# Patient Record
Sex: Female | Born: 1970 | Race: White | Hispanic: No | Marital: Married | Smoking: Never smoker
Health system: Southern US, Community
[De-identification: ages and names within clinical notes are randomized; demographics above are authoritative.]

## PROBLEM LIST (undated history)

## (undated) DIAGNOSIS — I2510 Atherosclerotic heart disease of native coronary artery without angina pectoris: Secondary | ICD-10-CM

## (undated) DIAGNOSIS — I700 Atherosclerosis of aorta: Secondary | ICD-10-CM

## (undated) DIAGNOSIS — G4730 Sleep apnea, unspecified: Secondary | ICD-10-CM

## (undated) DIAGNOSIS — I428 Other cardiomyopathies: Secondary | ICD-10-CM

## (undated) DIAGNOSIS — G4733 Obstructive sleep apnea (adult) (pediatric): Secondary | ICD-10-CM

## (undated) DIAGNOSIS — R51 Headache: Secondary | ICD-10-CM

## (undated) DIAGNOSIS — G43909 Migraine, unspecified, not intractable, without status migrainosus: Secondary | ICD-10-CM

## (undated) DIAGNOSIS — Z7902 Long term (current) use of antithrombotics/antiplatelets: Secondary | ICD-10-CM

## (undated) DIAGNOSIS — E538 Deficiency of other specified B group vitamins: Secondary | ICD-10-CM

## (undated) DIAGNOSIS — Z9884 Bariatric surgery status: Secondary | ICD-10-CM

## (undated) DIAGNOSIS — M1990 Unspecified osteoarthritis, unspecified site: Secondary | ICD-10-CM

## (undated) DIAGNOSIS — A770 Spotted fever due to Rickettsia rickettsii: Secondary | ICD-10-CM

## (undated) DIAGNOSIS — I10 Essential (primary) hypertension: Secondary | ICD-10-CM

## (undated) DIAGNOSIS — D72829 Elevated white blood cell count, unspecified: Secondary | ICD-10-CM

## (undated) DIAGNOSIS — I639 Cerebral infarction, unspecified: Secondary | ICD-10-CM

## (undated) DIAGNOSIS — I509 Heart failure, unspecified: Secondary | ICD-10-CM

## (undated) DIAGNOSIS — N809 Endometriosis, unspecified: Secondary | ICD-10-CM

## (undated) DIAGNOSIS — E785 Hyperlipidemia, unspecified: Secondary | ICD-10-CM

## (undated) DIAGNOSIS — I429 Cardiomyopathy, unspecified: Secondary | ICD-10-CM

## (undated) DIAGNOSIS — R600 Localized edema: Secondary | ICD-10-CM

## (undated) DIAGNOSIS — R609 Edema, unspecified: Secondary | ICD-10-CM

## (undated) DIAGNOSIS — R519 Headache, unspecified: Secondary | ICD-10-CM

## (undated) DIAGNOSIS — R001 Bradycardia, unspecified: Secondary | ICD-10-CM

## (undated) DIAGNOSIS — E669 Obesity, unspecified: Secondary | ICD-10-CM

## (undated) DIAGNOSIS — I4891 Unspecified atrial fibrillation: Secondary | ICD-10-CM

## (undated) HISTORY — PX: OTHER SURGICAL HISTORY: SHX169

## (undated) HISTORY — PX: TEMPOROMANDIBULAR JOINT ARTHROSCOPY: SUR97

## (undated) SURGERY — Surgical Case
Anesthesia: *Unknown

---

## 1995-10-09 HISTORY — PX: TUBAL LIGATION: SHX77

## 2002-10-08 HISTORY — PX: ORIF TIBIA & FIBULA FRACTURES: SHX2131

## 2011-10-09 HISTORY — PX: OTHER SURGICAL HISTORY: SHX169

## 2011-10-09 HISTORY — PX: SUPRACERVICAL ABDOMINAL HYSTERECTOMY: SHX5393

## 2011-10-09 HISTORY — PX: ABDOMINAL HYSTERECTOMY: SHX81

## 2016-12-05 ENCOUNTER — Other Ambulatory Visit: Payer: Self-pay | Admitting: Family Medicine

## 2016-12-05 DIAGNOSIS — Z1239 Encounter for other screening for malignant neoplasm of breast: Secondary | ICD-10-CM

## 2017-01-07 ENCOUNTER — Encounter: Payer: Self-pay | Admitting: Radiology

## 2017-01-07 ENCOUNTER — Ambulatory Visit
Admission: RE | Admit: 2017-01-07 | Discharge: 2017-01-07 | Disposition: A | Discharge status: Home / Self Care | Payer: Commercial Managed Care - PPO | Source: Ambulatory Visit | Attending: Family Medicine | Admitting: Family Medicine

## 2017-01-07 DIAGNOSIS — Z1231 Encounter for screening mammogram for malignant neoplasm of breast: Secondary | ICD-10-CM | POA: Insufficient documentation

## 2017-01-07 DIAGNOSIS — Z1239 Encounter for other screening for malignant neoplasm of breast: Secondary | ICD-10-CM

## 2017-01-07 DIAGNOSIS — R921 Mammographic calcification found on diagnostic imaging of breast: Secondary | ICD-10-CM | POA: Diagnosis not present

## 2017-01-10 ENCOUNTER — Other Ambulatory Visit: Payer: Self-pay | Admitting: Family Medicine

## 2017-01-10 DIAGNOSIS — R921 Mammographic calcification found on diagnostic imaging of breast: Secondary | ICD-10-CM

## 2017-01-10 DIAGNOSIS — R928 Other abnormal and inconclusive findings on diagnostic imaging of breast: Secondary | ICD-10-CM

## 2017-01-18 ENCOUNTER — Ambulatory Visit: Payer: Commercial Managed Care - PPO

## 2017-01-21 ENCOUNTER — Ambulatory Visit
Admission: RE | Admit: 2017-01-21 | Discharge: 2017-01-21 | Disposition: A | Discharge status: Home / Self Care | Payer: Commercial Managed Care - PPO | Source: Ambulatory Visit | Attending: Family Medicine | Admitting: Family Medicine

## 2017-01-21 DIAGNOSIS — R921 Mammographic calcification found on diagnostic imaging of breast: Secondary | ICD-10-CM | POA: Diagnosis not present

## 2017-01-21 DIAGNOSIS — R928 Other abnormal and inconclusive findings on diagnostic imaging of breast: Secondary | ICD-10-CM

## 2017-01-23 ENCOUNTER — Other Ambulatory Visit: Payer: Self-pay | Admitting: Neurology

## 2017-01-23 DIAGNOSIS — G43019 Migraine without aura, intractable, without status migrainosus: Secondary | ICD-10-CM

## 2017-01-23 DIAGNOSIS — R413 Other amnesia: Secondary | ICD-10-CM

## 2017-01-31 ENCOUNTER — Ambulatory Visit
Admission: RE | Admit: 2017-01-31 | Discharge: 2017-01-31 | Disposition: A | Discharge status: Home / Self Care | Payer: Commercial Managed Care - PPO | Source: Ambulatory Visit | Attending: Neurology | Admitting: Neurology

## 2017-09-26 ENCOUNTER — Other Ambulatory Visit: Payer: Self-pay | Admitting: Neurology

## 2017-09-26 DIAGNOSIS — G43019 Migraine without aura, intractable, without status migrainosus: Secondary | ICD-10-CM

## 2017-10-08 DIAGNOSIS — I639 Cerebral infarction, unspecified: Secondary | ICD-10-CM

## 2017-10-08 HISTORY — DX: Cerebral infarction, unspecified: I63.9

## 2017-10-08 HISTORY — PX: CARDIAC CATHETERIZATION: SHX172

## 2017-10-10 ENCOUNTER — Ambulatory Visit: Payer: Commercial Managed Care - PPO

## 2017-10-15 ENCOUNTER — Ambulatory Visit
Admission: RE | Admit: 2017-10-15 | Discharge: 2017-10-15 | Disposition: A | Discharge status: Home / Self Care | Payer: Commercial Managed Care - PPO | Source: Ambulatory Visit | Attending: Internal Medicine | Admitting: Internal Medicine

## 2017-10-15 ENCOUNTER — Encounter
Admission: RE | Disposition: A | Discharge status: Home / Self Care | Payer: Self-pay | Source: Ambulatory Visit | Attending: Internal Medicine

## 2017-10-15 DIAGNOSIS — R079 Chest pain, unspecified: Secondary | ICD-10-CM

## 2017-10-15 DIAGNOSIS — I6340 Cerebral infarction due to embolism of unspecified cerebral artery: Secondary | ICD-10-CM | POA: Diagnosis not present

## 2017-10-15 DIAGNOSIS — E669 Obesity, unspecified: Secondary | ICD-10-CM | POA: Insufficient documentation

## 2017-10-15 DIAGNOSIS — I429 Cardiomyopathy, unspecified: Secondary | ICD-10-CM

## 2017-10-15 DIAGNOSIS — R51 Headache: Secondary | ICD-10-CM

## 2017-10-15 DIAGNOSIS — M79601 Pain in right arm: Secondary | ICD-10-CM | POA: Diagnosis present

## 2017-10-15 DIAGNOSIS — I481 Persistent atrial fibrillation: Secondary | ICD-10-CM

## 2017-10-15 DIAGNOSIS — R0602 Shortness of breath: Secondary | ICD-10-CM | POA: Insufficient documentation

## 2017-10-15 DIAGNOSIS — Z6839 Body mass index (BMI) 39.0-39.9, adult: Secondary | ICD-10-CM | POA: Insufficient documentation

## 2017-10-15 DIAGNOSIS — I4891 Unspecified atrial fibrillation: Secondary | ICD-10-CM | POA: Diagnosis present

## 2017-10-15 DIAGNOSIS — M79602 Pain in left arm: Secondary | ICD-10-CM | POA: Diagnosis present

## 2017-10-15 DIAGNOSIS — Z7982 Long term (current) use of aspirin: Secondary | ICD-10-CM

## 2017-10-15 DIAGNOSIS — Z90710 Acquired absence of both cervix and uterus: Secondary | ICD-10-CM

## 2017-10-15 DIAGNOSIS — Z91040 Latex allergy status: Secondary | ICD-10-CM

## 2017-10-15 DIAGNOSIS — Z79899 Other long term (current) drug therapy: Secondary | ICD-10-CM | POA: Insufficient documentation

## 2017-10-15 DIAGNOSIS — I639 Cerebral infarction, unspecified: Secondary | ICD-10-CM | POA: Diagnosis not present

## 2017-10-15 DIAGNOSIS — M542 Cervicalgia: Secondary | ICD-10-CM | POA: Diagnosis present

## 2017-10-15 DIAGNOSIS — Z8249 Family history of ischemic heart disease and other diseases of the circulatory system: Secondary | ICD-10-CM | POA: Insufficient documentation

## 2017-10-15 DIAGNOSIS — Z91038 Other insect allergy status: Secondary | ICD-10-CM

## 2017-10-15 DIAGNOSIS — R05 Cough: Secondary | ICD-10-CM

## 2017-10-15 DIAGNOSIS — G4730 Sleep apnea, unspecified: Secondary | ICD-10-CM | POA: Diagnosis present

## 2017-10-15 DIAGNOSIS — Z7901 Long term (current) use of anticoagulants: Secondary | ICD-10-CM

## 2017-10-15 DIAGNOSIS — R29700 NIHSS score 0: Secondary | ICD-10-CM | POA: Diagnosis present

## 2017-10-15 DIAGNOSIS — Z803 Family history of malignant neoplasm of breast: Secondary | ICD-10-CM

## 2017-10-15 DIAGNOSIS — I10 Essential (primary) hypertension: Secondary | ICD-10-CM | POA: Diagnosis present

## 2017-10-15 HISTORY — DX: Headache, unspecified: R51.9

## 2017-10-15 HISTORY — DX: Headache: R51

## 2017-10-15 HISTORY — PX: LEFT HEART CATH AND CORONARY ANGIOGRAPHY: CATH118249

## 2017-10-15 LAB — CARDIAC CATHETERIZATION: CATHEFQUANT: 40 %

## 2017-10-15 SURGERY — LEFT HEART CATH AND CORONARY ANGIOGRAPHY
Anesthesia: Moderate Sedation

## 2017-10-15 SURGERY — LEFT HEART CATH AND CORONARY ANGIOGRAPHY
Anesthesia: Moderate Sedation | Laterality: Left

## 2017-10-15 MED ORDER — SODIUM CHLORIDE 0.9 % WEIGHT BASED INFUSION
350.0000 mL/h | INTRAVENOUS | Status: AC
  Administered 2017-10-15: 3 mL/kg/h via INTRAVENOUS

## 2017-10-15 MED ORDER — FENTANYL CITRATE (PF) 100 MCG/2ML IJ SOLN
INTRAMUSCULAR | Status: DC | PRN
  Administered 2017-10-15: 25 ug via INTRAVENOUS

## 2017-10-15 MED ORDER — SODIUM CHLORIDE 0.9 % WEIGHT BASED INFUSION: 1.0000 mL/kg/h | INTRAVENOUS | Status: DC

## 2017-10-15 MED ORDER — SODIUM CHLORIDE 0.9% FLUSH: 3.0000 mL | Freq: Two times a day (BID) | INTRAVENOUS | Status: DC

## 2017-10-15 MED ORDER — ONDANSETRON HCL 4 MG/2ML IJ SOLN: 4.0000 mg | Freq: Four times a day (QID) | INTRAMUSCULAR | Status: DC | PRN

## 2017-10-15 MED ORDER — FENTANYL CITRATE (PF) 100 MCG/2ML IJ SOLN
INTRAMUSCULAR | Status: AC
  Filled 2017-10-15: qty 2

## 2017-10-15 MED ORDER — SODIUM CHLORIDE 0.9 % IV SOLN: 250.0000 mL | INTRAVENOUS | Status: DC | PRN

## 2017-10-15 MED ORDER — ACETAMINOPHEN 325 MG PO TABS: 650.0000 mg | ORAL_TABLET | ORAL | Status: DC | PRN

## 2017-10-15 MED ORDER — MIDAZOLAM HCL 2 MG/2ML IJ SOLN
INTRAMUSCULAR | Status: DC | PRN
  Administered 2017-10-15: 1 mg via INTRAVENOUS

## 2017-10-15 MED ORDER — SODIUM CHLORIDE 0.9% FLUSH: 3.0000 mL | INTRAVENOUS | Status: DC | PRN

## 2017-10-15 MED ORDER — ASPIRIN 81 MG PO CHEW: 81.0000 mg | CHEWABLE_TABLET | ORAL | Status: DC

## 2017-10-15 MED ORDER — MIDAZOLAM HCL 2 MG/2ML IJ SOLN
INTRAMUSCULAR | Status: AC
  Filled 2017-10-15: qty 2

## 2017-10-15 MED ORDER — IOPAMIDOL (ISOVUE-300) INJECTION 61%
INTRAVENOUS | Status: DC | PRN
  Administered 2017-10-15: 95 mL via INTRA_ARTERIAL

## 2017-10-15 SURGICAL SUPPLY — 9 items
CATH INFINITI 5FR ANG PIGTAIL (CATHETERS) ×3 IMPLANT
CATH INFINITI 5FR JL4 (CATHETERS) ×3 IMPLANT
CATH INFINITI JR4 5F (CATHETERS) ×3 IMPLANT
DEVICE CLOSURE MYNXGRIP 5F (Vascular Products) ×3 IMPLANT
KIT MANI 3VAL PERCEP (MISCELLANEOUS) ×3 IMPLANT
NEEDLE PERC 18GX7CM (NEEDLE) ×3 IMPLANT
PACK CARDIAC CATH (CUSTOM PROCEDURE TRAY) ×3 IMPLANT
SHEATH AVANTI 5FR X 11CM (SHEATH) ×3 IMPLANT
WIRE EMERALD 3MM-J .035X150CM (WIRE) ×3 IMPLANT

## 2017-10-16 ENCOUNTER — Other Ambulatory Visit: Payer: Self-pay | Admitting: Internal Medicine

## 2017-10-16 ENCOUNTER — Encounter: Payer: Self-pay | Admitting: Internal Medicine

## 2017-10-16 DIAGNOSIS — R0602 Shortness of breath: Secondary | ICD-10-CM

## 2017-10-17 ENCOUNTER — Ambulatory Visit
Admission: RE | Admit: 2017-10-17 | Discharge: 2017-10-17 | Disposition: A | Discharge status: Home / Self Care | Payer: Commercial Managed Care - PPO | Source: Ambulatory Visit | Attending: Internal Medicine | Admitting: Internal Medicine

## 2017-10-17 DIAGNOSIS — I517 Cardiomegaly: Secondary | ICD-10-CM | POA: Insufficient documentation

## 2017-10-17 DIAGNOSIS — R0602 Shortness of breath: Secondary | ICD-10-CM

## 2017-10-17 MED ORDER — IOPAMIDOL (ISOVUE-370) INJECTION 76%
75.0000 mL | Freq: Once | INTRAVENOUS | Status: AC | PRN
  Administered 2017-10-17: 75 mL via INTRAVENOUS

## 2017-10-18 ENCOUNTER — Inpatient Hospital Stay: Payer: Commercial Managed Care - PPO

## 2017-10-18 ENCOUNTER — Other Ambulatory Visit: Payer: Self-pay

## 2017-10-18 ENCOUNTER — Encounter: Payer: Self-pay | Admitting: Emergency Medicine

## 2017-10-18 ENCOUNTER — Ambulatory Visit
Admission: RE | Admit: 2017-10-18 | Discharge: 2017-10-18 | Disposition: A | Discharge status: Home / Self Care | Payer: Commercial Managed Care - PPO | Source: Ambulatory Visit | Attending: Neurology | Admitting: Neurology

## 2017-10-18 ENCOUNTER — Inpatient Hospital Stay
Admission: EM | Admit: 2017-10-18 | Discharge: 2017-10-19 | DRG: 065 | Disposition: A | Discharge status: Home / Self Care | Payer: Commercial Managed Care - PPO | Attending: Internal Medicine | Admitting: Internal Medicine

## 2017-10-18 DIAGNOSIS — I639 Cerebral infarction, unspecified: Secondary | ICD-10-CM | POA: Diagnosis present

## 2017-10-18 DIAGNOSIS — Z91038 Other insect allergy status: Secondary | ICD-10-CM | POA: Diagnosis not present

## 2017-10-18 DIAGNOSIS — I429 Cardiomyopathy, unspecified: Secondary | ICD-10-CM | POA: Diagnosis present

## 2017-10-18 DIAGNOSIS — Z91040 Latex allergy status: Secondary | ICD-10-CM | POA: Diagnosis not present

## 2017-10-18 DIAGNOSIS — Z7901 Long term (current) use of anticoagulants: Secondary | ICD-10-CM | POA: Diagnosis not present

## 2017-10-18 DIAGNOSIS — R29700 NIHSS score 0: Secondary | ICD-10-CM | POA: Diagnosis present

## 2017-10-18 DIAGNOSIS — G43019 Migraine without aura, intractable, without status migrainosus: Secondary | ICD-10-CM

## 2017-10-18 DIAGNOSIS — M542 Cervicalgia: Secondary | ICD-10-CM | POA: Diagnosis present

## 2017-10-18 DIAGNOSIS — Z7982 Long term (current) use of aspirin: Secondary | ICD-10-CM | POA: Diagnosis not present

## 2017-10-18 DIAGNOSIS — Z90710 Acquired absence of both cervix and uterus: Secondary | ICD-10-CM | POA: Diagnosis not present

## 2017-10-18 DIAGNOSIS — I4891 Unspecified atrial fibrillation: Secondary | ICD-10-CM | POA: Diagnosis present

## 2017-10-18 DIAGNOSIS — R42 Dizziness and giddiness: Secondary | ICD-10-CM

## 2017-10-18 DIAGNOSIS — M79601 Pain in right arm: Secondary | ICD-10-CM | POA: Diagnosis present

## 2017-10-18 DIAGNOSIS — I10 Essential (primary) hypertension: Secondary | ICD-10-CM | POA: Diagnosis present

## 2017-10-18 DIAGNOSIS — Z803 Family history of malignant neoplasm of breast: Secondary | ICD-10-CM | POA: Diagnosis not present

## 2017-10-18 DIAGNOSIS — G4730 Sleep apnea, unspecified: Secondary | ICD-10-CM | POA: Diagnosis present

## 2017-10-18 DIAGNOSIS — I6340 Cerebral infarction due to embolism of unspecified cerebral artery: Secondary | ICD-10-CM | POA: Diagnosis present

## 2017-10-18 DIAGNOSIS — M79602 Pain in left arm: Secondary | ICD-10-CM | POA: Diagnosis present

## 2017-10-18 HISTORY — DX: Essential (primary) hypertension: I10

## 2017-10-18 HISTORY — DX: Unspecified atrial fibrillation: I48.91

## 2017-10-18 HISTORY — DX: Cerebral infarction, unspecified: I63.9

## 2017-10-18 HISTORY — DX: Heart failure, unspecified: I50.9

## 2017-10-18 HISTORY — DX: Sleep apnea, unspecified: G47.30

## 2017-10-18 LAB — URINALYSIS, ROUTINE W REFLEX MICROSCOPIC
Bilirubin Urine: NEGATIVE
Glucose, UA: NEGATIVE mg/dL
HGB URINE DIPSTICK: NEGATIVE
Ketones, ur: NEGATIVE mg/dL
Leukocytes, UA: NEGATIVE
Nitrite: NEGATIVE
PH: 7 (ref 5.0–8.0)
Protein, ur: NEGATIVE mg/dL
SPECIFIC GRAVITY, URINE: 1.006 (ref 1.005–1.030)

## 2017-10-18 LAB — LIPID PANEL
CHOL/HDL RATIO: 5.2 ratio
CHOLESTEROL: 194 mg/dL (ref 0–200)
HDL: 37 mg/dL — ABNORMAL LOW (ref >40)
LDL CALC: 122 mg/dL — AB (ref 0–99)
TRIGLYCERIDES: 175 mg/dL — AB (ref <150)
VLDL: 35 mg/dL (ref 0–40)

## 2017-10-18 LAB — DIFFERENTIAL
BASOS ABS: 0 10*3/uL (ref 0–0.1)
BASOS PCT: 1 %
Eosinophils Absolute: 0.2 10*3/uL (ref 0–0.7)
Eosinophils Relative: 3 %
LYMPHS PCT: 29 %
Lymphs Abs: 2.6 10*3/uL (ref 1.0–3.6)
Monocytes Absolute: 0.4 10*3/uL (ref 0.2–0.9)
Monocytes Relative: 4 %
NEUTROS PCT: 63 %
Neutro Abs: 5.6 10*3/uL (ref 1.4–6.5)

## 2017-10-18 LAB — TROPONIN I: Troponin I: <0.03 ng/mL (ref <0.03)

## 2017-10-18 LAB — COMPREHENSIVE METABOLIC PANEL
ALBUMIN: 4.2 g/dL (ref 3.5–5.0)
ALT: 19 U/L (ref 14–54)
AST: 25 U/L (ref 15–41)
Alkaline Phosphatase: 72 U/L (ref 38–126)
Anion gap: 9 (ref 5–15)
BUN: 17 mg/dL (ref 6–20)
CHLORIDE: 100 mmol/L — AB (ref 101–111)
CO2: 28 mmol/L (ref 22–32)
CREATININE: 0.81 mg/dL (ref 0.44–1.00)
Calcium: 9.3 mg/dL (ref 8.9–10.3)
GFR calc Af Amer: >60 mL/min (ref >60)
GFR calc non Af Amer: >60 mL/min (ref >60)
GLUCOSE: 139 mg/dL — AB (ref 65–99)
Potassium: 3.7 mmol/L (ref 3.5–5.1)
SODIUM: 137 mmol/L (ref 135–145)
Total Bilirubin: 1 mg/dL (ref 0.3–1.2)
Total Protein: 7.7 g/dL (ref 6.5–8.1)

## 2017-10-18 LAB — CBC
HCT: 43.6 % (ref 35.0–47.0)
Hemoglobin: 14.3 g/dL (ref 12.0–16.0)
MCH: 28.1 pg (ref 26.0–34.0)
MCHC: 32.8 g/dL (ref 32.0–36.0)
MCV: 85.9 fL (ref 80.0–100.0)
PLATELETS: 278 10*3/uL (ref 150–440)
RBC: 5.08 MIL/uL (ref 3.80–5.20)
RDW: 14.3 % (ref 11.5–14.5)
WBC: 8.9 10*3/uL (ref 3.6–11.0)

## 2017-10-18 LAB — POCT PREGNANCY, URINE: Preg Test, Ur: NEGATIVE

## 2017-10-18 LAB — APTT: APTT: 30 s (ref 24–36)

## 2017-10-18 LAB — GLUCOSE, CAPILLARY: Glucose-Capillary: 168 mg/dL — ABNORMAL HIGH (ref 65–99)

## 2017-10-18 LAB — PROTIME-INR
INR: 1.04
PROTHROMBIN TIME: 13.5 s (ref 11.4–15.2)

## 2017-10-18 MED ORDER — ASPIRIN EC 81 MG PO TBEC
81.0000 mg | DELAYED_RELEASE_TABLET | Freq: Every day | ORAL | Status: DC
  Administered 2017-10-19: 10:00:00 81 mg via ORAL
  Filled 2017-10-18: qty 1

## 2017-10-18 MED ORDER — DIPHENHYDRAMINE HCL 25 MG PO CAPS: 25.0000 mg | ORAL_CAPSULE | Freq: Every day | ORAL | Status: DC | PRN

## 2017-10-18 MED ORDER — ACETAMINOPHEN 650 MG RE SUPP: 650.0000 mg | RECTAL | Status: DC | PRN

## 2017-10-18 MED ORDER — ACETAMINOPHEN 500 MG PO TABS: 500.0000 mg | ORAL_TABLET | Freq: Every day | ORAL | Status: DC | PRN

## 2017-10-18 MED ORDER — DIPHENHYDRAMINE-APAP (SLEEP) 25-500 MG PO TABS: 3.0000 | ORAL_TABLET | Freq: Every day | ORAL | Status: DC | PRN

## 2017-10-18 MED ORDER — ATORVASTATIN CALCIUM 20 MG PO TABS
40.0000 mg | ORAL_TABLET | Freq: Every day | ORAL | Status: DC
  Administered 2017-10-18: 40 mg via ORAL
  Filled 2017-10-18: qty 2

## 2017-10-18 MED ORDER — VITAMIN B-12 1000 MCG PO TABS
1000.0000 ug | ORAL_TABLET | Freq: Every day | ORAL | Status: DC
  Administered 2017-10-19: 10:00:00 1000 ug via ORAL
  Filled 2017-10-18: qty 1

## 2017-10-18 MED ORDER — APIXABAN 5 MG PO TABS
5.0000 mg | ORAL_TABLET | Freq: Two times a day (BID) | ORAL | Status: DC
  Administered 2017-10-18 – 2017-10-19 (×2): 5 mg via ORAL
  Filled 2017-10-18 (×2): qty 1

## 2017-10-18 MED ORDER — METOPROLOL TARTRATE 50 MG PO TABS
50.0000 mg | ORAL_TABLET | Freq: Two times a day (BID) | ORAL | Status: DC
  Filled 2017-10-18: qty 1

## 2017-10-18 MED ORDER — ACETAMINOPHEN 160 MG/5ML PO SOLN
650.0000 mg | ORAL | Status: DC | PRN
  Filled 2017-10-18: qty 20.3

## 2017-10-18 MED ORDER — ACETAMINOPHEN 325 MG PO TABS: 650.0000 mg | ORAL_TABLET | ORAL | Status: DC | PRN

## 2017-10-18 MED ORDER — MAGNESIUM OXIDE 400 (241.3 MG) MG PO TABS
400.0000 mg | ORAL_TABLET | Freq: Every day | ORAL | Status: DC
  Administered 2017-10-18: 23:00:00 400 mg via ORAL
  Filled 2017-10-18: qty 1

## 2017-10-18 MED ORDER — GADOBENATE DIMEGLUMINE 529 MG/ML IV SOLN
20.0000 mL | Freq: Once | INTRAVENOUS | Status: AC | PRN
  Administered 2017-10-18: 20 mL via INTRAVENOUS

## 2017-10-18 MED ORDER — STROKE: EARLY STAGES OF RECOVERY BOOK
Freq: Once | Status: AC
  Administered 2017-10-18: 20:00:00

## 2017-10-18 MED ORDER — SENNOSIDES-DOCUSATE SODIUM 8.6-50 MG PO TABS: 1.0000 | ORAL_TABLET | Freq: Every evening | ORAL | Status: DC | PRN

## 2017-10-18 NOTE — ED Triage Notes (Addendum)
Pt arrived via POV, had MRI today that was positive for a stroke.  Pt ambulatory in triage at this time.  Pt has hx of migraines. Pt had heart cath on Monday,   Pt has had left eye twitching for a few days, pt has new onset afib recently diagnosed in Nov, pt has had sxs of lightheadeness and dizzy for the past few months.   No new neuro symptoms today.
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
## 2017-10-18 NOTE — ED Notes (Signed)
Report to Butch, RN 

## 2017-10-18 NOTE — ED Notes (Signed)
Pt reporting intermittent headaches, eye twitching over past few weeks. Had MRI today, called to come back to ER. Denies weakness in any extremity. Pt alert and oriented X4, active, cooperative, pt in NAD. RR even and unlabored, color WNL.  Recent dx of a-fib; started on eliquis today. Recent heart catheterization. Reporting exertional SOB with ambulation.

## 2017-10-18 NOTE — Progress Notes (Signed)
ANTICOAGULATION CONSULT NOTE - Initial Consult  Pharmacy Consult for Apixaban Indication: atrial fibrillation  Allergies  Allergen Reactions  . Wasp Venom Shortness Of Breath and Swelling  . Latex Swelling and Other (See Comments)    Burning, red, itchy, puffiness.     Patient Measurements: Height: 5\' 8"  (172.7 cm) Weight: 260 lb (117.9 kg) IBW/kg (Calculated) : 63.9 Heparin Dosing Weight:   Vital Signs: Temp: 97.6 F (36.4 C) (01/11 1732) Temp Source: Oral (01/11 1701) BP: 112/61 (01/11 1930) Pulse Rate: 108 (01/11 1930)  Labs: Recent Labs    10/18/17 1722  HGB 14.3  HCT 43.6  PLT 278  APTT 30  LABPROT 13.5  INR 1.04  CREATININE 0.81  TROPONINI <0.03    Estimated Creatinine Clearance: 114.6 mL/min (by C-G formula based on SCr of 0.81 mg/dL).   Medical History: Past Medical History:  Diagnosis Date  . Atrial fibrillation ([place])   . CHF (congestive heart failure) ([place])   . Headache   . Hypertension   . Sleep apnea   . Stroke [facility])     Medications:  Patient recently started apixaban 5mg  BID. Is also on aspirin 81mg  at home.   Assessment: 47 yo female presents to ED for evaluation of stroke per her neurologist after noting an acute cerebellar stroke on an MRI. Patient was recently diagnosed with nonvalvular a fib and started taking apixaban today on 1/11. Patient has been admitted for further workup.   Goal of Therapy:  Monitor platelets by anticoagulation protocol: Yes   Plan:  Will start apixaban 5mg  BID. Pharmacy will continue to monitor.   Lendon Ka, PharmD Pharmacy Resident 10/18/2017,8:03 PM
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
## 2017-10-18 NOTE — ED Notes (Signed)
Pt up to bathroom while this RN in another room, pt unable to catch sample. EDP aware.

## 2017-10-19 ENCOUNTER — Inpatient Hospital Stay: Payer: Commercial Managed Care - PPO

## 2017-10-19 DIAGNOSIS — I639 Cerebral infarction, unspecified: Secondary | ICD-10-CM

## 2017-10-19 LAB — HEMOGLOBIN A1C
Hgb A1c MFr Bld: 5.6 % (ref 4.8–5.6)
MEAN PLASMA GLUCOSE: 114.02 mg/dL

## 2017-10-19 MED ORDER — ATORVASTATIN CALCIUM 40 MG PO TABS: 40.0000 mg | ORAL_TABLET | Freq: Every day | ORAL | 0 refills | Status: DC

## 2017-10-19 MED ORDER — METOPROLOL TARTRATE 25 MG PO TABS
25.0000 mg | ORAL_TABLET | Freq: Two times a day (BID) | ORAL | Status: DC
  Administered 2017-10-19: 11:00:00 25 mg via ORAL

## 2017-10-19 MED ORDER — AMIODARONE HCL 200 MG PO TABS
200.0000 mg | ORAL_TABLET | Freq: Two times a day (BID) | ORAL | Status: DC
  Administered 2017-10-19: 11:00:00 200 mg via ORAL
  Filled 2017-10-19: qty 1

## 2017-10-19 NOTE — Progress Notes (Signed)
SLP Cancellation Note  Patient Details Name: Taiwana Willison MRN: [id number] DOB: 1971/02/10   Cancelled treatment:       Reason Eval/Treat Not Completed: Patient at procedure or test/unavailable(will reattempt; NSG reported no deficits)   Orinda Kenner, [place], CCC-SLP [place]chan Gal 10/19/2017, 10:25 AM

## 2017-10-19 NOTE — Progress Notes (Signed)
Discharge instructions reviewed with patient and husband. No questions or concerns at this time. plan to f/u with cardiology on Monday. Will call Monday to schedule neurology follow up appointment. IV d/c. site free of s/s of infection. Patient discharged to lobby with husband.
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
## 2017-10-19 NOTE — Discharge Summary (Signed)
[facility] NAME: Michelle Norris    MR#:  [id number]  DATE OF BIRTH:  1971-06-07  DATE OF ADMISSION:  10/18/2017 ADMITTING PHYSICIAN: Vaughan Basta, MD  DATE OF DISCHARGE: 10/19/17  PRIMARY CARE PHYSICIAN: Marinda Elk, MD    ADMISSION DIAGNOSIS:  Cerebrovascular accident (CVA), unspecified mechanism ([place]) [I63.9]  DISCHARGE DIAGNOSIS:  Principal Problem:   Stroke [facility])   SECONDARY DIAGNOSIS:   Past Medical History:  Diagnosis Date  . Atrial fibrillation ([place])   . CHF (congestive heart failure) ([place])   . Headache   . Hypertension   . Sleep apnea   . Stroke [facility])     HOSPITAL COURSE:  HISTORY OF PRESENT ILLNESS: Michelle Norris  is a 46 y.o. female with a known history of A fib and Cardiomyopathy with EF 35% ( Diagnosed 1 month ago), Htn, Sleep apnea- all work up started since thanks giving holidays as she had dizziness and SOB with minimal exertion. CT angio to r/o PE and Cardiac cath to r/o any CAD is done in last week.  As a part of work up, also ordered MRI brain a few months ago, so done today and received a call from Dr. Trena Platt office, telling her to go to ER as she have a stroke. She does not have any new symptoms. 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  Patient also reports that she just had echocardiogram done 1 month ago which has revealed ejection fraction 25%.  Will recommend patient to follow-up with the cardiology-[place] in 1 week Okay to discharge patient from neurology standpoint with p.o.  Eliquis- PT has recommended outpatient cardiac rehabilitation   Hemoglobin A1c 5.6 and LDL 122  Start high intensity statin   Already on ASA , and took first dose of eliquis today.  Continue Eliquis Outpatient follow-up with neurology Dr. Manuella Ghazi  * A fib   Cont metoprolol, amiodarone and Eliquis.  * Cardiomyopathy    Cath is done 2 days ago.    Monitor. Cont betablocker    If BP stable, may add lisinopril.  * Sleep apnea   CPAP.  * c/o both arm pain and burning sensation   Could it be due to  due to spinal stenosis. if does not improve possible MRI C spine but more likely EMG if doesn't resolve as shooting pain neck and L shoulder that is chronic     I    DISCHARGE CONDITIONS:   stable  CONSULTS OBTAINED:  Treatment Team:  Leotis Pain, MD   PROCEDURES none   DRUG ALLERGIES:   Allergies  Allergen Reactions  . Wasp Venom Shortness Of Breath and Swelling  . Latex Swelling and Other (See Comments)    Burning, red, itchy, puffiness.     DISCHARGE MEDICATIONS:   Allergies as of 10/19/2017      Reactions   Wasp Venom Shortness Of Breath, Swelling   Latex Swelling, Other (See Comments)   Burning, red, itchy, puffiness.       Medication List    TAKE these medications   amiodarone  200 MG tablet Commonly known as:  PACERONE Take 200-400 mg by mouth as directed. Take 1 tablet by mouth twice daily for 2 weeks then take 1 tablet by mouth daily   aspirin EC 81 MG tablet Take 81 mg by mouth daily.   atorvastatin 40 MG tablet Commonly known as:  LIPITOR Take 1 tablet (40 mg total) by mouth daily at 6 PM.   diphenhydrAMINE 25 mg capsule Commonly known as:  BENADRYL Take 25 mg by mouth every 6 (six) hours as needed (for sinus issues or allergies.).   diphenhydramine-acetaminophen 25-500 MG Tabs tablet Commonly known as:  TYLENOL PM Take 3 tablets by mouth daily as needed (for migraine headaches).   ELIQUIS 5 MG Tabs tablet Generic drug:   apixaban Take 5 mg by mouth 2 (two) times daily.   magnesium oxide 400 MG tablet Commonly known as:  MAG-OX Take 400 mg by mouth at bedtime.   metoprolol tartrate 50 MG tablet Commonly known as:  LOPRESSOR Take 50 mg by mouth 2 (two) times daily. (0800 & 2000)   vitamin B-12 1000 MCG tablet Commonly known as:  CYANOCOBALAMIN Take 1,000 mcg by mouth daily.        DISCHARGE INSTRUCTIONS:   Follow-up with primary care physician in a week \\follow -up with neurology Dr. Manuella Ghazi in 2 weeks Follow-up with cardiology Dr. Clayborn Bigness in 1 week Outpatient cardiac rehabilitation follow-up in 3-5 days  DIET:  Cardiac diet  DISCHARGE CONDITION:  Stable  ACTIVITY:  Activity as tolerated  OXYGEN:  Home Oxygen: No.   Oxygen Delivery: room air  DISCHARGE LOCATION:  home   If you experience worsening of your admission symptoms, develop shortness of breath, life threatening emergency, suicidal or homicidal thoughts you must seek medical attention immediately by calling 911 or calling your MD immediately  if symptoms less severe.  You Must read complete instructions/literature along with all the possible adverse reactions/side effects for all the Medicines you take and that have been prescribed to you. Take any new Medicines after you have completely understood and accpet all the possible adverse reactions/side effects.   Please note  You were cared for by a hospitalist during your hospital stay. If you have any questions about your discharge medications or the care you received while you were in the hospital after you are discharged, you can call the unit and asked to speak with the hospitalist on call if the hospitalist that took care of you is not available. Once you are discharged, your primary care physician will handle any further medical issues. Please note that NO REFILLS for any discharge medications will be authorized once you are discharged, as it is imperative that you return to  your primary care physician (or establish a relationship with a primary care physician if you do not have one) for your aftercare needs so that they can reassess your need for medications and monitor your lab values.     Today  Chief Complaint  Patient presents with  . Cerebrovascular Accident   Patient is feeling fine.  She has chronic dizziness for 2 months denies any other complaints and wants to go home.  Tolerating diet.  Okay to discharge patient from neurology standpoint, as patient just had a cardiac cath done with EF 35% no need get echocardiogram per neurology  ROS:  CONSTITUTIONAL: Denies fevers, chills. Denies any fatigue, weakness.  EYES: Denies blurry vision, double vision, eye pain. EARS, NOSE, THROAT: Denies tinnitus, ear pain, hearing loss. RESPIRATORY: Denies cough,  wheeze, shortness of breath.  CARDIOVASCULAR: Denies chest pain, palpitations, edema.  GASTROINTESTINAL: Denies nausea, vomiting, diarrhea, abdominal pain. Denies bright red blood per rectum. GENITOURINARY: Denies dysuria, hematuria. ENDOCRINE: Denies nocturia or thyroid problems. HEMATOLOGIC AND LYMPHATIC: Denies easy bruising or bleeding. SKIN: Denies rash or lesion. MUSCULOSKELETAL: Denies pain in neck, back, shoulder, knees, hips or arthritic symptoms.  NEUROLOGIC: Denies paralysis, paresthesias.  PSYCHIATRIC: Denies anxiety or depressive symptoms.   VITAL SIGNS:  Blood pressure 115/83, pulse (!) 110, temperature 97.7 F (36.5 C), temperature source Oral, resp. rate 18, height 5\' 8"  (1.727 m), weight 120.4 kg (265 lb 8 oz), SpO2 93 %.  I/O:    Intake/Output Summary (Last 24 hours) at 10/19/2017 1420 Last data filed at 10/19/2017 1030 Gross per 24 hour  Intake 120 ml  Output 0 ml  Net 120 ml    PHYSICAL EXAMINATION:  GENERAL:  47 y.o.-year-old patient lying in the bed with no acute distress.  EYES: Pupils equal, round, reactive to light and accommodation. No scleral icterus. Extraocular  muscles intact.  HEENT: Head atraumatic, normocephalic. Oropharynx and nasopharynx clear.  NECK:  Supple, no jugular venous distention. No thyroid enlargement, no tenderness.  LUNGS: Normal breath sounds bilaterally, no wheezing, rales,rhonchi or crepitation. No use of accessory muscles of respiration.  CARDIOVASCULAR: S1, S2 normal. No murmurs, rubs, or gallops.  ABDOMEN: Soft, non-tender, non-distended. Bowel sounds present. No organomegaly or mass.  EXTREMITIES: No pedal edema, cyanosis, or clubbing.  NEUROLOGIC: Cranial nerves II through XII are intact. Muscle strength 5/5 in all extremities. Sensation intact. Gait not checked.  PSYCHIATRIC: The patient is alert and oriented x 3.  SKIN: No obvious rash, lesion, or ulcer.   DATA REVIEW:   CBC Recent Labs  Lab 10/18/17 1722  WBC 8.9  HGB 14.3  HCT 43.6  PLT 278    Chemistries  Recent Labs  Lab 10/18/17 1722  NA 137  K 3.7  CL 100*  CO2 28  GLUCOSE 139*  BUN 17  CREATININE 0.81  CALCIUM 9.3  AST 25  ALT 19  ALKPHOS 72  BILITOT 1.0    Cardiac Enzymes Recent Labs  Lab 10/18/17 1722  TROPONINI <0.03    Microbiology Results  No results found for this or any previous visit.  RADIOLOGY:  Dg Chest 2 View  Result Date: 10/18/2017 CLINICAL DATA:  Dizziness EXAM: CHEST  2 VIEW COMPARISON:  None. FINDINGS: No acute pulmonary infiltrate or effusion. Cardiomediastinal silhouette within normal limits. No pneumothorax. IMPRESSION: No active cardiopulmonary disease. Electronically Signed   By: Donavan Foil M.D.   On: 10/18/2017 21:44   Ct Angio Chest Pe W Or Wo Contrast  Result Date: 10/17/2017 CLINICAL DATA:  Cough, shortness of breath and atrial fibrillation. EXAM: CT ANGIOGRAPHY CHEST WITH CONTRAST TECHNIQUE: Multidetector CT imaging of the chest was performed using the standard protocol during bolus administration of intravenous contrast. Multiplanar CT image reconstructions and MIPs were obtained to evaluate the  vascular anatomy. CONTRAST:  92mL ISOVUE-370 IOPAMIDOL (ISOVUE-370) INJECTION 76% COMPARISON:  None. FINDINGS: Cardiovascular: The pulmonary arteries are adequately opacified. There is no evidence of pulmonary embolism. Central pulmonary arteries are normal in caliber. The heart is mildly enlarged. The left ventricular cavity appears mildly dilated. No pericardial effusion. No significant calcified coronary artery plaque identified. The thoracic aorta is normal in caliber without aneurysmal disease. Mediastinum/Nodes: No enlarged mediastinal, hilar, or axillary lymph nodes. Thyroid gland, trachea, and esophagus demonstrate no significant findings. Lungs/Pleura: Probable mild pulmonary venous hypertensive changes based on  caliber of pulmonary veins without evidence of overt edema. There is no evidence of airspace consolidation, pneumothorax, nodule or pleural fluid. Upper Abdomen: No acute abnormality. Musculoskeletal: No chest wall abnormality. No acute or significant osseous findings. Review of the MIP images confirms the above findings. IMPRESSION: 1. No evidence of pulmonary embolism. 2. Cardiac enlargement.  The left ventricle appears mildly dilated. 3. Probable mild pulmonary venous hypertensive changes without overt airspace edema. Electronically Signed   By: Aletta Edouard M.D.   On: 10/17/2017 13:51   Mr Jeri Cos HQ Contrast  Result Date: 10/18/2017 CLINICAL DATA:  Common migraine with intractable migraine EXAM: MRI HEAD WITHOUT AND WITH CONTRAST TECHNIQUE: Multiplanar, multiecho pulse sequences of the brain and surrounding structures were obtained without and with intravenous contrast. CONTRAST:  6mL MULTIHANCE GADOBENATE DIMEGLUMINE 529 MG/ML IV SOLN COMPARISON:  None. FINDINGS: Brain: Acute infarct right superior cerebellum measuring 5 mm. No other acute or chronic infarction. Negative for hemorrhage, mass, or demyelinating disease. No fluid collection or midline shift. Ventricle size normal.  Vascular: Normal arterial flow voids. Skull and upper cervical spine: Negative Sinuses/Orbits: Negative Other: None IMPRESSION: 5 mm acute infarct right superior cerebellum.  Otherwise negative. These results will be called to the ordering clinician or representative by the Radiologist Assistant, and communication documented in the PACS or zVision Dashboard. Electronically Signed   By: Franchot Gallo M.D.   On: 10/18/2017 16:19   US Carotid Bilateral (at [facility] Only)  Result Date: 10/19/2017 CLINICAL DATA:  Right cerebellar infarction. Dizziness, atrial fibrillation and hypertension. EXAM: BILATERAL CAROTID DUPLEX ULTRASOUND TECHNIQUE: Pearline Cables scale imaging, color Doppler and duplex ultrasound were performed of bilateral carotid and vertebral arteries in the neck. COMPARISON:  None. FINDINGS: Criteria: Quantification of carotid stenosis is based on velocity parameters that correlate the residual internal carotid diameter with NASCET-based stenosis levels, using the diameter of the distal internal carotid lumen as the denominator for stenosis measurement. The following velocity measurements were obtained: RIGHT ICA:  183/44 proximal, 102/44 mid cm/sec CCA:  469/62 cm/sec SYSTOLIC ICA/CCA RATIO:  1.2 DIASTOLIC ICA/CCA RATIO:  1.3 ECA:  155 cm/sec LEFT ICA:  137/49 mid, 85/27 proximal cm/sec CCA:  952/84 cm/sec SYSTOLIC ICA/CCA RATIO:  0.9 DIASTOLIC ICA/CCA RATIO:  1.7 ECA:  131 cm/sec RIGHT CAROTID ARTERY: The heart rate was irregular and velocity peaks also quite variable. No focal plaque is identified and there is no evidence of carotid stenosis. It is difficult to accurately measure velocities in the carotid system due to significant variability of peak systolic velocity with heart rate. RIGHT VERTEBRAL ARTERY: Antegrade flow with normal waveform and velocity. LEFT CAROTID ARTERY: Similarly, no evidence of focal plaque or carotid stenosis in the left neck. Highly variable systolic velocity peaks with  irregular heart rate. LEFT VERTEBRAL ARTERY: Antegrade flow with normal waveform and velocity. IMPRESSION: No evidence of atherosclerotic plaque in the neck bilaterally or carotid stenosis in the neck bilaterally. Systolic velocity peaks in the carotid system were quite variable due to irregular heart rate. Electronically Signed   By: Aletta Edouard M.D.   On: 10/19/2017 12:05   Mr Jodene Nam Head/brain XL Cm  Result Date: 10/18/2017 CLINICAL DATA:  47 y/o F; acute stroke in right cerebellum for follow-up. EXAM: MRA HEAD WITHOUT CONTRAST TECHNIQUE: Angiographic images of the Circle of Willis were obtained using MRA technique without intravenous contrast. COMPARISON:  10/18/2016 MRI of the head. FINDINGS: Internal carotid arteries:  Patent. Anterior cerebral arteries:  Patent. Middle cerebral arteries: Patent. Anterior communicating  artery: Patent. Posterior communicating arteries: Patent right. No left identified, likely hypoplastic or absent. Posterior cerebral arteries:  Patent. Basilar artery:  Patent. Vertebral arteries: Patent. Patent bilateral ACA, AICA, and right PICA, left PICA origin not included within field of view or hypoplastic. No evidence of high-grade stenosis, large vessel occlusion, or aneurysm. IMPRESSION: Normal MRA of the head. Electronically Signed   By: Kristine Garbe M.D.   On: 10/18/2017 22:27    EKG:   Orders placed or performed during the hospital encounter of 10/18/17  . ED EKG  . ED EKG      Management plans discussed with the patient, family and they are in agreement.  CODE STATUS:     Code Status Orders  (From admission, onward)        Start     Ordered   10/18/17 2001  Full code  Continuous     10/18/17 2001    Code Status History    Date Active Date Inactive Code Status Order ID Comments User Context   10/15/2017 16:01 10/15/2017 22:14 Full Code [id number]  Yolonda Kida, MD Inpatient      TOTAL TIME TAKING CARE OF THIS PATIENT: 45  minutes.    Note: This dictation was prepared with Dragon dictation along with smaller phrase technology. Any transcriptional errors that result from this process are unintentional.   @[facility] @  on 10/19/2017 at 2:20 PM  Between 7am to 6pm - Pager - [phone]  After 6pm go to [email or web address] - password [email or web address] [facility]  Office  [phone]  CC: Primary care physician; Marinda Elk, MD

## 2017-10-19 NOTE — Plan of Care (Signed)
Plan for d/c this shift.

## 2017-10-19 NOTE — Progress Notes (Signed)
OT Cancellation Note  Patient Details Name: Michelle Norris MRN: [id number] DOB: October 20, 1970   Cancelled Treatment:    Reason Eval/Treat Not Completed: Patient at procedure or test/ unavailable. Order received, chart reviewed. Pt out of room for testing upon initial attempt. Will re-attempt OT evaluation at later date/time as pt is available and medically appropriate.  Jeni Salles, MPH, MS, OTR/L ascom [phone] 10/19/17, 10:33 AM

## 2017-10-19 NOTE — Evaluation (Addendum)
Occupational Therapy Evaluation Patient Details Name: Michelle Norris MRN: [id number] DOB: 12/22/1970 Today's Date: 10/19/2017    History of Present Illness Pt admitted for CVA.  PMH includes afib, Htn, cardiomyopathy, sleep apnea, dizziness/SOB.   Clinical Impression   Pt seen for OT evaluation this date. Pt independent at baseline, recently limited with tolerance for activity due to recent cardiac issues. No deficits noted with vision/cognition/coordination/strength/sensation. Pt does present with impaired activity tolerance and becomes SOB easily requiring rest breaks. This limits pt ability to participate in functional mobility and ADL/IADL tasks requiring additional time, rest breaks, and occasionally assist from family to complete. Pt noted poor quality of sleep overnight, multifactorial. Pt/spouse educated in modifications/strategies to improve sleep hygiene to support optimal energy recovery overnight. Pt/spouse also educated in energy conservation strategies (please see details below) to support participation in meaningful occupations while minimizing risk of over exertion and SOB. Pt/spouse verbalized understanding of all education/training provided. Recommend pt follow up with outpatient cardiac rehabilitation to support maximal return to PLOF.  No additional acute care needs at this time. Will sign off.     Follow Up Recommendations  Other (comment)(outpatient cardiac rehab)    Equipment Recommendations  None recommended by OT    Recommendations for Other Services       Precautions / Restrictions Precautions Precautions: Fall      Mobility Bed Mobility Overal bed mobility: Independent                Transfers Overall transfer level: Independent                    Balance Overall balance assessment: Independent                                         ADL either performed or assessed with clinical judgement   ADL Overall ADL's :  Independent                                       General ADL Comments: at baseline for ADL tasks, however gets winded easily requiring rest breaks. Good self awareness of activity limits. Educated pt/spouse in energy conservation strategies including activity pacing, home/routines mods, pursed lip breathing, work simplification and body positioning to minimize risk of over exertion and SOB.      Vision Baseline Vision/History: No visual deficits Patient Visual Report: No change from baseline(pt noted mild blurry vision on Thursday evening, but has resolved) Vision Assessment?: No apparent visual deficits     Perception     Praxis      Pertinent Vitals/Pain Pain Assessment: No/denies pain     Hand Dominance     Extremity/Trunk Assessment Upper Extremity Assessment Upper Extremity Assessment: Overall WFL for tasks assessed   Lower Extremity Assessment Lower Extremity Assessment: Overall WFL for tasks assessed   Cervical / Trunk Assessment Cervical / Trunk Assessment: Normal   Communication Communication Communication: No difficulties   Cognition Arousal/Alertness: Awake/alert Behavior During Therapy: WFL for tasks assessed/performed Overall Cognitive Status: Within Functional Limits for tasks assessed                                     General Comments       Exercises  Other Exercises Other Exercises: pt/spouse educated in strategies/modifications to sleep hygiene to improve quality of sleep for optimal rest to support activity tolerance for the next day   Shoulder Instructions      Home Living Family/patient expects to be discharged to:: Private residence Living Arrangements: Spouse/significant other Available Help at Discharge: Family Type of Home: House Home Access: Stairs to enter Technical brewer of Steps: 3 Entrance Stairs-Rails: Can reach both Home Layout: One level     Bathroom Shower/Tub: Animal nutritionist: Standard Bathroom Accessibility: Yes   Home Equipment: None          Prior Functioning/Environment Level of Independence: Independent        Comments: independent, recently activity tolerance has been low and pt has been somewhat limited with activity due to recent heart issues        OT Problem List: Decreased activity tolerance;Cardiopulmonary status limiting activity      OT Treatment/Interventions:      OT Goals(Current goals can be found in the care plan section) Acute Rehab OT Goals Patient Stated Goal: To return home and continue to follow healthy diet and stay fit. OT Goal Formulation: All assessment and education complete, DC therapy  OT Frequency:     Barriers to D/C:            Co-evaluation              AM-PAC PT "6 Clicks" Daily Activity     Outcome Measure Help from another person eating meals?: None Help from another person taking care of personal grooming?: None Help from another person toileting, which includes using toliet, bedpan, or urinal?: None Help from another person bathing (including washing, rinsing, drying)?: None Help from another person to put on and taking off regular upper body clothing?: None Help from another person to put on and taking off regular lower body clothing?: None 6 Click Score: 24   End of Session Nurse Communication: Other (comment)(evaluation completed)  Activity Tolerance: Patient tolerated treatment well Patient left: in bed;with call bell/phone within reach;with family/visitor present  OT Visit Diagnosis: Other abnormalities of gait and mobility (R26.89)                Time: 1344-1410 OT Time Calculation (min): 26 min Charges:  OT General Charges $OT Visit: 1 Visit OT Evaluation $OT Eval Low Complexity: 1 Low OT Treatments $Self Care/Home Management : 23-37 mins  Jeni Salles, MPH, MS, OTR/L ascom [phone] 10/19/17, 2:25 PM

## 2017-10-19 NOTE — Plan of Care (Signed)
Michelle Norris denies s/s of weakness, visual changes or neurological changes. Doing well throughout shift. Free of falls. Husband at bedside. Swallowing liquids, pills and food without concerns.

## 2017-10-19 NOTE — Plan of Care (Signed)
  Progressing Education: Knowledge of disease or condition will improve 10/19/2017 0205 - Progressing by Annitta Needs, RN Knowledge of secondary prevention will improve 10/19/2017 0205 - Progressing by Annitta Needs, RN Knowledge of patient specific risk factors addressed and post discharge goals established will improve 10/19/2017 0205 - Progressing by Annitta Needs, RN Coping: Will verbalize positive feelings about self 10/19/2017 0205 - Progressing by Annitta Needs, RN Health Behavior/Discharge Planning: Ability to manage health-related needs will improve 10/19/2017 0205 - Progressing by Annitta Needs, RN Self-Care: Ability to participate in self-care as condition permits will improve 10/19/2017 0205 - Progressing by Annitta Needs, RN Nutrition: Risk of aspiration will decrease 10/19/2017 0205 - Progressing by Annitta Needs, RN Ischemic Stroke/TIA Tissue Perfusion: Complications of ischemic stroke/TIA will be minimized 10/19/2017 0205 - Progressing by Annitta Needs, RN Health Behavior/Discharge Planning: Ability to manage health-related needs will improve 10/19/2017 0205 - Progressing by Annitta Needs, RN Clinical Measurements: Ability to maintain clinical measurements within normal limits will improve 10/19/2017 0205 - Progressing by Annitta Needs, RN Activity: Risk for activity intolerance will decrease 10/19/2017 0205 - Progressing by Annitta Needs, RN Nutrition: Adequate nutrition will be maintained 10/19/2017 0205 - Progressing by Annitta Needs, RN Pain Managment: General experience of comfort will improve 10/19/2017 0205 - Progressing by Annitta Needs, RN Safety: Ability to remain free from injury will improve 10/19/2017 0205 - Progressing by Annitta Needs, RN

## 2017-10-19 NOTE — Progress Notes (Signed)
MRI of the brain results discussed with the patient's son ERNIE

## 2017-10-19 NOTE — Progress Notes (Signed)
SLP Cancellation Note  Patient Details Name: Michelle Norris MRN: [id number] DOB: March 23, 1971   Cancelled treatment:       Reason Eval/Treat Not Completed: SLP screened, no needs identified, will sign off(chart reviewed; consulted NSG and met w/ pt/husband). Pt denied any difficulty swallowing and is currently on a regular diet; tolerates swallowing pills w/ water per NSG. Pt conversed at conversational level w/out deficits noted; pt and family denied any speech-language deficits.  No further skilled ST services indicated as pt appears at her baseline. Pt agreed. NSG to reconsult if any change in status.     Orinda Kenner, MS, CCC-SLP Watson,Katherine 10/19/2017, 11:39 AM

## 2017-10-19 NOTE — Evaluation (Signed)
Physical Therapy Evaluation Patient Details Name: Michelle Norris MRN: [id number] DOB: [birth year] Today's Date: 10/19/2017   History of Present Illness  Pt admitted for CVA.  PMH includes afib, Htn, cardiomyopathy, sleep apnea, dizziness/SOB.  Clinical Impression  Pt is a 47 year old female who lives in a one story home with her husband.  Pt is able to ambulate 200 ft without and AD as well as perform bed mobility and transfers independently.  PT monitored HR during treatment and pt remsined within a normal range. Pt desat. To 90% following 200 ft. Of ambulation and recovered immediately after sitting on EOB. Pt is not a candidate for PT at this time but would benefit from the [facility] program at [facility]  PT educated pt concerning value of cardiac rehab and also discussed safe continuation of wt loss with controlled diet and low to moderate intensity monitored exercise.    Follow Up Recommendations  Cardiac Rehab.    Equipment Recommendations       Recommendations for Other Services       Precautions / Restrictions Precautions Precautions: Fall      Mobility  Bed Mobility Overal bed mobility: Independent                Transfers Overall transfer level: Independent                  Ambulation/Gait Ambulation/Gait assistance: Independent Ambulation Distance (Feet): 200 Feet Assistive device: None Gait Pattern/deviations: WFL(Within Functional Limits)   Gait velocity interpretation: at or above normal speed for age/gender General Gait Details: Pt presented with slight lateral displacement to initiate swing phase during gait, good foot clearance and hip/knee flexion.  Pt able to negotiate 3 steps using step to gait pattern and unilateral handrail in reasonable amount of time.  This is pt's baseline function.  Stairs            Wheelchair Mobility    Modified Rankin (Stroke Patients Only)       Balance Overall balance assessment: Independent                                            Pertinent Vitals/Pain Pain Assessment: No/denies pain    Home Living Family/patient expects to be discharged to:: Private residence Living Arrangements: Spouse/significant other Available Help at Discharge: Family Type of Home: House Home Access: Stairs to enter Entrance Stairs-Rails: Can reach both Entrance Stairs-Number of Steps: 3 Home Layout: One level Home Equipment: None      Prior Function Level of Independence: Independent               Hand Dominance        Extremity/Trunk Assessment   Upper Extremity Assessment Upper Extremity Assessment: Overall WFL for tasks assessed    Lower Extremity Assessment Lower Extremity Assessment: Overall WFL for tasks assessed    Cervical / Trunk Assessment Cervical / Trunk Assessment: Normal  Communication   Communication: No difficulties  Cognition Arousal/Alertness: Awake/alert Behavior During Therapy: WFL for tasks assessed/performed Overall Cognitive Status: Within Functional Limits for tasks assessed                                        General Comments      Exercises     Assessment/Plan  PT Assessment Patent does not need any further PT services  PT Problem List         PT Treatment Interventions      PT Goals (Current goals can be found in the Care Plan section)  Acute Rehab PT Goals Patient Stated Goal: To return home and continue to follow healthy diet and stay fit. PT Goal Formulation: With patient/family Time For Goal Achievement: 11/02/17 Potential to Achieve Goals: Good    Frequency     Barriers to discharge        Co-evaluation               AM-PAC PT "6 Clicks" Daily Activity  Outcome Measure Difficulty turning over in bed (including adjusting bedclothes, sheets and blankets)?: None Difficulty moving from lying on back to sitting on the side of the bed? : None Difficulty sitting down on and standing up  from a chair with arms (e.g., wheelchair, bedside commode, etc,.)?: A Little Help needed moving to and from a bed to chair (including a wheelchair)?: None Help needed walking in hospital room?: None Help needed climbing 3-5 steps with a railing? : A Little 6 Click Score: 22    End of Session Equipment Utilized During Treatment: Gait belt Activity Tolerance: Patient tolerated treatment well Patient left: in bed;with call bell/phone within reach Nurse Communication: Mobility status PT Visit Diagnosis: Dizziness and giddiness (R42)    Time: 1310-1335 PT Time Calculation (min) (ACUTE ONLY): 25 min   Charges:   PT Evaluation $PT Eval Low Complexity: 1 Low     PT G Codes:   PT G-Codes **NOT FOR INPATIENT CLASS** Functional Assessment Tool Used: AM-PAC 6 Clicks Basic Mobility   Roxanne Gates, PT, DPT   Roxanne Gates 10/19/2017, 1:57 PM

## 2017-10-19 NOTE — Care Management Note (Signed)
Case Management Note  Patient Details  Name: Michelle Norris MRN: [id number] Date of Birth: [birth year]  Subjective/Objective:             Eliquis coupon provided.        Action/Plan:   Expected Discharge Date:  10/19/17               Expected Discharge Plan:     In-House Referral:     Discharge planning Services     Post Acute Care Choice:    Choice offered to:     DME Arranged:    DME Agency:     HH Arranged:    HH Agency:     Status of Service:     If discussed at [employer], dates discussed:    Additional Comments:  [name] A, RN 10/19/2017, 11:23 AM

## 2017-10-20 LAB — HIV ANTIBODY (ROUTINE TESTING W REFLEX): HIV Screen 4th Generation wRfx: NONREACTIVE

## 2017-11-06 ENCOUNTER — Other Ambulatory Visit: Payer: Self-pay | Admitting: Physician Assistant

## 2017-11-06 DIAGNOSIS — R928 Other abnormal and inconclusive findings on diagnostic imaging of breast: Secondary | ICD-10-CM

## 2017-11-19 ENCOUNTER — Encounter (INDEPENDENT_AMBULATORY_CARE_PROVIDER_SITE_OTHER): Payer: Self-pay | Admitting: Vascular Surgery

## 2017-12-02 ENCOUNTER — Encounter
Admission: RE | Disposition: A | Discharge status: Home / Self Care | Payer: Self-pay | Source: Ambulatory Visit | Attending: Internal Medicine

## 2017-12-02 ENCOUNTER — Encounter: Payer: Self-pay | Admitting: *Deleted

## 2017-12-02 ENCOUNTER — Ambulatory Visit: Payer: Commercial Managed Care - PPO | Admitting: Anesthesiology

## 2017-12-02 ENCOUNTER — Ambulatory Visit
Admission: RE | Admit: 2017-12-02 | Discharge: 2017-12-02 | Disposition: A | Discharge status: Home / Self Care | Payer: Commercial Managed Care - PPO | Source: Ambulatory Visit | Attending: Internal Medicine | Admitting: Internal Medicine

## 2017-12-02 ENCOUNTER — Encounter (INDEPENDENT_AMBULATORY_CARE_PROVIDER_SITE_OTHER): Payer: Self-pay | Admitting: Vascular Surgery

## 2017-12-02 DIAGNOSIS — Z823 Family history of stroke: Secondary | ICD-10-CM | POA: Diagnosis not present

## 2017-12-02 DIAGNOSIS — Z6835 Body mass index (BMI) 35.0-35.9, adult: Secondary | ICD-10-CM | POA: Insufficient documentation

## 2017-12-02 DIAGNOSIS — Z8673 Personal history of transient ischemic attack (TIA), and cerebral infarction without residual deficits: Secondary | ICD-10-CM | POA: Insufficient documentation

## 2017-12-02 DIAGNOSIS — Z7901 Long term (current) use of anticoagulants: Secondary | ICD-10-CM | POA: Insufficient documentation

## 2017-12-02 DIAGNOSIS — I110 Hypertensive heart disease with heart failure: Secondary | ICD-10-CM | POA: Diagnosis not present

## 2017-12-02 DIAGNOSIS — I482 Chronic atrial fibrillation: Secondary | ICD-10-CM | POA: Diagnosis not present

## 2017-12-02 DIAGNOSIS — Z7982 Long term (current) use of aspirin: Secondary | ICD-10-CM | POA: Diagnosis not present

## 2017-12-02 DIAGNOSIS — E785 Hyperlipidemia, unspecified: Secondary | ICD-10-CM | POA: Insufficient documentation

## 2017-12-02 DIAGNOSIS — I208 Other forms of angina pectoris: Secondary | ICD-10-CM | POA: Diagnosis not present

## 2017-12-02 DIAGNOSIS — Z833 Family history of diabetes mellitus: Secondary | ICD-10-CM | POA: Insufficient documentation

## 2017-12-02 DIAGNOSIS — E669 Obesity, unspecified: Secondary | ICD-10-CM | POA: Diagnosis not present

## 2017-12-02 DIAGNOSIS — Z8249 Family history of ischemic heart disease and other diseases of the circulatory system: Secondary | ICD-10-CM | POA: Diagnosis not present

## 2017-12-02 DIAGNOSIS — G4733 Obstructive sleep apnea (adult) (pediatric): Secondary | ICD-10-CM | POA: Insufficient documentation

## 2017-12-02 DIAGNOSIS — Z79899 Other long term (current) drug therapy: Secondary | ICD-10-CM | POA: Diagnosis not present

## 2017-12-02 HISTORY — PX: CARDIOVERSION: EP1203

## 2017-12-02 SURGERY — CARDIOVERSION (CATH LAB)
Anesthesia: General

## 2017-12-02 MED ORDER — PROPOFOL 10 MG/ML IV BOLUS
INTRAVENOUS | Status: AC
  Filled 2017-12-02: qty 20

## 2017-12-02 MED ORDER — SODIUM CHLORIDE 0.9 % IV SOLN
INTRAVENOUS | Status: DC
  Administered 2017-12-02: 1000 mL via INTRAVENOUS

## 2017-12-02 MED ORDER — PROPOFOL 10 MG/ML IV BOLUS
INTRAVENOUS | Status: DC | PRN
  Administered 2017-12-02: 70 mg via INTRAVENOUS
  Administered 2017-12-02: 30 mg via INTRAVENOUS

## 2017-12-02 NOTE — Anesthesia Postprocedure Evaluation (Signed)
Anesthesia Post Note  Patient: Michelle Norris  Procedure(s) Performed: CARDIOVERSION (N/A )  Patient location during evaluation: Cath Lab Anesthesia Type: General Level of consciousness: awake and alert Pain management: pain level controlled Vital Signs Assessment: post-procedure vital signs reviewed and stable Respiratory status: spontaneous breathing, nonlabored ventilation, respiratory function stable and patient connected to nasal cannula oxygen Cardiovascular status: blood pressure returned to baseline and stable Postop Assessment: no apparent nausea or vomiting Anesthetic complications: no     Last Vitals:  Vitals:   12/02/17 0815 12/02/17 0830  BP: 113/72 116/71  Pulse: 66 64  Resp: 18 18  Temp:    SpO2: 97% 98%    Last Pain: There were no vitals filed for this visit.               Precious Haws Piscitello

## 2017-12-02 NOTE — Transfer of Care (Signed)
Immediate Anesthesia Transfer of Care Note  Patient: Michelle Norris  Procedure(s) Performed: CARDIOVERSION (N/A )  Patient Location: PACU  Anesthesia Type:General  Level of Consciousness: sedated and responds to stimulation  Airway & Oxygen Therapy: Patient Spontanous Breathing and Patient connected to nasal cannula oxygen  Post-op Assessment: Report given to RN and Post -op Vital signs reviewed and stable  Post vital signs: Reviewed and stable  Last Vitals:  Vitals:   12/02/17 0749 12/02/17 0750  BP:  106/76  Pulse: 64 66  Resp: (!) 21 (!) 27  Temp:    SpO2: 97% 97%    Last Pain: There were no vitals filed for this visit.       Complications: No apparent anesthesia complications
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
## 2017-12-02 NOTE — Progress Notes (Signed)
Patient post cardioversion per Dr Clayborn Bigness, vitals stable, awake and taking po's without difficulty. Husband at bedside. Sinus rhythm per monitor. Discharge instructions given with questions answered.

## 2017-12-02 NOTE — CV Procedure (Signed)
Outpatient cardioversion for atrial fibrillation Patient is been on Eliquis metoprolol Patient was brought to the specials recovery Anesthesia handled sedation Patient was cardioverted with 150 J biphasic joules with successful cardioversion to sinus rhythm from A. Fib Patient originally had unsuccessful attempt at 120 Anesthesia handled recovery of the patient Vital signs remained stable blood pressure was 120/80 pulse initially was 95 in A. Fib Post cardioversion blood pressure was 112/70 heart rate was 68 sinus rhythm . Conclusion Successful cardioversion from A. Fib Patient maintain Eliquis and metoprolol Follow-up in the office 1-2 weeks

## 2017-12-02 NOTE — Anesthesia Post-op Follow-up Note (Signed)
Anesthesia QCDR form completed.        

## 2017-12-02 NOTE — Discharge Instructions (Signed)
Electrical Cardioversion, Care After °This sheet gives you information about how to care for yourself after your procedure. Your health care provider may also give you more specific instructions. If you have problems or questions, contact your health care provider. °What can I expect after the procedure? °After the procedure, it is common to have: °· Some redness on the skin where the shocks were given. ° °Follow these instructions at home: °· Do not drive for 24 hours if you were given a medicine to help you relax (sedative). °· Take over-the-counter and prescription medicines only as told by your health care provider. °· Ask your health care provider how to check your pulse. Check it often. °· Rest for 48 hours after the procedure or as told by your health care provider. °· Avoid or limit your caffeine use as told by your health care provider. °Contact a health care provider if: °· You feel like your heart is beating too quickly or your pulse is not regular. °· You have a serious muscle cramp that does not go away. °Get help right away if: °· You have discomfort in your chest. °· You are dizzy or you feel faint. °· You have trouble breathing or you are short of breath. °· Your speech is slurred. °· You have trouble moving an arm or leg on one side of your body. °· Your fingers or toes turn cold or blue. °This information is not intended to replace advice given to you by your health care provider. Make sure you discuss any questions you have with your health care provider. °Document Released: 07/15/2013 Document Revised: 04/27/2016 Document Reviewed: 03/30/2016 °Elsevier Interactive Patient Education © 2018 [employer] Inc. ° °

## 2017-12-03 ENCOUNTER — Ambulatory Visit (INDEPENDENT_AMBULATORY_CARE_PROVIDER_SITE_OTHER): Payer: Commercial Managed Care - PPO | Admitting: Vascular Surgery

## 2017-12-03 ENCOUNTER — Encounter (INDEPENDENT_AMBULATORY_CARE_PROVIDER_SITE_OTHER): Payer: Self-pay | Admitting: Vascular Surgery

## 2017-12-03 VITALS — BP 104/70 | HR 63 | Resp 16 | Ht 68.0 in | Wt 270.0 lb

## 2017-12-03 DIAGNOSIS — R6889 Other general symptoms and signs: Secondary | ICD-10-CM | POA: Diagnosis not present

## 2017-12-03 DIAGNOSIS — E785 Hyperlipidemia, unspecified: Secondary | ICD-10-CM | POA: Diagnosis not present

## 2017-12-03 DIAGNOSIS — M79602 Pain in left arm: Secondary | ICD-10-CM | POA: Diagnosis not present

## 2017-12-03 DIAGNOSIS — I4891 Unspecified atrial fibrillation: Secondary | ICD-10-CM | POA: Insufficient documentation

## 2017-12-03 DIAGNOSIS — I482 Chronic atrial fibrillation, unspecified: Secondary | ICD-10-CM | POA: Insufficient documentation
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
## 2017-12-04 ENCOUNTER — Ambulatory Visit (INDEPENDENT_AMBULATORY_CARE_PROVIDER_SITE_OTHER): Payer: Commercial Managed Care - PPO

## 2017-12-04 ENCOUNTER — Encounter (INDEPENDENT_AMBULATORY_CARE_PROVIDER_SITE_OTHER): Payer: Self-pay | Admitting: Vascular Surgery

## 2017-12-04 ENCOUNTER — Ambulatory Visit (INDEPENDENT_AMBULATORY_CARE_PROVIDER_SITE_OTHER): Payer: Commercial Managed Care - PPO | Admitting: Vascular Surgery

## 2017-12-04 VITALS — BP 117/77 | HR 54 | Resp 17 | Wt 270.0 lb

## 2017-12-04 DIAGNOSIS — E785 Hyperlipidemia, unspecified: Secondary | ICD-10-CM

## 2017-12-04 DIAGNOSIS — R6889 Other general symptoms and signs: Secondary | ICD-10-CM

## 2017-12-04 DIAGNOSIS — M79602 Pain in left arm: Secondary | ICD-10-CM
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
## 2018-01-08 ENCOUNTER — Ambulatory Visit
Admission: RE | Admit: 2018-01-08 | Discharge: 2018-01-08 | Disposition: A | Discharge status: Home / Self Care | Payer: Commercial Managed Care - PPO | Source: Ambulatory Visit | Attending: Physician Assistant | Admitting: Physician Assistant

## 2018-01-08 DIAGNOSIS — R928 Other abnormal and inconclusive findings on diagnostic imaging of breast: Secondary | ICD-10-CM

## 2018-01-08 DIAGNOSIS — R921 Mammographic calcification found on diagnostic imaging of breast: Secondary | ICD-10-CM | POA: Diagnosis not present

## 2018-01-22 ENCOUNTER — Other Ambulatory Visit: Payer: Self-pay | Admitting: Orthopedic Surgery

## 2018-01-22 DIAGNOSIS — M25512 Pain in left shoulder: Secondary | ICD-10-CM

## 2018-01-27 ENCOUNTER — Ambulatory Visit (HOSPITAL_COMMUNITY): Payer: Commercial Managed Care - PPO

## 2018-01-27 ENCOUNTER — Other Ambulatory Visit: Payer: Self-pay | Admitting: Orthopedic Surgery

## 2018-01-27 DIAGNOSIS — M25512 Pain in left shoulder: Secondary | ICD-10-CM

## 2018-01-31 ENCOUNTER — Ambulatory Visit
Admission: RE | Admit: 2018-01-31 | Discharge: 2018-01-31 | Disposition: A | Discharge status: Home / Self Care | Payer: Commercial Managed Care - PPO | Source: Ambulatory Visit | Attending: Orthopedic Surgery | Admitting: Orthopedic Surgery

## 2018-01-31 DIAGNOSIS — M25512 Pain in left shoulder: Secondary | ICD-10-CM | POA: Diagnosis not present

## 2018-01-31 DIAGNOSIS — M75112 Incomplete rotator cuff tear or rupture of left shoulder, not specified as traumatic: Secondary | ICD-10-CM | POA: Diagnosis not present

## 2018-01-31 DIAGNOSIS — M129 Arthropathy, unspecified: Secondary | ICD-10-CM | POA: Diagnosis not present

## 2018-02-04 ENCOUNTER — Other Ambulatory Visit: Payer: Self-pay | Admitting: Sports Medicine

## 2018-02-04 DIAGNOSIS — S86819A Strain of other muscle(s) and tendon(s) at lower leg level, unspecified leg, initial encounter: Secondary | ICD-10-CM

## 2018-02-04 DIAGNOSIS — M25562 Pain in left knee: Principal | ICD-10-CM

## 2018-02-04 DIAGNOSIS — G8929 Other chronic pain: Secondary | ICD-10-CM

## 2018-02-11 ENCOUNTER — Ambulatory Visit
Admission: RE | Admit: 2018-02-11 | Discharge: 2018-02-11 | Disposition: A | Discharge status: Home / Self Care | Payer: Commercial Managed Care - PPO | Source: Ambulatory Visit | Attending: Sports Medicine | Admitting: Sports Medicine

## 2018-02-11 DIAGNOSIS — M1712 Unilateral primary osteoarthritis, left knee: Secondary | ICD-10-CM | POA: Insufficient documentation

## 2018-02-11 DIAGNOSIS — S86819A Strain of other muscle(s) and tendon(s) at lower leg level, unspecified leg, initial encounter: Secondary | ICD-10-CM

## 2018-02-11 DIAGNOSIS — M7122 Synovial cyst of popliteal space [Baker], left knee: Secondary | ICD-10-CM | POA: Insufficient documentation

## 2018-02-11 DIAGNOSIS — M25562 Pain in left knee: Secondary | ICD-10-CM | POA: Diagnosis present

## 2018-02-11 DIAGNOSIS — M67462 Ganglion, left knee: Secondary | ICD-10-CM | POA: Insufficient documentation

## 2018-02-11 DIAGNOSIS — G8929 Other chronic pain: Secondary | ICD-10-CM

## 2018-02-20 ENCOUNTER — Encounter: Payer: Commercial Managed Care - PPO | Attending: Physician Assistant | Admitting: Dietician

## 2018-02-20 VITALS — Ht 68.0 in | Wt 275.0 lb

## 2018-02-20 DIAGNOSIS — M25562 Pain in left knee: Secondary | ICD-10-CM | POA: Diagnosis present

## 2018-02-20 DIAGNOSIS — S86819A Strain of other muscle(s) and tendon(s) at lower leg level, unspecified leg, initial encounter: Secondary | ICD-10-CM | POA: Insufficient documentation

## 2018-02-20 DIAGNOSIS — G8929 Other chronic pain: Secondary | ICD-10-CM | POA: Insufficient documentation

## 2018-02-20 DIAGNOSIS — Z6841 Body Mass Index (BMI) 40.0 and over, adult: Secondary | ICD-10-CM

## 2018-02-20 NOTE — Patient Instructions (Signed)
   Resume tracking food intake for feedback on calories, carbs, protein and fat.   Measure food portions for accuracy, until comfortable with estimating. Give yourself a checkup occasionally to make sure portions don't gradually increase.  Continue with healthy food choices and regular exercise, great job!

## 2018-02-20 NOTE — Progress Notes (Signed)
Medical Nutrition Therapy: Visit start time: 8144  end time: 1150  Assessment:  Diagnosis: obesity Past medical history: HTN, sleep apnea Psychosocial issues/ stress concerns: none Preferred learning method:  . Auditory . Visual . Hands-on  Current weight: 275.0lbs Height: 5'8" Medications, supplements: reconciled list in medical record  Progress and evaluation: Patient reports working on weight loss last year by following low-carb diet and measuring food portions. She lost over 50lbs, but then began to have issues with what turned out to be a-fib, then a subsequent stroke. Her health has now stabilized, and she is resuming physical activity but weight has increased aby about 20lbs since 08/2017, and she is having difficulty losing any weight. She does report several meals out last weekend during her daughter's graduation from college out of state.    Physical activity: gym workout for 1 hour + gardening/ mowing for 2-4 hours, a total of 4-5 days per week  Dietary Intake:  Usual eating pattern includes 2-3 meals and 1-2 snacks per day. Dining out frequency: 2 meals per week.  Breakfast: special K cereal; 2 eggs occasionally with low carb wheat wrap; oatmeal with blueberries or apple or peach. Drinks orange juice or diet cranberry juice with meds Snack: fruit Lunch: often none; sometimes cottage cheese with berries, apple with peanut butter; wrap with peanut butter and banana, or leftovers Snack: trail mix with nuts, cranberries, dates Supper: spaghetti squash with shrimp or chicken, steak or chicken or pork loin with zucchini or asparagus, broccoli, salad; occasionally peas or corn, rarely white rice Snack: usually none; very rarely ice cream (reduced sugar) Beverages: water, small amount juice.   Nutrition Care Education: Topics covered: weight control Basic nutrition: basic food groups, appropriate nutrient balance    Weight control: benefits of weight control, determining reasonable  weight loss rate of not more than 2lbs per week; calculated energy needs at 1500 for weight loss, and discussed healthy nutrient balance of at least 40% CHO, 30% protein and 30% fat. Discussed benefits of tracking food intake; measuring portions. Discussed weight loss plateaus and possible causes such as limited activity for multiple weeks, changes in medications, recent travel and restaurant eating which could affect fluid weight as well as actual body weight.   Nutritional Diagnosis:  [place]-3.3 Overweight/obesity As related to history of excess calories.  As evidenced by patient with BMI of 41.8.  Intervention: Instruction as noted above.   Patient is making healthy food choices, and is limiting added sugar and fat.    She is engaging in physical activity on a regular basis.    Set goals with input from patient.   Education Materials given:  . Plate Planner with food lists . Goals/ instructions   Learner/ who was taught:  . Patient   Level of understanding: Marland Kitchen Verbalizes/ demonstrates competency  Demonstrated degree of understanding via:   Teach back Learning barriers: . None  Willingness to learn/ readiness for change: . Eager, change in progress  Monitoring and Evaluation:  Dietary intake, exercise, and body weight      follow up: 03/27/18

## 2018-03-27 ENCOUNTER — Encounter: Payer: Commercial Managed Care - PPO | Attending: Physician Assistant | Admitting: Dietician

## 2018-03-27 ENCOUNTER — Encounter: Payer: Self-pay | Admitting: Dietician

## 2018-03-27 VITALS — Ht 68.0 in | Wt 279.2 lb

## 2018-03-27 DIAGNOSIS — M25562 Pain in left knee: Secondary | ICD-10-CM | POA: Diagnosis not present

## 2018-03-27 DIAGNOSIS — S86819A Strain of other muscle(s) and tendon(s) at lower leg level, unspecified leg, initial encounter: Secondary | ICD-10-CM | POA: Diagnosis not present

## 2018-03-27 DIAGNOSIS — G8929 Other chronic pain: Secondary | ICD-10-CM | POA: Diagnosis not present

## 2018-03-27 DIAGNOSIS — Z6841 Body Mass Index (BMI) 40.0 and over, adult: Secondary | ICD-10-CM

## 2018-03-27 NOTE — Progress Notes (Signed)
Medical Nutrition Therapy: Visit start time: 1100  end time: 1200  Assessment:  Diagnosis: obesity Medical history changes: no changes per patient Psychosocial issues/ stress concerns: patient reports high stress level   Current weight: 279.2lbs Height: 5'8" Medications, supplement changes: reconciled list in medical record  Progress and evaluation: Weight gain of about 4lbs since previous visit on 02/20/18. Patient reports travelling to [place] twice to help care for ailing stepfather, which has somewhat affected diet. She has been unable to begin gym visits yet.   Physical activity: yardwork several hours 2x a week  Dietary Intake:  Usual eating pattern includes 2-3 meals and 1-2 snacks per day. Dining out frequency: 2 meals per week, more when travelling. Makes healthy choices.  Breakfast: cereal, eggs with low-carb wrap, oatmeal with fruit. Drinks small glass orange juice. Snack: fruit Lunch: sometimes none; often fruit with cottage cheese, or apple with peanut butter, peanut butter and banana wrap Snack: none or sometimes trail mix, has decreased portion Supper: spaghetti squash with shrimp or chicken, other lean protein with vegetables and occasionally peas or corn.  Snack: none, sometimes low sugar ice cream Beverages: water, small amount juice  Nutrition Care Education: Topics covered: weight control  Weight control: factors affecting weight and ability to lose weight; discussed weight loss surgery, weight loss medications. Reviewed role of physical activity and likely need for significant amount to increase metabolism.    Nutritional Diagnosis:  [place]-3.3 Overweight/obesity As related to history of excess calories.  As evidenced by pateint with BMI of 42.  Intervention: discussion as noted above.   Patient plans to investigate weight loss surgery.    Will schedule additional MNT follow-up as needed.   Education Materials given:  Marland Kitchen Goals/ instructions   Learner/ who was  taught:  . Patient    Level of understanding: Marland Kitchen Verbalizes/ demonstrates competency   Demonstrated degree of understanding via:   Teach back Learning barriers: . None  Willingness to learn/ readiness for change: . Eager, change in progress  Monitoring and Evaluation:  Dietary intake, exercise, and body weight      follow up: prn

## 2018-03-27 NOTE — Patient Instructions (Signed)
   Consider attending an interest seminar for weight loss surgery: [facility] or [facility]  Resume gym visits  Continue with healthy food choices!

## 2018-06-08 DIAGNOSIS — A770 Spotted fever due to Rickettsia rickettsii: Secondary | ICD-10-CM

## 2018-06-08 HISTORY — DX: Spotted fever due to Rickettsia rickettsii: A77.0

## 2018-06-13 ENCOUNTER — Telehealth: Payer: Self-pay | Admitting: Dietician

## 2018-06-13 NOTE — Telephone Encounter (Signed)
Patient called to request another follow-up visit; scheduled her for 06/17/18.

## 2018-06-17 ENCOUNTER — Ambulatory Visit: Payer: Commercial Managed Care - PPO | Admitting: Dietician

## 2018-06-24 ENCOUNTER — Ambulatory Visit: Payer: Commercial Managed Care - PPO | Admitting: Dietician

## 2018-07-08 DIAGNOSIS — D72829 Elevated white blood cell count, unspecified: Secondary | ICD-10-CM

## 2018-07-08 HISTORY — DX: Elevated white blood cell count, unspecified: D72.829

## 2018-07-10 ENCOUNTER — Encounter: Payer: Self-pay | Admitting: Dietician

## 2018-07-10 NOTE — Progress Notes (Signed)
Patient cancelled RD appointment for 06/17/18, and missed the rescheduled appointment on 06/24/18. Have not heard back from her to reschedule again. Sent letter to referring provider.

## 2018-07-11 ENCOUNTER — Telehealth: Payer: Self-pay

## 2018-07-11 NOTE — Telephone Encounter (Signed)
Spoke with the patient to inform her of a new patient appointment time and date. The patient was agreeable and understanding for a new patient appointment time and date 10/8 @ 2:45 PM.

## 2018-07-15 ENCOUNTER — Other Ambulatory Visit: Payer: Self-pay

## 2018-07-15 ENCOUNTER — Encounter (INDEPENDENT_AMBULATORY_CARE_PROVIDER_SITE_OTHER): Payer: Self-pay

## 2018-07-15 ENCOUNTER — Encounter: Payer: Self-pay | Admitting: Oncology

## 2018-07-15 ENCOUNTER — Inpatient Hospital Stay: Payer: Commercial Managed Care - PPO

## 2018-07-15 ENCOUNTER — Inpatient Hospital Stay: Payer: Commercial Managed Care - PPO | Attending: Oncology | Admitting: Oncology

## 2018-07-15 VITALS — BP 130/78 | HR 76 | Temp 98.3°F | Resp 18 | Ht 68.0 in | Wt 293.7 lb

## 2018-07-15 DIAGNOSIS — Z8673 Personal history of transient ischemic attack (TIA), and cerebral infarction without residual deficits: Secondary | ICD-10-CM

## 2018-07-15 DIAGNOSIS — I4891 Unspecified atrial fibrillation: Secondary | ICD-10-CM | POA: Diagnosis not present

## 2018-07-15 DIAGNOSIS — R5381 Other malaise: Secondary | ICD-10-CM | POA: Insufficient documentation

## 2018-07-15 DIAGNOSIS — D72829 Elevated white blood cell count, unspecified: Secondary | ICD-10-CM | POA: Insufficient documentation

## 2018-07-15 DIAGNOSIS — R5383 Other fatigue: Secondary | ICD-10-CM | POA: Diagnosis not present

## 2018-07-15 DIAGNOSIS — Z79899 Other long term (current) drug therapy: Secondary | ICD-10-CM | POA: Insufficient documentation

## 2018-07-15 DIAGNOSIS — Z803 Family history of malignant neoplasm of breast: Secondary | ICD-10-CM | POA: Insufficient documentation

## 2018-07-15 DIAGNOSIS — I110 Hypertensive heart disease with heart failure: Secondary | ICD-10-CM | POA: Insufficient documentation

## 2018-07-15 DIAGNOSIS — Z7901 Long term (current) use of anticoagulants: Secondary | ICD-10-CM | POA: Insufficient documentation

## 2018-07-15 DIAGNOSIS — G4733 Obstructive sleep apnea (adult) (pediatric): Secondary | ICD-10-CM | POA: Diagnosis not present

## 2018-07-15 DIAGNOSIS — E785 Hyperlipidemia, unspecified: Secondary | ICD-10-CM | POA: Diagnosis not present

## 2018-07-15 DIAGNOSIS — I509 Heart failure, unspecified: Secondary | ICD-10-CM

## 2018-07-15 DIAGNOSIS — D729 Disorder of white blood cells, unspecified: Secondary | ICD-10-CM

## 2018-07-15 LAB — CBC WITH DIFFERENTIAL/PLATELET
BASOS ABS: 0.1 10*3/uL (ref 0–0.1)
BLASTS: 0 %
Band Neutrophils: 0 %
Basophils Relative: 1 %
EOS PCT: 2 %
Eosinophils Absolute: 0.2 10*3/uL (ref 0–0.7)
HCT: 44.8 % (ref 36.0–46.0)
HEMOGLOBIN: 14.3 g/dL (ref 12.0–15.0)
LYMPHS ABS: 3.2 10*3/uL (ref 1.0–3.6)
Lymphocytes Relative: 30 %
MCH: 29.4 pg (ref 26.0–34.0)
MCHC: 31.9 g/dL (ref 30.0–36.0)
MCV: 92.2 fL (ref 80.0–100.0)
METAMYELOCYTES PCT: 0 %
MONOS PCT: 7 %
MYELOCYTES: 0 %
Monocytes Absolute: 0.8 10*3/uL (ref 0.2–0.9)
NEUTROS ABS: 6.4 10*3/uL (ref 1.4–6.5)
NEUTROS PCT: 60 %
NRBC: 0 /100{WBCs}
Other: 0 %
PLATELETS: 297 10*3/uL (ref 150–400)
Promyelocytes Relative: 0 %
RBC: 4.86 MIL/uL (ref 3.87–5.11)
RDW: 13.5 % (ref 11.5–15.5)
WBC: 10.7 10*3/uL — AB (ref 4.0–10.5)
nRBC: 0 % (ref 0.0–0.2)

## 2018-07-15 LAB — TECHNOLOGIST SMEAR REVIEW

## 2018-07-15 NOTE — Progress Notes (Signed)
Patient here for initial visit. °
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
## 2018-08-22 ENCOUNTER — Encounter: Payer: Self-pay | Admitting: *Deleted

## 2018-08-24 MED ORDER — CEFAZOLIN SODIUM 10 G IJ SOLR
3.0000 g | Freq: Once | INTRAMUSCULAR | Status: DC
Start: 2018-08-24 — End: 2018-08-25
  Filled 2018-08-24: qty 3000

## 2018-08-25 ENCOUNTER — Ambulatory Visit: Payer: Commercial Managed Care - PPO | Admitting: Anesthesiology

## 2018-08-25 ENCOUNTER — Encounter
Admission: RE | Disposition: A | Discharge status: Home / Self Care | Payer: Self-pay | Source: Ambulatory Visit | Attending: Orthopedic Surgery

## 2018-08-25 ENCOUNTER — Ambulatory Visit
Admission: RE | Admit: 2018-08-25 | Discharge: 2018-08-25 | Disposition: A | Discharge status: Home / Self Care | Payer: Commercial Managed Care - PPO | Source: Ambulatory Visit | Attending: Orthopedic Surgery | Admitting: Orthopedic Surgery

## 2018-08-25 ENCOUNTER — Encounter: Payer: Self-pay | Admitting: Emergency Medicine

## 2018-08-25 ENCOUNTER — Other Ambulatory Visit: Payer: Self-pay

## 2018-08-25 DIAGNOSIS — I110 Hypertensive heart disease with heart failure: Secondary | ICD-10-CM | POA: Diagnosis not present

## 2018-08-25 DIAGNOSIS — I509 Heart failure, unspecified: Secondary | ICD-10-CM | POA: Insufficient documentation

## 2018-08-25 DIAGNOSIS — M659 Synovitis and tenosynovitis, unspecified: Secondary | ICD-10-CM | POA: Insufficient documentation

## 2018-08-25 DIAGNOSIS — Z90710 Acquired absence of both cervix and uterus: Secondary | ICD-10-CM | POA: Insufficient documentation

## 2018-08-25 DIAGNOSIS — Z7901 Long term (current) use of anticoagulants: Secondary | ICD-10-CM | POA: Insufficient documentation

## 2018-08-25 DIAGNOSIS — M7542 Impingement syndrome of left shoulder: Secondary | ICD-10-CM | POA: Insufficient documentation

## 2018-08-25 DIAGNOSIS — Z8249 Family history of ischemic heart disease and other diseases of the circulatory system: Secondary | ICD-10-CM | POA: Insufficient documentation

## 2018-08-25 DIAGNOSIS — E538 Deficiency of other specified B group vitamins: Secondary | ICD-10-CM | POA: Diagnosis not present

## 2018-08-25 DIAGNOSIS — Z833 Family history of diabetes mellitus: Secondary | ICD-10-CM | POA: Insufficient documentation

## 2018-08-25 DIAGNOSIS — Z820 Family history of epilepsy and other diseases of the nervous system: Secondary | ICD-10-CM | POA: Diagnosis not present

## 2018-08-25 DIAGNOSIS — I4891 Unspecified atrial fibrillation: Secondary | ICD-10-CM | POA: Insufficient documentation

## 2018-08-25 DIAGNOSIS — S43432A Superior glenoid labrum lesion of left shoulder, initial encounter: Secondary | ICD-10-CM | POA: Insufficient documentation

## 2018-08-25 DIAGNOSIS — G4733 Obstructive sleep apnea (adult) (pediatric): Secondary | ICD-10-CM | POA: Insufficient documentation

## 2018-08-25 DIAGNOSIS — E782 Mixed hyperlipidemia: Secondary | ICD-10-CM | POA: Insufficient documentation

## 2018-08-25 DIAGNOSIS — M75112 Incomplete rotator cuff tear or rupture of left shoulder, not specified as traumatic: Secondary | ICD-10-CM | POA: Diagnosis present

## 2018-08-25 DIAGNOSIS — Z79899 Other long term (current) drug therapy: Secondary | ICD-10-CM | POA: Insufficient documentation

## 2018-08-25 DIAGNOSIS — G43909 Migraine, unspecified, not intractable, without status migrainosus: Secondary | ICD-10-CM | POA: Insufficient documentation

## 2018-08-25 DIAGNOSIS — X58XXXA Exposure to other specified factors, initial encounter: Secondary | ICD-10-CM | POA: Insufficient documentation

## 2018-08-25 DIAGNOSIS — M19012 Primary osteoarthritis, left shoulder: Secondary | ICD-10-CM | POA: Diagnosis not present

## 2018-08-25 DIAGNOSIS — I429 Cardiomyopathy, unspecified: Secondary | ICD-10-CM | POA: Insufficient documentation

## 2018-08-25 DIAGNOSIS — M24812 Other specific joint derangements of left shoulder, not elsewhere classified: Secondary | ICD-10-CM | POA: Diagnosis not present

## 2018-08-25 DIAGNOSIS — Z8673 Personal history of transient ischemic attack (TIA), and cerebral infarction without residual deficits: Secondary | ICD-10-CM | POA: Diagnosis not present

## 2018-08-25 HISTORY — DX: Spotted fever due to Rickettsia rickettsii: A77.0

## 2018-08-25 HISTORY — DX: Cardiomyopathy, unspecified: I42.9

## 2018-08-25 HISTORY — DX: Hyperlipidemia, unspecified: E78.5

## 2018-08-25 HISTORY — DX: Elevated white blood cell count, unspecified: D72.829

## 2018-08-25 HISTORY — PX: SHOULDER ARTHROSCOPY WITH OPEN ROTATOR CUFF REPAIR: SHX6092

## 2018-08-25 HISTORY — DX: Obesity, unspecified: E66.9

## 2018-08-25 HISTORY — PX: BICEPT TENODESIS: SHX5116

## 2018-08-25 HISTORY — DX: Deficiency of other specified B group vitamins: E53.8

## 2018-08-25 LAB — BASIC METABOLIC PANEL
ANION GAP: 6 (ref 5–15)
BUN: 15 mg/dL (ref 6–20)
CALCIUM: 8.5 mg/dL — AB (ref 8.9–10.3)
CHLORIDE: 105 mmol/L (ref 98–111)
CO2: 27 mmol/L (ref 22–32)
CREATININE: 0.53 mg/dL (ref 0.44–1.00)
GFR calc Af Amer: >60 mL/min (ref >60)
GFR calc non Af Amer: >60 mL/min (ref >60)
GLUCOSE: 128 mg/dL — AB (ref 70–99)
Potassium: 3.8 mmol/L (ref 3.5–5.1)
Sodium: 138 mmol/L (ref 135–145)

## 2018-08-25 SURGERY — ARTHROSCOPY, SHOULDER WITH REPAIR, ROTATOR CUFF, OPEN
Anesthesia: General | Laterality: Left

## 2018-08-25 MED ORDER — EPINEPHRINE 30 MG/30ML IJ SOLN
INTRAMUSCULAR | Status: AC
  Filled 2018-08-25: qty 1

## 2018-08-25 MED ORDER — OXYCODONE HCL 5 MG PO TABS
ORAL_TABLET | ORAL | Status: AC
  Filled 2018-08-25: qty 1

## 2018-08-25 MED ORDER — LACTATED RINGERS IV SOLN
INTRAVENOUS | Status: DC | PRN
  Administered 2018-08-25: 12 mL

## 2018-08-25 MED ORDER — SUGAMMADEX SODIUM 500 MG/5ML IV SOLN
INTRAVENOUS | Status: DC | PRN
  Administered 2018-08-25: 200 mg via INTRAVENOUS

## 2018-08-25 MED ORDER — MIDAZOLAM HCL 2 MG/2ML IJ SOLN
INTRAMUSCULAR | Status: AC
  Administered 2018-08-25: 1 mg via INTRAVENOUS
  Filled 2018-08-25: qty 2

## 2018-08-25 MED ORDER — BUPIVACAINE HCL (PF) 0.5 % IJ SOLN
INTRAMUSCULAR | Status: AC
  Filled 2018-08-25: qty 10

## 2018-08-25 MED ORDER — MIDAZOLAM HCL 2 MG/2ML IJ SOLN
INTRAMUSCULAR | Status: AC
  Filled 2018-08-25: qty 2

## 2018-08-25 MED ORDER — FENTANYL CITRATE (PF) 100 MCG/2ML IJ SOLN
INTRAMUSCULAR | Status: AC
  Administered 2018-08-25: 50 ug via INTRAVENOUS
  Filled 2018-08-25: qty 2

## 2018-08-25 MED ORDER — SUCCINYLCHOLINE CHLORIDE 20 MG/ML IJ SOLN
INTRAMUSCULAR | Status: AC
  Filled 2018-08-25: qty 1

## 2018-08-25 MED ORDER — OXYCODONE HCL 5 MG/5ML PO SOLN: 5.0000 mg | Freq: Once | ORAL | Status: AC | PRN

## 2018-08-25 MED ORDER — MIDAZOLAM HCL 2 MG/2ML IJ SOLN
INTRAMUSCULAR | Status: DC | PRN
  Administered 2018-08-25: 1 mg via INTRAVENOUS

## 2018-08-25 MED ORDER — CLINDAMYCIN PHOSPHATE 900 MG/50ML IV SOLN
INTRAVENOUS | Status: AC
  Filled 2018-08-25: qty 50

## 2018-08-25 MED ORDER — PHENYLEPHRINE HCL 10 MG/ML IJ SOLN
INTRAMUSCULAR | Status: DC | PRN
  Administered 2018-08-25: 100 ug via INTRAVENOUS
  Administered 2018-08-25: 75 ug via INTRAVENOUS

## 2018-08-25 MED ORDER — LIDOCAINE-EPINEPHRINE 1 %-1:100000 IJ SOLN
INTRAMUSCULAR | Status: AC
  Filled 2018-08-25: qty 1

## 2018-08-25 MED ORDER — ROCURONIUM BROMIDE 100 MG/10ML IV SOLN
INTRAVENOUS | Status: DC | PRN
  Administered 2018-08-25: 5 mg via INTRAVENOUS
  Administered 2018-08-25: 10 mg via INTRAVENOUS
  Administered 2018-08-25: 45 mg via INTRAVENOUS
  Administered 2018-08-25: 20 mg via INTRAVENOUS
  Administered 2018-08-25: 10 mg via INTRAVENOUS

## 2018-08-25 MED ORDER — OXYCODONE HCL 5 MG PO TABS
5.0000 mg | ORAL_TABLET | ORAL | Status: DC | PRN
  Administered 2018-08-25: 5 mg via ORAL

## 2018-08-25 MED ORDER — PROPOFOL 10 MG/ML IV BOLUS
INTRAVENOUS | Status: DC | PRN
  Administered 2018-08-25: 150 mg via INTRAVENOUS
  Administered 2018-08-25: 40 mg via INTRAVENOUS

## 2018-08-25 MED ORDER — FAMOTIDINE 20 MG PO TABS
20.0000 mg | ORAL_TABLET | Freq: Once | ORAL | Status: AC
  Administered 2018-08-25: 20 mg via ORAL

## 2018-08-25 MED ORDER — LIDOCAINE HCL (PF) 2 % IJ SOLN
INTRAMUSCULAR | Status: AC
  Filled 2018-08-25: qty 10

## 2018-08-25 MED ORDER — PHENYLEPHRINE HCL 10 MG/ML IJ SOLN
INTRAMUSCULAR | Status: AC
  Filled 2018-08-25: qty 1

## 2018-08-25 MED ORDER — OXYCODONE HCL 5 MG PO TABS
5.0000 mg | ORAL_TABLET | Freq: Once | ORAL | Status: AC | PRN
  Administered 2018-08-25: 5 mg via ORAL

## 2018-08-25 MED ORDER — FENTANYL CITRATE (PF) 100 MCG/2ML IJ SOLN
INTRAMUSCULAR | Status: AC
  Filled 2018-08-25: qty 2

## 2018-08-25 MED ORDER — ACETAMINOPHEN 10 MG/ML IV SOLN
INTRAVENOUS | Status: DC | PRN
  Administered 2018-08-25: 1000 mg via INTRAVENOUS

## 2018-08-25 MED ORDER — LIDOCAINE-EPINEPHRINE 1 %-1:100000 IJ SOLN
INTRAMUSCULAR | Status: DC | PRN
  Administered 2018-08-25: 5 mL

## 2018-08-25 MED ORDER — FAMOTIDINE 20 MG PO TABS
ORAL_TABLET | ORAL | Status: AC
  Administered 2018-08-25: 20 mg via ORAL
  Filled 2018-08-25: qty 1

## 2018-08-25 MED ORDER — ONDANSETRON HCL 4 MG/2ML IJ SOLN
INTRAMUSCULAR | Status: DC | PRN
  Administered 2018-08-25: 4 mg via INTRAVENOUS

## 2018-08-25 MED ORDER — LACTATED RINGERS IV SOLN
INTRAVENOUS | Status: DC
  Administered 2018-08-25: 08:00:00 via INTRAVENOUS

## 2018-08-25 MED ORDER — SUCCINYLCHOLINE CHLORIDE 20 MG/ML IJ SOLN
INTRAMUSCULAR | Status: DC | PRN
  Administered 2018-08-25: 200 mg via INTRAVENOUS

## 2018-08-25 MED ORDER — BUPIVACAINE LIPOSOME 1.3 % IJ SUSP
INTRAMUSCULAR | Status: DC | PRN
  Administered 2018-08-25: 7 mL via PERINEURAL
  Administered 2018-08-25: 13 mL via PERINEURAL

## 2018-08-25 MED ORDER — BUPIVACAINE HCL (PF) 0.5 % IJ SOLN
INTRAMUSCULAR | Status: DC | PRN
  Administered 2018-08-25: 7 mL via PERINEURAL
  Administered 2018-08-25: 3 mL via PERINEURAL

## 2018-08-25 MED ORDER — LIDOCAINE HCL (PF) 1 % IJ SOLN
INTRAMUSCULAR | Status: AC
  Filled 2018-08-25: qty 5

## 2018-08-25 MED ORDER — ONDANSETRON 4 MG PO TBDP: 4.0000 mg | ORAL_TABLET | Freq: Three times a day (TID) | ORAL | 0 refills | Status: DC | PRN

## 2018-08-25 MED ORDER — DEXAMETHASONE SODIUM PHOSPHATE 10 MG/ML IJ SOLN
INTRAMUSCULAR | Status: DC | PRN
  Administered 2018-08-25: 8 mg via INTRAVENOUS

## 2018-08-25 MED ORDER — SUGAMMADEX SODIUM 200 MG/2ML IV SOLN
INTRAVENOUS | Status: AC
  Filled 2018-08-25: qty 2

## 2018-08-25 MED ORDER — FENTANYL CITRATE (PF) 100 MCG/2ML IJ SOLN
50.0000 ug | Freq: Once | INTRAMUSCULAR | Status: AC
  Administered 2018-08-25: 50 ug via INTRAVENOUS

## 2018-08-25 MED ORDER — EPHEDRINE SULFATE 50 MG/ML IJ SOLN
INTRAMUSCULAR | Status: AC
  Filled 2018-08-25: qty 1

## 2018-08-25 MED ORDER — LIDOCAINE HCL (CARDIAC) PF 100 MG/5ML IV SOSY
PREFILLED_SYRINGE | INTRAVENOUS | Status: DC | PRN
  Administered 2018-08-25: 40 mg via INTRAVENOUS

## 2018-08-25 MED ORDER — ROCURONIUM BROMIDE 50 MG/5ML IV SOLN
INTRAVENOUS | Status: AC
  Filled 2018-08-25: qty 1

## 2018-08-25 MED ORDER — FENTANYL CITRATE (PF) 100 MCG/2ML IJ SOLN
25.0000 ug | INTRAMUSCULAR | Status: DC | PRN
  Administered 2018-08-25 (×2): 50 ug via INTRAVENOUS

## 2018-08-25 MED ORDER — ACETAMINOPHEN 10 MG/ML IV SOLN
INTRAVENOUS | Status: AC
  Filled 2018-08-25: qty 100

## 2018-08-25 MED ORDER — OXYCODONE HCL 5 MG PO TABS: 5.0000 mg | ORAL_TABLET | ORAL | 0 refills | Status: DC | PRN

## 2018-08-25 MED ORDER — DEXAMETHASONE SODIUM PHOSPHATE 10 MG/ML IJ SOLN
INTRAMUSCULAR | Status: AC
  Filled 2018-08-25: qty 1

## 2018-08-25 MED ORDER — FENTANYL CITRATE (PF) 100 MCG/2ML IJ SOLN
INTRAMUSCULAR | Status: DC | PRN
  Administered 2018-08-25: 25 ug via INTRAVENOUS
  Administered 2018-08-25: 50 ug via INTRAVENOUS
  Administered 2018-08-25: 25 ug via INTRAVENOUS

## 2018-08-25 MED ORDER — BUPIVACAINE LIPOSOME 1.3 % IJ SUSP
INTRAMUSCULAR | Status: AC
  Filled 2018-08-25: qty 20

## 2018-08-25 MED ORDER — EPHEDRINE SULFATE 50 MG/ML IJ SOLN
INTRAMUSCULAR | Status: DC | PRN
  Administered 2018-08-25: 10 mg via INTRAVENOUS
  Administered 2018-08-25 (×2): 5 mg via INTRAVENOUS

## 2018-08-25 MED ORDER — ONDANSETRON HCL 4 MG/2ML IJ SOLN
INTRAMUSCULAR | Status: AC
  Filled 2018-08-25: qty 2

## 2018-08-25 MED ORDER — MIDAZOLAM HCL 2 MG/2ML IJ SOLN
1.0000 mg | Freq: Once | INTRAMUSCULAR | Status: AC
  Administered 2018-08-25: 1 mg via INTRAVENOUS

## 2018-08-25 MED ORDER — CLINDAMYCIN PHOSPHATE 900 MG/50ML IV SOLN
900.0000 mg | Freq: Once | INTRAVENOUS | Status: AC
  Administered 2018-08-25: 900 mg via INTRAVENOUS

## 2018-08-25 MED ORDER — PROPOFOL 10 MG/ML IV BOLUS
INTRAVENOUS | Status: AC
  Filled 2018-08-25: qty 20

## 2018-08-25 MED ORDER — BUPIVACAINE HCL (PF) 0.5 % IJ SOLN
INTRAMUSCULAR | Status: DC | PRN
  Administered 2018-08-25: 20 mL

## 2018-08-25 MED ORDER — ACETAMINOPHEN 500 MG PO TABS: 1000.0000 mg | ORAL_TABLET | Freq: Three times a day (TID) | ORAL | 2 refills | Status: AC

## 2018-08-25 SURGICAL SUPPLY — 86 items
ADAPTER IRRIG TUBE 2 SPIKE SOL (ADAPTER) ×6 IMPLANT
ANCHOR ALL-SUT Q-FIX 2.8 (Anchor) ×2 IMPLANT
ANCHOR BONE REGENETEN (Anchor) ×2 IMPLANT
ANCHOR TENDON REGENETEN (Staple) ×2 IMPLANT
BLADE OSCILLATING/SAGITTAL (BLADE)
BLADE SW THK.38XMED LNG THN (BLADE) IMPLANT
BUR BR 5.5 12 FLUTE (BURR) ×2 IMPLANT
BUR RADIUS 4.0X18.5 (BURR) ×2 IMPLANT
CANNULA 5.75X7CM (CANNULA)
CANNULA PART THRD DISP 5.75X7 (CANNULA) IMPLANT
CANNULA PARTIAL THREAD 2X7 (CANNULA) ×2 IMPLANT
CANNULA TWIST IN 8.25X9CM (CANNULA) IMPLANT
CHLORAPREP W/TINT 26ML (MISCELLANEOUS) ×3 IMPLANT
CLOSURE WOUND 1/2 X4 (GAUZE/BANDAGES/DRESSINGS)
COOLER POLAR GLACIER W/PUMP (MISCELLANEOUS) ×3 IMPLANT
COVER LIGHT HANDLE STERIS (MISCELLANEOUS) ×3 IMPLANT
COVER WAND RF STERILE (DRAPES) ×1 IMPLANT
CRADLE LAMINECT ARM (MISCELLANEOUS) ×6 IMPLANT
DERMABOND ADVANCED (GAUZE/BANDAGES/DRESSINGS)
DERMABOND ADVANCED .7 DNX12 (GAUZE/BANDAGES/DRESSINGS) IMPLANT
DRAPE IMP U-DRAPE 54X76 (DRAPES) ×6 IMPLANT
DRAPE INCISE IOBAN 66X45 STRL (DRAPES) ×3 IMPLANT
DRAPE SHEET LG 3/4 BI-LAMINATE (DRAPES) ×3 IMPLANT
DRAPE STERI 35X30 U-POUCH (DRAPES) ×3 IMPLANT
DRAPE U-SHAPE 47X51 STRL (DRAPES) ×3 IMPLANT
DRSG TEGADERM 4X4.75 (GAUZE/BANDAGES/DRESSINGS) ×8 IMPLANT
ELECT REM PT RETURN 9FT ADLT (ELECTROSURGICAL) ×3
ELECTRODE REM PT RTRN 9FT ADLT (ELECTROSURGICAL) ×1 IMPLANT
GAUZE PETRO XEROFOAM 1X8 (MISCELLANEOUS) ×3 IMPLANT
GAUZE SPONGE 4X4 12PLY STRL (GAUZE/BANDAGES/DRESSINGS) ×3 IMPLANT
GLOVE BIOGEL PI IND STRL 8 (GLOVE) ×1 IMPLANT
GLOVE BIOGEL PI INDICATOR 8 (GLOVE) ×2
GLOVE SURG SYN 8.0 (GLOVE) ×3 IMPLANT
GLOVE SURG SYN 8.0 PF PI (GLOVE) ×1 IMPLANT
GOWN STRL REUS W/ TWL LRG LVL3 (GOWN DISPOSABLE) ×1 IMPLANT
GOWN STRL REUS W/TWL LRG LVL3 (GOWN DISPOSABLE) ×2
GOWN STRL REUS W/TWL LRG LVL4 (GOWN DISPOSABLE) ×3 IMPLANT
IMPL REGENETEN MEDIUM (Shoulder) IMPLANT
IMPLANT REGENETEN MEDIUM (Shoulder) ×3 IMPLANT
IV LACTATED RINGER IRRG 3000ML (IV SOLUTION) ×24
IV LR IRRIG 3000ML ARTHROMATIC (IV SOLUTION) ×4 IMPLANT
KIT STABILIZATION SHOULDER (MISCELLANEOUS) ×3 IMPLANT
KIT SUTURE 2.8 Q-FIX DISP (MISCELLANEOUS) ×2 IMPLANT
KIT TURNOVER KIT A (KITS) ×3 IMPLANT
MANIFOLD NEPTUNE II (INSTRUMENTS) ×3 IMPLANT
MASK FACE SPIDER DISP (MASK) ×3 IMPLANT
MAT ABSORB  FLUID 56X50 GRAY (MISCELLANEOUS) ×4
MAT ABSORB FLUID 56X50 GRAY (MISCELLANEOUS) ×2 IMPLANT
NDL MAYO 6 CRC TAPER PT (NEEDLE) IMPLANT
NDL SAFETY ECLIPSE 18X1.5 (NEEDLE) ×1 IMPLANT
NDL SPNL 18GX3.5 QUINCKE PK (NEEDLE) IMPLANT
NEEDLE HYPO 18GX1.5 SHARP (NEEDLE) ×2
NEEDLE HYPO 22GX1.5 SAFETY (NEEDLE) ×3 IMPLANT
NEEDLE MAYO 6 CRC TAPER PT (NEEDLE) ×3 IMPLANT
NEEDLE SPNL 18GX3.5 QUINCKE PK (NEEDLE) ×3 IMPLANT
PACK ARTHROSCOPY SHOULDER (MISCELLANEOUS) ×3 IMPLANT
PAD ABD DERMACEA PRESS 5X9 (GAUZE/BANDAGES/DRESSINGS) ×3 IMPLANT
PAD WRAPON POLAR SHDR XLG (MISCELLANEOUS) ×1 IMPLANT
SET TUBE SUCT SHAVER OUTFL 24K (TUBING) ×3 IMPLANT
SET TUBE TIP INTRA-ARTICULAR (MISCELLANEOUS) ×3 IMPLANT
SLING ULTRA II M (MISCELLANEOUS) ×3 IMPLANT
STAPLER SKIN PROX 35W (STAPLE) IMPLANT
STRAP SAFETY 5IN WIDE (MISCELLANEOUS) ×3 IMPLANT
STRIP CLOSURE SKIN 1/2X4 (GAUZE/BANDAGES/DRESSINGS) IMPLANT
SUT ETHILON 3-0 (SUTURE) ×2 IMPLANT
SUT ETHILON 4-0 (SUTURE) ×2
SUT ETHILON 4-0 FS2 18XMFL BLK (SUTURE) ×1
SUT LASSO 90 DEG SD STR (SUTURE) IMPLANT
SUT MNCRL 4-0 (SUTURE) ×2
SUT MNCRL 4-0 27XMFL (SUTURE) ×1
SUT PROLENE 0 CT 2 (SUTURE) ×2 IMPLANT
SUT TICRON 2-0 30IN 311381 (SUTURE) IMPLANT
SUT VIC AB 0 CT1 36 (SUTURE) IMPLANT
SUT VIC AB 2-0 CT2 27 (SUTURE) IMPLANT
SUT VICRYL 3-0 27IN (SUTURE) IMPLANT
SUTURE ETHLN 4-0 FS2 18XMF BLK (SUTURE) ×1 IMPLANT
SUTURE MNCRL 4-0 27XMF (SUTURE) IMPLANT
SYR 10ML LL (SYRINGE) ×3 IMPLANT
TAPE CLOTH 3X10 WHT NS LF (GAUZE/BANDAGES/DRESSINGS) ×3 IMPLANT
TAPE MICROFOAM 4IN (TAPE) ×3 IMPLANT
TUBING ARTHRO INFLOW-ONLY STRL (TUBING) ×3 IMPLANT
TUBING CONNECTING 10 (TUBING) ×2 IMPLANT
TUBING CONNECTING 10' (TUBING) ×1
WAND HAND CNTRL MULTIVAC 90 (MISCELLANEOUS) ×1 IMPLANT
WAND WEREWOLF FLOW 90D (MISCELLANEOUS) ×2 IMPLANT
WRAPON POLAR PAD SHDR XLG (MISCELLANEOUS) ×3

## 2018-08-25 NOTE — Transfer of Care (Signed)
Immediate Anesthesia Transfer of Care Note  Patient: Michelle Norris  Procedure(s) Performed: SHOULDER ARTHROSCOPY WITH SUBCROMIAL DECOMPRESSION  DISTAL CLAVICLE EXCISION VS.OPEN ROTATOR CUFF REPAIR (Left ) BICEPS TENODESIS (Left )  Patient Location: PACU  Anesthesia Type:General  Level of Consciousness: awake and alert   Airway & Oxygen Therapy: Patient Spontanous Breathing and Patient connected to face mask oxygen  Post-op Assessment: Report given to RN and Post -op Vital signs reviewed and stable  Post vital signs: Reviewed and stable  Last Vitals:  Vitals Value Taken Time  BP 104/58 08/25/2018 11:10 AM  Temp 36.3 C 08/25/2018 11:10 AM  Pulse 83 08/25/2018 11:15 AM  Resp 21 08/25/2018 11:15 AM  SpO2 98 % 08/25/2018 11:15 AM  Vitals shown include unvalidated device data.  Last Pain:  Vitals:   08/25/18 1110  TempSrc:   PainSc: 0-No pain         Complications: No apparent anesthesia complications

## 2018-08-25 NOTE — Anesthesia Post-op Follow-up Note (Signed)
Anesthesia QCDR form completed.        
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
## 2018-08-25 NOTE — Anesthesia Procedure Notes (Signed)
Anesthesia Regional Block: Interscalene brachial plexus block   Pre-Anesthetic Checklist: ,, timeout performed, Correct Patient, Correct Site, Correct Laterality, Correct Procedure, Correct Position, site marked, Risks and benefits discussed,  Surgical consent,  Pre-op evaluation,  At surgeon's request and post-op pain management  Laterality: Upper and Left  Prep: chloraprep       Needles:  Injection technique: Single-shot  Needle Type: Stimiplex     Needle Length: 5cm  Needle Gauge: 22     Additional Needles:   Procedures:,,,, ultrasound used (permanent image in chart),,,,  Narrative:  Start time: 08/25/2018 7:09 AM End time: 08/25/2018 7:13 AM Injection made incrementally with aspirations every 5 mL.  Performed by: Personally  Anesthesiologist: Piscitello, Precious Haws, MD  Additional Notes: Functioning IV was confirmed and monitors were applied.  A 60mm 22ga Stimuplex needle was used. Sterile prep,hand hygiene and sterile gloves were used.  Minimal sedation used for procedure.  No paresthesia endorsed by patient during the procedure.  Negative aspiration and negative test dose prior to incremental administration of local anesthetic. The patient tolerated the procedure well with no immediate complications.

## 2018-08-25 NOTE — Anesthesia Procedure Notes (Addendum)
Procedure Name: Intubation Date/Time: 08/25/2018 7:41 AM Performed by: Allean Found, CRNA Pre-anesthesia Checklist: Patient identified, Patient being monitored, Timeout performed, Emergency Drugs available and Suction available Patient Re-evaluated:Patient Re-evaluated prior to induction Oxygen Delivery Method: Circle system utilized Preoxygenation: Pre-oxygenation with 100% oxygen Induction Type: IV induction Ventilation: Mask ventilation without difficulty Laryngoscope Size: 3 and McGraph Grade View: Grade I Tube type: Oral Tube size: 7.0 mm Number of attempts: 1 Airway Equipment and Method: Stylet Placement Confirmation: ETT inserted through vocal cords under direct vision,  positive ETCO2 and breath sounds checked- equal and bilateral Secured at: 21 cm Tube secured with: Tape Dental Injury: Teeth and Oropharynx as per pre-operative assessment

## 2018-08-25 NOTE — Anesthesia Postprocedure Evaluation (Signed)
Anesthesia Post Note  Patient: [name]  Procedure(s) Performed: SHOULDER ARTHROSCOPY WITH SUBCROMIAL DECOMPRESSION  DISTAL CLAVICLE EXCISION VS.OPEN ROTATOR CUFF REPAIR (Left ) BICEPS TENODESIS (Left )  Patient location during evaluation: PACU Anesthesia Type: General Level of consciousness: awake and alert Pain management: pain level controlled Vital Signs Assessment: post-procedure vital signs reviewed and stable Respiratory status: spontaneous breathing, nonlabored ventilation, respiratory function stable and patient connected to nasal cannula oxygen Cardiovascular status: blood pressure returned to baseline and stable Postop Assessment: no apparent nausea or vomiting Anesthetic complications: no     Last Vitals:  Vitals:   08/25/18 1155 08/25/18 1210  BP: 112/63 118/77  Pulse: 77 81  Resp: 18 18  Temp:    SpO2: 96% 97%    Last Pain:  Vitals:   08/25/18 1236  TempSrc: Temporal  PainSc: 3                  Precious Haws Leverett [place]in

## 2018-08-25 NOTE — H&P (Signed)
Paper H&P to be scanned into permanent record. H&P reviewed. No significant changes noted.  

## 2018-08-25 NOTE — Discharge Instructions (Addendum)
Post-Op Instructions - Arthroscopic Shoulder Surgery with Biceps Tenodesis  1. Bracing: You will wear a shoulder immobilizer or sling for 4 weeks.   2. Driving: When driving, do not wear the immobilizer. Ideally, we recommend no driving for 4 weeks while sling is in place as one arm will be immobilized.   3. Activity: No active lifting for 6 weeks. Wrist, hand, and elbow motion only. You are permitted to bend and straighten the elbow passively only (no active elbow motion). You may use your hand and wrist for typing, writing, and managing utensils (cutting food). Do not lift more than a coffee cup for 8 weeks.  When sleeping or resting, inclined positions (recliner chair or wedge pillow) and a pillow under the forearm for support may provide better comfort for up to 4 weeks.  Avoid long distance travel for 4 weeks.  Return to normal activities normally takes 4 months on average. If rehab goes very well, may be able to do most activities at 3 months, except overhead or contact sports.  4. Physical Therapy: Begins 3-4 days after surgery, and proceed 1 time per week for the first 6 weeks, then 1-2 times per week from weeks 6-20 post-op.  5. Medications:  - You will be provided a prescription for narcotic pain medicine. After surgery, take 1-2 narcotic tablets every 4 hours if needed for severe pain.  - A prescription for anti-nausea medication will be provided in case the narcotic medicine causes nausea - take 1 tablet every 6 hours only if nauseated.   - Take tylenol 1000 mg (2 Extra Strength tablets or 3 regular strength) every 8 hours for pain.  May decrease or stop tylenol 5 days after surgery if you are having minimal pain. - Resume home Eliquis starting the morning after surgery.    If you are taking prescription medication for anxiety, depression, insomnia, muscle spasm, chronic pain, or for attention deficit disorder, you are advised that you are at a higher risk of adverse effects with use  of narcotics post-op, including narcotic addiction/dependence, depressed breathing, death. If you use non-prescribed substances: alcohol, marijuana, cocaine, heroin, methamphetamines, etc., you are at a higher risk of adverse effects with use of narcotics post-op, including narcotic addiction/dependence, depressed breathing, death. You are advised that taking > 50 morphine milligram equivalents (MME) of narcotic pain medication per day results in twice the risk of overdose or death. For your prescription provided: oxycodone 5 mg - taking more than 6 tablets per day would result in > 50 morphine milligram equivalents (MME) of narcotic pain medication. Be advised that we will prescribe narcotics short-term, for acute post-operative pain only - 3 weeks for major operations such as shoulder repair/reconstruction surgeries.    6. Post-Op Appointment:  Your first post-op appointment will be 10-14 days post-op.  7. Work or School: For most, but not all procedures, we advise staying out of work or school for at least 1 to 2 weeks in order to recover from the stress of surgery and to allow time for healing.   If you need a work or school note this can be provided.   8. Smoking: If you are a smoker, you need to refrain from smoking in the postoperative period. The nicotine in cigarettes will inhibit healing of your shoulder repair and decrease the chance of successful repair. Similarly, nicotine containing products (gum, patches) should be avoided.   Post-operative Brace: Apply and remove the brace you received as you were instructed to at the time  of fitting and as described in detail as the braces instructions for use indicate.  Wear the brace for the period of time prescribed by your physician.  The brace can be cleaned with soap and water and allowed to air dry only.  Should the brace result in increased pain, decreased feeling (numbness/tingling), increased swelling or an overall worsening of your  medical condition, please contact your doctor immediately.  If an emergency situation occurs as a result of wearing the brace after normal business hours, please dial 911 and seek immediate medical attention.  Let your doctor know if you have any further questions about the brace issued to you. Refer to the shoulder sling instructions for use if you have any questions regarding the correct fit of your shoulder sling.  [facility]: [phone]  Video that illustrates how to properly use a shoulder sling: "Instructions for Proper Use of an Orthopaedic Sling" [email or web address]    Interscalene Nerve Block, Care After This sheet gives you information about how to care for yourself after your procedure. Your health care provider may also give you more specific instructions. If you have problems or questions, contact your health care provider. What can I expect after the procedure? After the procedure, it is common to have:  Soreness or tenderness in your neck.  Numbness in your shoulder, upper arm, and some fingers.  Weakness in your shoulder and arm muscles.  The feeling and strength in your shoulder, arm, and fingers should return to normal within hours after your procedure. Follow these instructions at home: For at least 24 hours after the procedure:  Do not: ? Participate in activities in which you could fall or become injured. ? Drive. ? Use heavy machinery. ? Drink alcohol. ? Take sleeping pills or medicines that cause drowsiness. ? Make important decisions or sign legal documents. ? Take care of children on your own.  Rest. Eating and drinking  If you vomit, drink water, juice, or soup when you can drink without vomiting.  Make sure you have little or no nausea before eating solid foods.  Follow the diet that is recommended by your health care provider. If you have a sling:  Wear it as told by your health care  provider. Remove it only as told by your health care provider.  Loosen the sling if your fingers tingle, become numb, or turn cold and blue.  Make sure that your entire arm, including your wrist, is supported. Do not allow your wrist to dangle over the end of the sling.  Do not let your sling get wet if it is not waterproof.  Keep the sling clean. Bathing  Do not take baths, swim, or use a hot tub until your health care provider approves.  If you have a nerve block catheter in place, keep the incision site and tubing dry. Injection site care  Wash your hands with soap and water before you change your bandage (dressing). If soap and water are not available, use hand sanitizer.  Change your dressing as told by your health care provider.  Keep your dressing dry.  Check your nerve block injection site every day for signs of infection. Check for: ? Redness, swelling, or pain. ? Fluid or blood. ? Warmth. Activity  Do not perform complex or risky activities while taking prescription pain medicine and until you have fully recovered.  Return to your normal activities as told by your health care provider and as you can tolerate them. Ask your  health care provider what activities are safe for you.  Rest and take it easy. This will help you heal and recover more quickly and fully.  Be very cautious until you have regained strength and sensation. General instructions  Have a responsible adult stay with you until you are awake and alert.  Do not drive or use heavy machinery while taking prescription pain medicine and until you have fully recovered. Ask your health care provider when it is safe to drive.  Take over-the-counter and prescription medicines only as told by your health care provider.  If you smoke, do not smoke without supervision.  Do not expose your arm or shoulder to very cold or very hot temperatures until you have full feeling back.  If you have a nerve block catheter  in place: ? Try to keep the catheter from getting kinked or pinched. ? Avoid pulling or tugging on the catheter.  Keep all follow-up visits as told by your health care provider. This is important. Contact a health care provider if:  You have chills or fever.  You have redness, swelling, or pain around your injection site.  You have fluid or blood coming from the injection site.  The skin around the injection site is warm to the touch.  There is a bad smell coming from your dressing.  You have hoarseness or a drooping or dry eye that lasts more than a few days.  You have pain that is poorly controlled with the block or with pain medicine.  You have numbness, tingling, or weakness in your shoulder or arm that lasts for more than one week. Get help right away if:  You have severe pain.  You lose or do not regain strength and sensation in your arm even after the nerve block medicine has stopped.  You have trouble breathing.  You have a nerve block catheter still in place and you begin to shiver.  You have a nerve block catheter still in place and you are getting more and more numb or weak. This information is not intended to replace advice given to you by your health care provider. Make sure you discuss any questions you have with your health care provider. Document Released: 09/16/2015 Document Revised: 05/25/2016 Document Reviewed: 05/25/2016 Elsevier Interactive Patient Education  2018 [place]   1) The drugs that you were given will stay in your system until tomorrow so for the next 24 hours you should not:  A) Drive an automobile B) Make any legal decisions C) Drink any alcoholic beverage   2) You may resume regular meals tomorrow.  Today it is better to start with liquids and gradually work up to solid foods.  You may eat anything you prefer, but it is better to start with liquids, then soup and crackers, and  gradually work up to solid foods.   3) Please notify your doctor immediately if you have any unusual bleeding, trouble breathing, redness and pain at the surgery site, drainage, fever, or pain not relieved by medication.    4) Additional Instructions:        Please contact your physician with any problems or Same Day Surgery at [phone], Monday through Friday 6 am to 4 pm, or [place] at [facility] number at [phone].

## 2018-08-25 NOTE — Op Note (Signed)
SURGERY DATE: 08/25/2018  PRE-OP DIAGNOSIS:  1. Left subacromial impingement 2. Left biceps tendinopathy 3. Left partial thickness rotator cuff tear 4. Left acromioclavicular joint osteoarthritis  POST-OP DIAGNOSIS: 1. Left subacromial impingement 2. Left biceps tendinopathy 3. Left rotator cuff tear 4. Left acromioclavicular joint osteoarthritis  PROCEDURES:  1. Left mini-open rotator cuff repair with Regeneten patch 2. Left open biceps tenodesis 3. Left distal clavicle excision 4. Left extensive debridement of shoulder (glenohumeral and subacromial spaces) 5. Left subacromial decompression  SURGEON: Cato Mulligan, MD  ANESTHESIA: Gen with interscalene block w/Exparil  ESTIMATED BLOOD LOSS: 25cc  DRAINS:  none  TOTAL IV FLUIDS: per anesthesia   SPECIMENS: none  IMPLANTS:  - [employer] Regeneten patch with associated tendon and bone staples - [employer] Q-fix all-suture Anchor - x1  OPERATIVE FINDINGS:  Examination under anesthesia: A careful examination under anesthesia was performed.  Passive range of motion was: FF: 150; ER at side: 45; ER in abduction: 90; IR in abduction: 50.  Anterior load shift: NT.  Posterior load shift: NT.  Sulcus in neutral: NT.  Sulcus in ER: NT.    Intra-operative findings: A thorough arthroscopic examination of the shoulder was performed.  The findings are: 1. Biceps tendon: tendinopathy with erythema 2. Superior labrum: Type 2 SLAP tear 3. Posterior labrum and capsule: Slight fraying of the labrum 4. Inferior capsule and inferior recess: Slight fraying of the labrum 5. Glenoid cartilage surface: Normal 6. Supraspinatus attachment: partial thickness articular sided tearing of entire anterior to posterior aspect of supraspinatus involving ~10% of the articular surface.  Additionally there was significant bursal sided tearing of the supraspinatus in this region as well as the distal insertion. 7. Posterior rotator cuff attachment:  normal 8. Humeral head articular cartilage: normal 9. Rotator interval: Synovitis 10: Subscapularis tendon: Normal attachment 11. Anterior labrum: degenerative 12. IGHL: normal  OPERATIVE REPORT:   Indications for procedure: Michelle Norris is a 47 y.o. year old female with over 1 year of shoulder pain. She has failed nonoperative management including rest, activity modification, physical therapy, and/or corticosteroid injection. Clinical exam and MRI were concerning for a partial thickness rotator cuff tear with significant AC joint arthritis and biceps tendinopathy. After discussion of risks, benefits, and alternatives to surgery, the patient elected to proceed with above mentioned procedure. The patient understands that use of the Regeneten patch is relatively new and long-term data is unknown.  Procedure in detail:  I identified Michelle Norris in the pre-operative holding area.  I marked the operative shoulder with my initials. I reviewed the risks and benefits of the proposed surgical intervention, and the patient (and/or patient's guardian) wished to proceed.  Anesthesia was then performed with an interscalene block with Exparil.  The patient was transferred to the operative suite and placed in the beach chair position.    SCDs were placed on the lower extremities. Appropriate IV antibiotics were administered. The operative upper extremity was then prepped and draped in standard fashion. A time out was performed confirming the correct extremity, correct patient and correct procedure.   I then created a standard posterior portal with an 11 blade. The glenohumeral joint was entered with a blunt trochar and the arthroscope introduced. The findings of diagnostic arthroscopy are described above. I debrided the degenerative anterior labrum, inferior labrum, and posterior labrum and also debrided and coagulated the inflamed synovium to obtain hemostasis and reduce the risk of post-operative swelling using  an Arthrocare radiofrequency device. I performed a biceps  tenotomy using an arthroscopic scissors and used a motorized shaver to debride the stump back to a stable base.  A spinal needle was placed in the anterior and medial portion of the partial thickness rotator cuff tear and an 0-PDS suture was passed through the needle and retrieved out of the anterior portal.  Next, the arthroscope was then introduced into the subacromial space. A direct lateral portal was created with an 11-blade after spinal needle localization. An extensive subacromial bursectomy was performed using a combination of the shaver and Arthrocare wand. The entire acromial undersurface was exposed and the CA ligament was subperiosteally elevated to expose the prominent anterior acromial hook. A burr was used to create a flat anterior and lateral aspect of the acromion, converting it from a Type 2 to a Type 1 acromion. Care was made to keep the deltoid fascia intact.  Next, I exposed the acromioclavicular joint using a combination of shaver and arthrocare wand. The distal 35mm of clavicle was removed using a 5.29mm burr (2 burr widths removed). Adequate resection was confirmed by placing the camera into the anterior portal and by using a probe to measure the distance between the acromion and distal clavicle. Care was taken to preserve the superior and posterior capsule. Hemostasis was achieved and fluid was evacuated from the shoulder.   A longitudinal incision from the anterolateral acromion ~8cm in length was made overlying the raphe between the anterior and middle heads of the deltoid. The raphe was identified and it was incised. The subacromial space was identified. Any remaining bursa was excised. We then turned our attention to the biceps tenodesis. The arm was externally rotated.  The bicipital groove was identified.  A 15 blade was used to make a cut overlying the biceps tendon, and the tendon was removed using a right angle clamp.   The base of the bicipital groove was identified and cleared of soft tissue.  A Q fix anchor was placed in the bicipital groove.  The biceps tendon was held at the appropriate amount of tension.  One set of sutures was passed through the biceps anchor with one limb passed in a simple fashion and the second limb passed in a simple plus locking stitch pattern. This was repeated for the other set of sutures.  This construct allowed for shuttling the biceps tendon down to the bone.  The sutures were tied and cut.  The diseased portion of the proximal biceps was then excised.  The arm was then internally rotated.  The bursal side of the rotator cuff was intact except small areas of partial thickness tearing about the previously marked site of the partial thickness articular sided tear as well as the distal supraspinatus attachment. There regions were probed, and there was no full-thickness tear. We decided to proceed with Regeneten patch placement. The Regeneten patch delivery gun was placed appropriately and the patch was delivered over the supraspinatus tendon. It was positioned such that all areas of partial-thickness rotator cuff tear were covered. Tendon staples were placed medially, anteriorly, and posteriorly. Two bone staples were then placed laterally. The patch was then probed to confirm appropriate stability.   The wound was thoroughly irrigated.  The deltoid split was closed with 0 Vicryl.  The subdermal layer was closed with 2-0 Vicryl.  The skin was closed with 4-0 Monocryl and Dermabond. The portals were closed with 3-0 Nylon. Xeroform was applied to the portals. A sterile dressing was applied, followed by a Polar Care sleeve and a SlingShot  shoulder immobilizer/sling. The patient awoke from anesthesia without difficulty and was transferred to the PACU in stable condition.    COMPLICATIONS: none  DISPOSITION: plan for discharge home after recovery in PACU  POSTOPERATIVE PLAN: Remain in sling  (except hygiene and elbow/wrist/hand RoM exercises as instructed by PT) x 4 weeks and NWB for this time. PT to begin 3-4 days after surgery. Use biceps tenodesis and distal clavicle excision protocol as Regeneten patch protocol is too aggressive for biceps tenodesis healing.

## 2018-08-26 ENCOUNTER — Inpatient Hospital Stay: Admission: RE | Admit: 2018-08-26 | Payer: Commercial Managed Care - PPO | Source: Ambulatory Visit

## 2018-08-26 ENCOUNTER — Encounter: Payer: Self-pay | Admitting: Orthopedic Surgery

## 2018-10-16 ENCOUNTER — Inpatient Hospital Stay: Payer: Commercial Managed Care - PPO | Attending: Oncology | Admitting: Oncology

## 2018-10-16 ENCOUNTER — Inpatient Hospital Stay: Payer: Commercial Managed Care - PPO

## 2018-10-16 ENCOUNTER — Encounter: Payer: Self-pay | Admitting: Oncology

## 2018-10-16 ENCOUNTER — Other Ambulatory Visit: Payer: Self-pay

## 2018-10-16 ENCOUNTER — Encounter (INDEPENDENT_AMBULATORY_CARE_PROVIDER_SITE_OTHER): Payer: Self-pay

## 2018-10-16 VITALS — BP 124/76 | HR 77 | Temp 98.3°F | Resp 20 | Wt 307.9 lb

## 2018-10-16 DIAGNOSIS — Z7901 Long term (current) use of anticoagulants: Secondary | ICD-10-CM | POA: Diagnosis not present

## 2018-10-16 DIAGNOSIS — G4733 Obstructive sleep apnea (adult) (pediatric): Secondary | ICD-10-CM | POA: Insufficient documentation

## 2018-10-16 DIAGNOSIS — Z7982 Long term (current) use of aspirin: Secondary | ICD-10-CM | POA: Insufficient documentation

## 2018-10-16 DIAGNOSIS — I4891 Unspecified atrial fibrillation: Secondary | ICD-10-CM | POA: Insufficient documentation

## 2018-10-16 DIAGNOSIS — Z79899 Other long term (current) drug therapy: Secondary | ICD-10-CM | POA: Diagnosis not present

## 2018-10-16 DIAGNOSIS — D72829 Elevated white blood cell count, unspecified: Secondary | ICD-10-CM

## 2018-10-16 DIAGNOSIS — Z8673 Personal history of transient ischemic attack (TIA), and cerebral infarction without residual deficits: Secondary | ICD-10-CM | POA: Insufficient documentation

## 2018-10-16 DIAGNOSIS — I10 Essential (primary) hypertension: Secondary | ICD-10-CM | POA: Diagnosis not present

## 2018-10-16 LAB — CBC WITH DIFFERENTIAL/PLATELET
Abs Immature Granulocytes: 0.05 10*3/uL (ref 0.00–0.07)
BASOS PCT: 1 %
Basophils Absolute: 0 10*3/uL (ref 0.0–0.1)
EOS ABS: 0.2 10*3/uL (ref 0.0–0.5)
Eosinophils Relative: 3 %
HCT: 38.8 % (ref 36.0–46.0)
Hemoglobin: 12.4 g/dL (ref 12.0–15.0)
IMMATURE GRANULOCYTES: 1 %
Lymphocytes Relative: 30 %
Lymphs Abs: 2.4 10*3/uL (ref 0.7–4.0)
MCH: 29.8 pg (ref 26.0–34.0)
MCHC: 32 g/dL (ref 30.0–36.0)
MCV: 93.3 fL (ref 80.0–100.0)
MONOS PCT: 8 %
Monocytes Absolute: 0.6 10*3/uL (ref 0.1–1.0)
NEUTROS PCT: 57 %
NRBC: 0 % (ref 0.0–0.2)
Neutro Abs: 4.7 10*3/uL (ref 1.7–7.7)
PLATELETS: 246 10*3/uL (ref 150–400)
RBC: 4.16 MIL/uL (ref 3.87–5.11)
RDW: 13.1 % (ref 11.5–15.5)
WBC: 8 10*3/uL (ref 4.0–10.5)

## 2018-10-16 NOTE — Progress Notes (Signed)
Recent L shoulder surgery. Rehabing well. Has ongoing pain down L arm into L hand. Denies fevers or night sweats. Appetite and energy are normal.
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
## 2018-12-05 ENCOUNTER — Other Ambulatory Visit: Payer: Self-pay | Admitting: Family Medicine

## 2018-12-05 DIAGNOSIS — M5412 Radiculopathy, cervical region: Secondary | ICD-10-CM

## 2018-12-14 ENCOUNTER — Other Ambulatory Visit: Payer: Self-pay

## 2018-12-14 ENCOUNTER — Ambulatory Visit
Admission: RE | Admit: 2018-12-14 | Discharge: 2018-12-14 | Disposition: A | Discharge status: Home / Self Care | Payer: Commercial Managed Care - PPO | Source: Ambulatory Visit | Attending: Family Medicine | Admitting: Family Medicine

## 2018-12-14 DIAGNOSIS — M5412 Radiculopathy, cervical region: Secondary | ICD-10-CM | POA: Diagnosis not present

## 2018-12-16 ENCOUNTER — Ambulatory Visit: Payer: Commercial Managed Care - PPO | Admitting: Psychology

## 2018-12-16 DIAGNOSIS — F4310 Post-traumatic stress disorder, unspecified: Secondary | ICD-10-CM | POA: Diagnosis not present

## 2018-12-23 ENCOUNTER — Other Ambulatory Visit: Payer: Self-pay | Admitting: Physician Assistant

## 2019-01-08 ENCOUNTER — Ambulatory Visit: Payer: Commercial Managed Care - PPO | Admitting: Psychology

## 2019-01-15 ENCOUNTER — Ambulatory Visit: Payer: Commercial Managed Care - PPO | Admitting: Psychology

## 2019-01-22 ENCOUNTER — Ambulatory Visit: Payer: Commercial Managed Care - PPO | Admitting: Psychology

## 2019-02-17 ENCOUNTER — Ambulatory Visit: Payer: Commercial Managed Care - PPO | Admitting: Psychology

## 2019-02-24 ENCOUNTER — Ambulatory Visit: Payer: Commercial Managed Care - PPO | Admitting: Psychology

## 2019-03-03 ENCOUNTER — Ambulatory Visit (INDEPENDENT_AMBULATORY_CARE_PROVIDER_SITE_OTHER): Payer: Commercial Managed Care - PPO | Admitting: Psychology

## 2019-03-03 DIAGNOSIS — F4310 Post-traumatic stress disorder, unspecified: Secondary | ICD-10-CM

## 2019-03-12 ENCOUNTER — Ambulatory Visit (INDEPENDENT_AMBULATORY_CARE_PROVIDER_SITE_OTHER): Payer: Commercial Managed Care - PPO | Admitting: Psychology

## 2019-03-12 DIAGNOSIS — F4310 Post-traumatic stress disorder, unspecified: Secondary | ICD-10-CM

## 2019-03-27 ENCOUNTER — Ambulatory Visit (INDEPENDENT_AMBULATORY_CARE_PROVIDER_SITE_OTHER): Payer: Commercial Managed Care - PPO | Admitting: Psychology

## 2019-03-27 DIAGNOSIS — F4310 Post-traumatic stress disorder, unspecified: Secondary | ICD-10-CM | POA: Diagnosis not present

## 2019-04-10 ENCOUNTER — Ambulatory Visit: Payer: Self-pay | Admitting: Psychology

## 2019-04-11 ENCOUNTER — Ambulatory Visit: Payer: Commercial Managed Care - PPO | Admitting: Psychology

## 2019-05-18 ENCOUNTER — Other Ambulatory Visit: Payer: Self-pay | Admitting: Physician Assistant

## 2019-05-18 DIAGNOSIS — Z1231 Encounter for screening mammogram for malignant neoplasm of breast: Secondary | ICD-10-CM

## 2019-06-17 ENCOUNTER — Encounter: Payer: Self-pay | Admitting: Oncology

## 2019-07-28 ENCOUNTER — Ambulatory Visit
Admission: RE | Admit: 2019-07-28 | Discharge: 2019-07-28 | Disposition: A | Discharge status: Home / Self Care | Payer: Commercial Managed Care - PPO | Source: Ambulatory Visit | Attending: Physician Assistant | Admitting: Physician Assistant

## 2019-07-28 DIAGNOSIS — Z1231 Encounter for screening mammogram for malignant neoplasm of breast: Secondary | ICD-10-CM | POA: Insufficient documentation

## 2019-10-19 ENCOUNTER — Encounter: Admission: RE | Payer: Self-pay | Source: Home / Self Care

## 2019-10-19 ENCOUNTER — Ambulatory Visit
Admission: RE | Admit: 2019-10-19 | Payer: Managed Care, Other (non HMO) | Source: Home / Self Care | Admitting: Internal Medicine

## 2019-10-19 SURGERY — COLONOSCOPY WITH PROPOFOL
Anesthesia: General

## 2020-02-12 ENCOUNTER — Other Ambulatory Visit: Payer: Self-pay | Admitting: Neurology

## 2020-02-12 DIAGNOSIS — R2689 Other abnormalities of gait and mobility: Secondary | ICD-10-CM

## 2020-02-12 DIAGNOSIS — I639 Cerebral infarction, unspecified: Secondary | ICD-10-CM

## 2020-03-12 ENCOUNTER — Ambulatory Visit
Admission: RE | Admit: 2020-03-12 | Discharge: 2020-03-12 | Disposition: A | Discharge status: Home / Self Care | Payer: No Typology Code available for payment source | Source: Ambulatory Visit | Attending: Neurology | Admitting: Neurology

## 2020-03-12 DIAGNOSIS — I639 Cerebral infarction, unspecified: Secondary | ICD-10-CM

## 2020-03-12 DIAGNOSIS — R2689 Other abnormalities of gait and mobility: Secondary | ICD-10-CM

## 2020-05-18 HISTORY — PX: COLONOSCOPY WITH ESOPHAGOGASTRODUODENOSCOPY (EGD): SHX5779

## 2020-08-08 ENCOUNTER — Other Ambulatory Visit: Payer: Self-pay | Admitting: Physician Assistant

## 2020-08-08 DIAGNOSIS — Z1231 Encounter for screening mammogram for malignant neoplasm of breast: Secondary | ICD-10-CM

## 2020-08-28 HISTORY — PX: LAPAROSCOPIC GASTRIC BYPASS: SUR771

## 2020-08-29 HISTORY — PX: ESOPHAGOGASTRODUODENOSCOPY: SHX1529

## 2020-09-14 ENCOUNTER — Ambulatory Visit
Admission: RE | Admit: 2020-09-14 | Discharge: 2020-09-14 | Disposition: A | Discharge status: Home / Self Care | Payer: No Typology Code available for payment source | Source: Ambulatory Visit | Attending: Physician Assistant | Admitting: Physician Assistant

## 2020-09-14 ENCOUNTER — Other Ambulatory Visit: Payer: Self-pay

## 2020-09-14 DIAGNOSIS — Z1231 Encounter for screening mammogram for malignant neoplasm of breast: Secondary | ICD-10-CM | POA: Insufficient documentation

## 2020-12-19 ENCOUNTER — Telehealth: Payer: Self-pay | Admitting: *Deleted

## 2020-12-19 NOTE — Telephone Encounter (Signed)
Called cardiology and the secretary and asked her if we can get more information about why pt. Needs to come to hematology. The referral says that it is for anticoagulation monitoring. She has atrial fib and is on coumadin for that. Normally with atrial fib they are monitored by cardiology. I also see that pt has gastric bypass and we do get a lot  Of referrals because of malabsorption and patients made need IV iron. Staff left message to call us back with more info.

## 2020-12-21 ENCOUNTER — Telehealth: Payer: Self-pay

## 2020-12-21 NOTE — Telephone Encounter (Signed)
This pt was referred from [facility] cardiology... (See Judeen Hammans 's note).... the NP call back to explain the need to be seen... the issue is since she's been switched to coumadin they cannot get her into normal parameters, so they wanted her to be seen, to see if there is any underlying hematological issues.. Dr. Janese Banks will see pt on 12/22/20

## 2020-12-22 ENCOUNTER — Inpatient Hospital Stay: Payer: No Typology Code available for payment source | Attending: Oncology | Admitting: Oncology

## 2020-12-22 ENCOUNTER — Encounter: Payer: Self-pay | Admitting: Oncology

## 2020-12-22 VITALS — BP 96/73 | HR 60 | Temp 96.0°F | Resp 20 | Wt 218.8 lb

## 2020-12-22 DIAGNOSIS — Z79899 Other long term (current) drug therapy: Secondary | ICD-10-CM | POA: Diagnosis not present

## 2020-12-22 DIAGNOSIS — I4891 Unspecified atrial fibrillation: Secondary | ICD-10-CM | POA: Insufficient documentation

## 2020-12-22 DIAGNOSIS — G4733 Obstructive sleep apnea (adult) (pediatric): Secondary | ICD-10-CM | POA: Insufficient documentation

## 2020-12-22 DIAGNOSIS — Z803 Family history of malignant neoplasm of breast: Secondary | ICD-10-CM | POA: Diagnosis not present

## 2020-12-22 DIAGNOSIS — I509 Heart failure, unspecified: Secondary | ICD-10-CM | POA: Insufficient documentation

## 2020-12-22 DIAGNOSIS — I110 Hypertensive heart disease with heart failure: Secondary | ICD-10-CM | POA: Insufficient documentation

## 2020-12-22 DIAGNOSIS — Z833 Family history of diabetes mellitus: Secondary | ICD-10-CM | POA: Insufficient documentation

## 2020-12-22 DIAGNOSIS — Z8249 Family history of ischemic heart disease and other diseases of the circulatory system: Secondary | ICD-10-CM | POA: Diagnosis not present

## 2020-12-22 DIAGNOSIS — Z7901 Long term (current) use of anticoagulants: Secondary | ICD-10-CM | POA: Insufficient documentation

## 2020-12-22 DIAGNOSIS — Z8673 Personal history of transient ischemic attack (TIA), and cerebral infarction without residual deficits: Secondary | ICD-10-CM | POA: Insufficient documentation

## 2020-12-22 DIAGNOSIS — Z9884 Bariatric surgery status: Secondary | ICD-10-CM | POA: Diagnosis not present

## 2020-12-22 DIAGNOSIS — Z5181 Encounter for therapeutic drug level monitoring: Secondary | ICD-10-CM | POA: Insufficient documentation

## 2020-12-22 DIAGNOSIS — D72829 Elevated white blood cell count, unspecified: Secondary | ICD-10-CM | POA: Insufficient documentation
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
## 2021-01-31 ENCOUNTER — Ambulatory Visit: Payer: No Typology Code available for payment source | Attending: Obstetrics and Gynecology

## 2021-01-31 DIAGNOSIS — M533 Sacrococcygeal disorders, not elsewhere classified: Secondary | ICD-10-CM | POA: Diagnosis not present

## 2021-01-31 DIAGNOSIS — M62838 Other muscle spasm: Secondary | ICD-10-CM | POA: Insufficient documentation

## 2021-01-31 DIAGNOSIS — R293 Abnormal posture: Secondary | ICD-10-CM | POA: Diagnosis present
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
## 2021-02-01 ENCOUNTER — Other Ambulatory Visit: Payer: Self-pay | Admitting: Family Medicine

## 2021-02-01 DIAGNOSIS — R1906 Epigastric swelling, mass or lump: Secondary | ICD-10-CM

## 2021-02-06 ENCOUNTER — Ambulatory Visit: Payer: No Typology Code available for payment source

## 2021-02-07 ENCOUNTER — Other Ambulatory Visit: Payer: Self-pay

## 2021-02-07 ENCOUNTER — Other Ambulatory Visit: Payer: Self-pay | Admitting: Family Medicine

## 2021-02-07 ENCOUNTER — Ambulatory Visit: Payer: No Typology Code available for payment source | Attending: Obstetrics and Gynecology

## 2021-02-07 DIAGNOSIS — R293 Abnormal posture: Secondary | ICD-10-CM | POA: Insufficient documentation

## 2021-02-07 DIAGNOSIS — M62838 Other muscle spasm: Secondary | ICD-10-CM | POA: Insufficient documentation

## 2021-02-07 DIAGNOSIS — M533 Sacrococcygeal disorders, not elsewhere classified: Secondary | ICD-10-CM | POA: Insufficient documentation

## 2021-02-07 DIAGNOSIS — R1906 Epigastric swelling, mass or lump: Secondary | ICD-10-CM
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
## 2021-02-07 NOTE — Patient Instructions (Signed)
Pelvic Rotation: Contract / Relax (Supine)  MET to Correct Right Anteriorly Rotated/Left Posteriorly Rotated Innominate   Begin laying on your back with your feet at 90 degrees. Put a dowel/broomstick  through your legs, behind your right knee and in front of your left knee. Stabilize the dowel on ether side with your hands.  Press down with the right leg and up with the left leg. Hold for 5 seconds  then slowly relax. Repeat 5 times.    Hold for 30 seconds (5 deep breaths) and repeat 2-3 times on each side once a day  Flexors, Lunge  Hip Flexor Stretch: Proposal Pose    Maintain pelvic tuck under, lift pubic bone toward navel. Engage posterior hip muscles (firm glute muscles of leg in back position) and shift forward until you feel stretch on front of leg that is down. To increase stretch, maintain balance and ease hips forward. You may use one hand on a chair for balance if needed. Hold for __5__ breaths. Repeat __2-3__ times each leg.  Do _1-2__ times per day.

## 2021-02-08 ENCOUNTER — Ambulatory Visit
Admission: RE | Admit: 2021-02-08 | Discharge: 2021-02-08 | Disposition: A | Discharge status: Home / Self Care | Payer: No Typology Code available for payment source | Source: Ambulatory Visit | Attending: Family Medicine | Admitting: Family Medicine

## 2021-02-08 DIAGNOSIS — R1906 Epigastric swelling, mass or lump: Secondary | ICD-10-CM | POA: Diagnosis present

## 2021-02-14 ENCOUNTER — Other Ambulatory Visit (HOSPITAL_COMMUNITY): Payer: Self-pay | Admitting: Physician Assistant

## 2021-02-14 ENCOUNTER — Ambulatory Visit: Payer: No Typology Code available for payment source

## 2021-02-14 ENCOUNTER — Other Ambulatory Visit: Payer: Self-pay

## 2021-02-14 ENCOUNTER — Other Ambulatory Visit: Payer: Self-pay | Admitting: Physician Assistant

## 2021-02-14 DIAGNOSIS — M62838 Other muscle spasm: Secondary | ICD-10-CM

## 2021-02-14 DIAGNOSIS — R1013 Epigastric pain: Secondary | ICD-10-CM

## 2021-02-14 DIAGNOSIS — R1906 Epigastric swelling, mass or lump: Secondary | ICD-10-CM

## 2021-02-14 DIAGNOSIS — M533 Sacrococcygeal disorders, not elsewhere classified: Secondary | ICD-10-CM

## 2021-02-14 DIAGNOSIS — R293 Abnormal posture: Secondary | ICD-10-CM
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
## 2021-02-14 NOTE — Patient Instructions (Signed)

## 2021-02-17 ENCOUNTER — Ambulatory Visit: Payer: No Typology Code available for payment source

## 2021-02-28 ENCOUNTER — Ambulatory Visit: Payer: No Typology Code available for payment source | Admitting: Physical Therapy

## 2021-02-28 ENCOUNTER — Other Ambulatory Visit: Payer: Self-pay

## 2021-02-28 DIAGNOSIS — M533 Sacrococcygeal disorders, not elsewhere classified: Secondary | ICD-10-CM

## 2021-02-28 DIAGNOSIS — M62838 Other muscle spasm: Secondary | ICD-10-CM

## 2021-02-28 DIAGNOSIS — R293 Abnormal posture: Secondary | ICD-10-CM

## 2021-02-28 NOTE — Patient Instructions (Addendum)
Even up Shoe to level out pelvis while wearing boot   __    Clam Shell 45 Degrees  Lying with hips and knees bent 45, one pillow between knees and ankles. Heel together, toes apart like ballerina,  Lift knee with exhale while pressing heels together. Be sure pelvis does not roll backward. Do not arch back. Do 20 times, each leg, 2 times per day.     Complimentary stretch: Figure-4 in seated  Cross _ foot over _ thigh, opposite knee straight  3-5 breaths   ___  Deep core level 1 and 2 ( handout)   ___  Avoid straining pelvic floor, abdominal muscles , spine or worsen prolapse  Use log rolling technique instead of getting out of bed with your neck or the sit-up     Log rolling into and out of bed   Log rolling into and out of bed If getting out of bed on R side, Bent knees, scoot hips/ shoulder to L  Raise R arm completely overhead, rolling onto armpit  Then lower bent knees to bed to get into complete side lying position  Then drop legs off bed, and push up onto R elbow/forearm, and use L hand to push onto the bed  ___
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
## 2021-03-01 ENCOUNTER — Ambulatory Visit
Admission: RE | Admit: 2021-03-01 | Discharge: 2021-03-01 | Disposition: A | Discharge status: Home / Self Care | Payer: No Typology Code available for payment source | Source: Ambulatory Visit | Attending: Physician Assistant | Admitting: Physician Assistant

## 2021-03-01 ENCOUNTER — Other Ambulatory Visit: Payer: Self-pay

## 2021-03-01 DIAGNOSIS — R1906 Epigastric swelling, mass or lump: Secondary | ICD-10-CM | POA: Insufficient documentation

## 2021-03-01 DIAGNOSIS — R1013 Epigastric pain: Secondary | ICD-10-CM | POA: Insufficient documentation

## 2021-03-01 MED ORDER — IOHEXOL 300 MG/ML  SOLN
100.0000 mL | Freq: Once | INTRAMUSCULAR | Status: AC | PRN
  Administered 2021-03-01: 100 mL via INTRAVENOUS

## 2021-03-07 ENCOUNTER — Other Ambulatory Visit: Payer: Self-pay

## 2021-03-07 ENCOUNTER — Ambulatory Visit: Payer: No Typology Code available for payment source | Admitting: Physical Therapy

## 2021-03-07 DIAGNOSIS — M62838 Other muscle spasm: Secondary | ICD-10-CM

## 2021-03-07 DIAGNOSIS — M533 Sacrococcygeal disorders, not elsewhere classified: Secondary | ICD-10-CM | POA: Diagnosis not present

## 2021-03-07 DIAGNOSIS — R293 Abnormal posture: Secondary | ICD-10-CM
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
## 2021-03-07 NOTE — Patient Instructions (Addendum)
   On belly: Riding horse edge of mattress  knee bent like riding a horse, move knee towards armpit and out  10 reps   Mermaid stretch  Rocking while seated on the floor with heels to one side of the hip Heels to one side of the hip  Rock forward towards the knee that is bent , rock beck towards the opposite sitting bones 10 reps    On your back:   knee circles by armpit   10 reps    Buttterfly pose for relaxation  Knees supported   ___  PELVIC FLOOR / KEGEL EXERCISES   Pelvic floor/ Kegel exercises are used to strengthen the muscles in the base of your pelvis that are responsible for supporting your pelvic organs and preventing urine/feces leakage. Based on your therapist's recommendations, they can be performed while standing, sitting, or lying down. Imagine pelvic floor area as a diamond with pelvic landmarks: top =pubic bone, bottom tip=tailbone, sides=sitting bones (ischial tuberosities).    Make yourself aware of this muscle group by using these cues while coordinating your breath:  Inhale, feel pelvic floor diamond area lower like hammock towards your feet and ribcage/belly expanding. Pause. Let the exhale naturally and feel your belly sink, abdominal muscles hugging in around you and you may notice the pelvic diamond draws upward towards your head forming a umbrella shape. Give a squeeze during the exhalation like you are stopping the flow of urine. If you are squeezing the buttock muscles, try to give 50% less effort.   Common Errors:  Breath holding: If you are holding your breath, you may be bearing down against your bladder instead of pulling it up. If you belly bulges up while you are squeezing, you are holding your breath. Be sure to breathe gently in and out while exercising. Counting out loud may help you avoid holding your breath.  Accessory muscle use: You should not see or feel other muscle movement when performing pelvic floor exercises. When done properly,  no one can tell that you are performing the exercises. Keep the buttocks, belly and inner thighs relaxed.  Overdoing it: Your muscles can fatigue and stop working for you if you over-exercise. You may actually leak more or feel soreness at the lower abdomen or rectum.  YOUR HOME EXERCISE PROGRAM    SHORT HOLDS: Position: on back with pillow under hips, and pillow under knees with knee s straight to avoid heel pressure   Inhale and then exhale. Then squeeze the muscle.  (Be sure to let belly sink in with exhales and not push outward)  Perform 5 repetitions, 4 different times a day       __  Do stretches   Deep core level 1 ( 10 breaths)   5 pelvic floor quick squeeze with breathing   Deep core level 2 ( 6 min)         5 pelvic floor quick squeeze with breathing   Another set of this routine at night

## 2021-03-13 ENCOUNTER — Ambulatory Visit: Payer: No Typology Code available for payment source | Admitting: Physical Therapy

## 2021-03-14 ENCOUNTER — Encounter: Payer: No Typology Code available for payment source | Admitting: Physical Therapy

## 2021-03-31 ENCOUNTER — Ambulatory Visit
Payer: No Typology Code available for payment source | Attending: Obstetrics and Gynecology | Admitting: Physical Therapy

## 2021-03-31 ENCOUNTER — Other Ambulatory Visit: Payer: Self-pay

## 2021-03-31 DIAGNOSIS — R293 Abnormal posture: Secondary | ICD-10-CM | POA: Insufficient documentation

## 2021-03-31 DIAGNOSIS — M533 Sacrococcygeal disorders, not elsewhere classified: Secondary | ICD-10-CM | POA: Diagnosis not present

## 2021-03-31 DIAGNOSIS — M62838 Other muscle spasm: Secondary | ICD-10-CM | POA: Diagnosis present
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
## 2021-04-03 ENCOUNTER — Other Ambulatory Visit: Payer: Self-pay

## 2021-04-03 DIAGNOSIS — R233 Spontaneous ecchymoses: Secondary | ICD-10-CM | POA: Insufficient documentation

## 2021-04-03 DIAGNOSIS — Z7901 Long term (current) use of anticoagulants: Secondary | ICD-10-CM | POA: Insufficient documentation

## 2021-04-03 DIAGNOSIS — I509 Heart failure, unspecified: Secondary | ICD-10-CM | POA: Insufficient documentation

## 2021-04-03 DIAGNOSIS — R1084 Generalized abdominal pain: Secondary | ICD-10-CM | POA: Diagnosis not present

## 2021-04-03 DIAGNOSIS — R002 Palpitations: Secondary | ICD-10-CM | POA: Insufficient documentation

## 2021-04-03 DIAGNOSIS — I110 Hypertensive heart disease with heart failure: Secondary | ICD-10-CM | POA: Insufficient documentation

## 2021-04-03 DIAGNOSIS — Z79899 Other long term (current) drug therapy: Secondary | ICD-10-CM | POA: Insufficient documentation

## 2021-04-03 DIAGNOSIS — Z91040 Latex allergy status: Secondary | ICD-10-CM | POA: Diagnosis not present

## 2021-04-03 DIAGNOSIS — R109 Unspecified abdominal pain: Secondary | ICD-10-CM | POA: Diagnosis present

## 2021-04-03 LAB — CBC
HCT: 40.5 % (ref 36.0–46.0)
Hemoglobin: 13.5 g/dL (ref 12.0–15.0)
MCH: 29.3 pg (ref 26.0–34.0)
MCHC: 33.3 g/dL (ref 30.0–36.0)
MCV: 87.9 fL (ref 80.0–100.0)
Platelets: 284 10*3/uL (ref 150–400)
RBC: 4.61 MIL/uL (ref 3.87–5.11)
RDW: 14 % (ref 11.5–15.5)
WBC: 8.9 10*3/uL (ref 4.0–10.5)
nRBC: 0 % (ref 0.0–0.2)

## 2021-04-03 LAB — URINALYSIS, COMPLETE (UACMP) WITH MICROSCOPIC
Bacteria, UA: NONE SEEN
Bilirubin Urine: NEGATIVE
Glucose, UA: NEGATIVE mg/dL
Hgb urine dipstick: NEGATIVE
Ketones, ur: 20 mg/dL — AB
Leukocytes,Ua: NEGATIVE
Nitrite: NEGATIVE
Protein, ur: NEGATIVE mg/dL
Specific Gravity, Urine: 1.025 (ref 1.005–1.030)
Squamous Epithelial / HPF: NONE SEEN (ref 0–5)
pH: 5 (ref 5.0–8.0)

## 2021-04-03 LAB — COMPREHENSIVE METABOLIC PANEL
ALT: 40 U/L (ref 0–44)
AST: 30 U/L (ref 15–41)
Albumin: 4.1 g/dL (ref 3.5–5.0)
Alkaline Phosphatase: 92 U/L (ref 38–126)
Anion gap: 6 (ref 5–15)
BUN: 14 mg/dL (ref 6–20)
CO2: 28 mmol/L (ref 22–32)
Calcium: 9 mg/dL (ref 8.9–10.3)
Chloride: 106 mmol/L (ref 98–111)
Creatinine, Ser: 0.53 mg/dL (ref 0.44–1.00)
GFR, Estimated: >60 mL/min (ref >60)
Glucose, Bld: 103 mg/dL — ABNORMAL HIGH (ref 70–99)
Potassium: 3.9 mmol/L (ref 3.5–5.1)
Sodium: 140 mmol/L (ref 135–145)
Total Bilirubin: 0.8 mg/dL (ref 0.3–1.2)
Total Protein: 7.3 g/dL (ref 6.5–8.1)

## 2021-04-03 LAB — PROTIME-INR
INR: 1.5 — ABNORMAL HIGH (ref 0.8–1.2)
Prothrombin Time: 18.1 seconds — ABNORMAL HIGH (ref 11.4–15.2)

## 2021-04-03 LAB — TROPONIN I (HIGH SENSITIVITY): Troponin I (High Sensitivity): 3 ng/L (ref <18)

## 2021-04-03 LAB — LIPASE, BLOOD: Lipase: 29 U/L (ref 11–51)

## 2021-04-03 NOTE — ED Triage Notes (Addendum)
Pt in with co abd bloating since yesterday, did have BM today. Pt states today started having vomiting x 2, heart racing and has noticed rash to legs. Pt is on coumadin for Afib.

## 2021-04-04 ENCOUNTER — Emergency Department: Payer: No Typology Code available for payment source

## 2021-04-04 ENCOUNTER — Emergency Department
Admission: EM | Admit: 2021-04-04 | Discharge: 2021-04-04 | Disposition: A | Discharge status: Home / Self Care | Payer: No Typology Code available for payment source | Attending: Emergency Medicine | Admitting: Emergency Medicine

## 2021-04-04 DIAGNOSIS — R1010 Upper abdominal pain, unspecified: Secondary | ICD-10-CM

## 2021-04-04 DIAGNOSIS — R233 Spontaneous ecchymoses: Secondary | ICD-10-CM

## 2021-04-04 DIAGNOSIS — R002 Palpitations: Secondary | ICD-10-CM

## 2021-04-04 LAB — TROPONIN I (HIGH SENSITIVITY): Troponin I (High Sensitivity): 2 ng/L (ref <18)

## 2021-04-04 MED ORDER — IOHEXOL 300 MG/ML  SOLN
100.0000 mL | Freq: Once | INTRAMUSCULAR | Status: AC | PRN
  Administered 2021-04-04: 100 mL via INTRAVENOUS

## 2021-04-04 MED ORDER — ONDANSETRON 4 MG PO TBDP: 4.0000 mg | ORAL_TABLET | Freq: Four times a day (QID) | ORAL | 0 refills | Status: DC | PRN
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
## 2021-04-05 ENCOUNTER — Encounter: Payer: Self-pay | Admitting: Dermatology

## 2021-04-05 ENCOUNTER — Other Ambulatory Visit: Payer: Self-pay

## 2021-04-05 ENCOUNTER — Ambulatory Visit (INDEPENDENT_AMBULATORY_CARE_PROVIDER_SITE_OTHER): Payer: No Typology Code available for payment source | Admitting: Dermatology

## 2021-04-05 DIAGNOSIS — R233 Spontaneous ecchymoses: Secondary | ICD-10-CM | POA: Diagnosis not present

## 2021-04-05 DIAGNOSIS — R109 Unspecified abdominal pain: Secondary | ICD-10-CM | POA: Diagnosis not present

## 2021-04-05 MED ORDER — MUPIROCIN 2 % EX OINT: 1.0000 "application " | TOPICAL_OINTMENT | Freq: Two times a day (BID) | CUTANEOUS | 0 refills | Status: DC

## 2021-04-05 NOTE — Patient Instructions (Signed)
Wound Care Instructions  Cleanse wound gently with soap and water once a day then pat dry with clean gauze. Apply a thing coat of Petrolatum (petroleum jelly, "Vaseline") over the wound (unless you have an allergy to this). We recommend that you use a new, sterile tube of Vaseline. Do not pick or remove scabs. Do not remove the yellow or white "healing tissue" from the base of the wound.  Cover the wound with fresh, clean, nonstick gauze and secure with paper tape. You may use Band-Aids in place of gauze and tape if the would is small enough, but would recommend trimming much of the tape off as there is often too much. Sometimes Band-Aids can irritate the skin.  You should call the office for your biopsy report after 1 week if you have not already been contacted.  If you experience any problems, such as abnormal amounts of bleeding, swelling, significant bruising, significant pain, or evidence of infection, please call the office immediately.  FOR ADULT SURGERY PATIENTS: If you need something for pain relief you may take 1 extra strength Tylenol (acetaminophen) AND 2 Ibuprofen (200mg each) together every 4 hours as needed for pain. (do not take these if you are allergic to them or if you have a reason you should not take them.) Typically, you may only need pain medication for 1 to 3 days.   If you have any questions or concerns for your doctor, please call our main line at [phone] and press option 4 to reach your doctor's medical assistant. If no one answers, please leave a voicemail as directed and we will return your call as soon as possible. Messages left after 4 pm will be answered the following business day.   You may also send us a message via MyChart. We typically respond to MyChart messages within 1-2 business days.  For prescription refills, please ask your pharmacy to contact our office. Our fax number is [phone].  If you have an urgent issue when the clinic is closed that  cannot wait until the next business day, you can page your doctor at the number below.    Please note that while we do our best to be available for urgent issues outside of office hours, we are not available 24/7.   If you have an urgent issue and are unable to reach us, you may choose to seek medical care at your doctor's office, retail clinic, urgent care center, or emergency room.  If you have a medical emergency, please immediately call 911 or go to the emergency department.  Pager Numbers  - Dr. Kowalski: [phone]  - Dr. Moye: [phone]  - Dr. Stewart: [phone]  In the event of inclement weather, please call our main line at [phone] for an update on the status of any delays or closures.  Dermatology Medication Tips: Please keep the boxes that topical medications come in in order to help keep track of the instructions about where and how to use these. Pharmacies typically print the medication instructions only on the boxes and not directly on the medication tubes.   If your medication is too expensive, please contact our office at [phone] option 4 or send us a message through MyChart.   We are unable to tell what your co-pay for medications will be in advance as this is different depending on your insurance coverage. However, we may be able to find a substitute medication at lower cost or fill out paperwork to get insurance to cover a needed   medication.   If a prior authorization is required to get your medication covered by your insurance company, please allow us 1-2 business days to complete this process.  Drug prices often vary depending on where the prescription is filled and some pharmacies may offer cheaper prices.  The website www.goodrx.com contains coupons for medications through different pharmacies. The prices here do not account for what the cost may be with help from insurance (it may be cheaper with your insurance), but the website can give you the  price if you did not use any insurance.  - You can print the associated coupon and take it with your prescription to the pharmacy.  - You may also stop by our office during regular business hours and pick up a GoodRx coupon card.  - If you need your prescription sent electronically to a different pharmacy, notify our office through [place] MyChart or by phone at [phone] option 4.   

## 2021-04-05 NOTE — Progress Notes (Signed)
New Patient Visit  Subjective  Michelle Norris is a 50 y.o. female who presents for the following: New Patient (Initial Visit) (Patient here today concerns some dark blood spots under skin. Patient states that this noticed this past Monday at home. Has been to ER concerning spots and abdominal problems. Patient has had scan to check for abdominal bleeding. They were unable to find internal bleeding.  She states ER told her that it was petechiae. Patient reports she has recently been bit by tick. 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 Patient states she uses bug spray. ).   The following portions of the chart were reviewed this encounter and updated as appropriate:   Tobacco  Allergies  Meds  Problems  Med Hx  Surg Hx  Fam Hx       Review of Systems:  No other skin or systemic complaints except as noted in HPI or Assessment and Plan.  Objective  Well appearing patient in no apparent distress; mood and affect are within normal limits.  A focused examination was performed including legs, arms, face. Relevant physical exam findings are noted in the Assessment and Plan.  Right Thigh - Anterior, left anterior thigh Scattered petechiae, palpable, non-blanching          Assessment & Plan  Petechiae (2) Right Thigh - Anterior; left anterior thigh  R/o vasculitis including IgA vasculitis   Recommend 2 samples today to check H&E and DIF  No thrombocytopenia  Skin / nail biopsy - left anterior thigh Type of biopsy: punch   Informed consent: discussed and consent obtained   Timeout: patient name, date of birth, surgical site, and procedure verified   Procedure prep:  Patient was prepped and draped in usual sterile fashion Prep type:  Chlorhexidine Anesthesia: the lesion was anesthetized in a standard fashion   Anesthetic:  1% lidocaine w/ epinephrine 1-100,000 buffered w/ 8.4% NaHCO3 Punch size:  3 mm Suture size:  4-0 Suture type: Prolene (polypropylene)    Suture removal (days):  14 Hemostasis achieved with: suture   Outcome: patient tolerated procedure well   Post-procedure details: wound care instructions given   Additional details:  Mupirocin and a pressure dressing were applied Specimen placed in michel's medium for DIF  mupirocin ointment (BACTROBAN) 2 % - Right Thigh - Anterior, left anterior thigh Apply 1 application topically 2 (two) times daily. To affected areas and cover with bandaide until healed.  Skin / nail biopsy - Right Thigh - Anterior Type of biopsy: punch   Informed consent: discussed and consent obtained   Timeout: patient name, date of birth, surgical site, and procedure verified   Patient was prepped and draped in usual sterile fashion: Area prepped with chlorhexidine. Anesthesia: the lesion was anesthetized in a standard fashion   Anesthetic:  1% lidocaine w/ epinephrine 1-100,000 buffered w/ 8.4% NaHCO3 Suture type: nylon   Suture removal (days):  14 Hemostasis achieved with: suture, pressure and aluminum chloride   Outcome: patient tolerated procedure well   Post-procedure details: wound care instructions given   Post-procedure details comment:  Ointment and small bandage applied Additional details:  Mupirocin and a dressing applied  Specimen 1 - Surgical pathology Differential Diagnosis: r/o IgA vasculitis on DIF  Check Margins: No.  Petechiae   Specimen 2 - Surgical pathology Differential Diagnosis: R/o vasculitis   Check Margins: No  Petechiae   Abdominal pain, unspecified abdominal location Right Antecubital Fossa  Reviewed medical records from ED Reviewed lab results (CMP, urinalysis, CBC with  diff, CT abdomen). No protein or blood in urine. No thickening of bowel wall.  Differential diagnosis includes HSP/IgA vasculitis, though CT abdomen, CMP, urinalysis are not suggestive. Will await biopsy results.  Return for 2 week suture removal . I, Ruthell Rummage, CMA, am acting as scribe for  Forest Gleason, MD.  Documentation: I have reviewed the above documentation for accuracy and completeness, and I agree with the above.  Forest Gleason, MD

## 2021-04-06 ENCOUNTER — Ambulatory Visit: Payer: No Typology Code available for payment source | Admitting: Physical Therapy

## 2021-04-06 DIAGNOSIS — M533 Sacrococcygeal disorders, not elsewhere classified: Secondary | ICD-10-CM

## 2021-04-06 DIAGNOSIS — M62838 Other muscle spasm: Secondary | ICD-10-CM

## 2021-04-06 DIAGNOSIS — R293 Abnormal posture: Secondary | ICD-10-CM
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
## 2021-04-06 NOTE — Patient Instructions (Signed)
Diaphragm pelvic floor breathing in prone for resistance ( pillow under belly)   Sandal with heel strap instead of flipflops

## 2021-04-11 ENCOUNTER — Telehealth: Payer: Self-pay

## 2021-04-11 NOTE — Telephone Encounter (Signed)
-----   Message from Alfonso Patten, MD sent at 04/11/2021 12:21 PM EDT ----- 1. Direct Immunofluorescence, left anterior thigh NEGATIVE FOR IMMUNOREACTANTS 2. Skin , right thigh-anterior PERIVASCULAR DERMATITIS WITH EXTRAVASATED ERYTHROCYTES, SEE DESCRIPTION Microscopic Description 1. The specimen is negative for immunoreactants which include antisera against IgG, IgM, IgA, C3 component of complement and fibrinogen. The controls stained appropriately. 2. There is a sparse superficial perivascular lymphocytic infiltrate with scattered extravasated erythrocytes. The epidermis is intact. The findings are not completely diagnostic but may be seen in some viral exanthems or morbilliform drug eruptions. In some instances, drugs that alter platelet function can accentuate the erythrocyte extravasation. Following review of the hematoxylin and eosin sections, a PAS stain was obtained to exclude a fungal infection. The PAS stain is negative for fungal organisms. Multiple levels taken through the submitted block are examined.  "Inflammation around the blood vessels but the blood vessels themselves are not inflamed. This can be seen with viral infections and sometimes drug reactions." No treatment or other testing for the rash is needed unless it becomes itchy or bothersome for her.  MAs please call. Thank you!

## 2021-04-11 NOTE — Telephone Encounter (Signed)
Called and informed patient of biopsy results and providers comments. Patient verbalized understanding and denied further questions at this time.

## 2021-04-12 ENCOUNTER — Ambulatory Visit
Payer: No Typology Code available for payment source | Attending: Obstetrics and Gynecology | Admitting: Physical Therapy

## 2021-04-12 ENCOUNTER — Other Ambulatory Visit: Payer: Self-pay

## 2021-04-12 DIAGNOSIS — R293 Abnormal posture: Secondary | ICD-10-CM | POA: Diagnosis not present

## 2021-04-12 DIAGNOSIS — M533 Sacrococcygeal disorders, not elsewhere classified: Secondary | ICD-10-CM | POA: Insufficient documentation

## 2021-04-12 DIAGNOSIS — M62838 Other muscle spasm: Secondary | ICD-10-CM | POA: Insufficient documentation
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
## 2021-04-17 ENCOUNTER — Inpatient Hospital Stay: Payer: No Typology Code available for payment source

## 2021-04-17 ENCOUNTER — Encounter: Payer: Self-pay | Admitting: Oncology

## 2021-04-17 ENCOUNTER — Other Ambulatory Visit: Payer: Self-pay

## 2021-04-17 ENCOUNTER — Inpatient Hospital Stay: Payer: No Typology Code available for payment source | Attending: Oncology | Admitting: Oncology

## 2021-04-17 VITALS — BP 101/67 | HR 58 | Temp 96.4°F | Resp 20 | Wt 197.2 lb

## 2021-04-17 DIAGNOSIS — Z5181 Encounter for therapeutic drug level monitoring: Secondary | ICD-10-CM | POA: Insufficient documentation

## 2021-04-17 DIAGNOSIS — I4891 Unspecified atrial fibrillation: Secondary | ICD-10-CM | POA: Diagnosis not present

## 2021-04-17 DIAGNOSIS — R161 Splenomegaly, not elsewhere classified: Secondary | ICD-10-CM | POA: Diagnosis not present

## 2021-04-17 DIAGNOSIS — Z7901 Long term (current) use of anticoagulants: Secondary | ICD-10-CM | POA: Diagnosis present

## 2021-04-17 LAB — CBC WITH DIFFERENTIAL/PLATELET
Abs Immature Granulocytes: 0.02 10*3/uL (ref 0.00–0.07)
Basophils Absolute: 0 10*3/uL (ref 0.0–0.1)
Basophils Relative: 0 %
Eosinophils Absolute: 0.1 10*3/uL (ref 0.0–0.5)
Eosinophils Relative: 2 %
HCT: 40.7 % (ref 36.0–46.0)
Hemoglobin: 13.4 g/dL (ref 12.0–15.0)
Immature Granulocytes: 0 %
Lymphocytes Relative: 33 %
Lymphs Abs: 1.9 10*3/uL (ref 0.7–4.0)
MCH: 29.2 pg (ref 26.0–34.0)
MCHC: 32.9 g/dL (ref 30.0–36.0)
MCV: 88.7 fL (ref 80.0–100.0)
Monocytes Absolute: 0.4 10*3/uL (ref 0.1–1.0)
Monocytes Relative: 6 %
Neutro Abs: 3.4 10*3/uL (ref 1.7–7.7)
Neutrophils Relative %: 59 %
Platelets: 248 10*3/uL (ref 150–400)
RBC: 4.59 MIL/uL (ref 3.87–5.11)
RDW: 13.9 % (ref 11.5–15.5)
WBC: 5.8 10*3/uL (ref 4.0–10.5)
nRBC: 0 % (ref 0.0–0.2)

## 2021-04-17 LAB — FIBRINOGEN: Fibrinogen: 371 mg/dL (ref 210–475)

## 2021-04-17 LAB — PROTIME-INR
INR: 1.6 — ABNORMAL HIGH (ref 0.8–1.2)
Prothrombin Time: 19.2 seconds — ABNORMAL HIGH (ref 11.4–15.2)

## 2021-04-17 LAB — LACTATE DEHYDROGENASE: LDH: 114 U/L (ref 98–192)

## 2021-04-17 LAB — APTT: aPTT: 34 seconds (ref 24–36)

## 2021-04-19 ENCOUNTER — Ambulatory Visit: Payer: No Typology Code available for payment source | Admitting: Physical Therapy

## 2021-04-19 ENCOUNTER — Other Ambulatory Visit: Payer: Self-pay

## 2021-04-19 ENCOUNTER — Ambulatory Visit (INDEPENDENT_AMBULATORY_CARE_PROVIDER_SITE_OTHER): Payer: No Typology Code available for payment source

## 2021-04-19 DIAGNOSIS — Z4802 Encounter for removal of sutures: Secondary | ICD-10-CM

## 2021-04-19 DIAGNOSIS — M62838 Other muscle spasm: Secondary | ICD-10-CM

## 2021-04-19 DIAGNOSIS — R293 Abnormal posture: Secondary | ICD-10-CM | POA: Diagnosis not present

## 2021-04-19 DIAGNOSIS — M533 Sacrococcygeal disorders, not elsewhere classified: Secondary | ICD-10-CM

## 2021-04-19 NOTE — Patient Instructions (Signed)
Pillow under hips with deep core level 1-2
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
## 2021-04-19 NOTE — Progress Notes (Signed)
   Follow-Up Visit   Subjective  Michelle Norris is a 50 y.o. female who presents for the following: Suture / Staple Removal (Pt here for two week suture removal on right and left anterior thighs. Results already discussed with pt over the phone. ).   The following portions of the chart were reviewed this encounter and updated as appropriate:       Objective  Well appearing patient in no apparent distress; mood and affect are within normal limits.   Left Thigh - Anterior, Right Thigh - Anterior Incision site is clean, dry and intact    Assessment & Plan  Encounter for removal of sutures (2) Left Thigh - Anterior; Right Thigh - Anterior  Wound cleansed, sutures removed, wound cleansed and bandages applied.    No follow-ups on file.   I, Harriett Sine, CMA, am acting as scribe for [employer], CMA.

## 2021-04-26 ENCOUNTER — Other Ambulatory Visit: Payer: Self-pay

## 2021-04-26 ENCOUNTER — Ambulatory Visit: Payer: No Typology Code available for payment source | Admitting: Physical Therapy

## 2021-04-26 DIAGNOSIS — M533 Sacrococcygeal disorders, not elsewhere classified: Secondary | ICD-10-CM

## 2021-04-26 DIAGNOSIS — M62838 Other muscle spasm: Secondary | ICD-10-CM

## 2021-04-26 DIAGNOSIS — R293 Abnormal posture: Secondary | ICD-10-CM | POA: Diagnosis not present
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
## 2021-04-26 NOTE — Patient Instructions (Signed)
   Step ups   Step up L up, R up, L down, R down, exhale up and down, lower heel slow and controlled, shoulder slightly forward, center of mass moves after [place]mounds contacts  10 x 3 reps each   _________   Heel lift with hand wall  More pressure across pinky toe to avoid collapsed arch   _________  Deep core 1-2 , pay attention to reset with feet pressed down ,toes wide, ballmounds down
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
## 2021-05-03 ENCOUNTER — Ambulatory Visit: Payer: No Typology Code available for payment source | Admitting: Physical Therapy

## 2021-05-10 ENCOUNTER — Ambulatory Visit
Payer: No Typology Code available for payment source | Attending: Obstetrics and Gynecology | Admitting: Physical Therapy

## 2021-05-10 ENCOUNTER — Other Ambulatory Visit: Payer: Self-pay

## 2021-05-10 DIAGNOSIS — M62838 Other muscle spasm: Secondary | ICD-10-CM | POA: Diagnosis present

## 2021-05-10 DIAGNOSIS — R293 Abnormal posture: Secondary | ICD-10-CM | POA: Insufficient documentation

## 2021-05-10 DIAGNOSIS — M533 Sacrococcygeal disorders, not elsewhere classified: Secondary | ICD-10-CM | POA: Insufficient documentation
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
## 2021-05-10 NOTE — Patient Instructions (Signed)
   WALKING WITH RESISTANCE BLUE Band at waist connected to doorknob 2mins Stepping forward normal length steps, planting mid and forefoot down, center of mass ( navel) leans forward slightly as if you were walking uphill 3-4 steps till band feels taut ( MAKE SURE THE DOOR IS LOCKED AND WON'T OPEN)   Stepping backwards, lower heel slowly, carry trunk and hips back , leaning forward, front knee along 2-3 rd toe line    2 min  ____    

## 2021-05-18 ENCOUNTER — Other Ambulatory Visit: Payer: Self-pay

## 2021-05-18 ENCOUNTER — Ambulatory Visit: Payer: No Typology Code available for payment source | Admitting: Physical Therapy

## 2021-05-18 DIAGNOSIS — M533 Sacrococcygeal disorders, not elsewhere classified: Secondary | ICD-10-CM

## 2021-05-18 DIAGNOSIS — R293 Abnormal posture: Secondary | ICD-10-CM

## 2021-05-18 DIAGNOSIS — M62838 Other muscle spasm: Secondary | ICD-10-CM

## 2021-05-18 NOTE — Patient Instructions (Signed)
Modified   pillow under belly,  Exhale :  L arm up  at a "V" thumbs up, shoulder down , chin tuck  Lengthen whole spine as if yard stick is balanced on spine, chin tucked   R leg lifts 15 deg up   L + R = 1 rep 10 reps   X 2   __
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
## 2021-05-24 ENCOUNTER — Ambulatory Visit: Payer: No Typology Code available for payment source | Admitting: Physical Therapy

## 2021-05-29 ENCOUNTER — Ambulatory Visit: Payer: No Typology Code available for payment source | Admitting: Physical Therapy

## 2021-06-05 ENCOUNTER — Ambulatory Visit: Payer: No Typology Code available for payment source | Admitting: Physical Therapy

## 2021-06-05 ENCOUNTER — Other Ambulatory Visit: Payer: Self-pay

## 2021-06-05 DIAGNOSIS — R293 Abnormal posture: Secondary | ICD-10-CM

## 2021-06-05 DIAGNOSIS — M62838 Other muscle spasm: Secondary | ICD-10-CM

## 2021-06-05 DIAGNOSIS — M533 Sacrococcygeal disorders, not elsewhere classified: Secondary | ICD-10-CM
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
## 2021-06-30 ENCOUNTER — Other Ambulatory Visit: Payer: Self-pay

## 2021-06-30 ENCOUNTER — Ambulatory Visit
Payer: No Typology Code available for payment source | Attending: Obstetrics and Gynecology | Admitting: Physical Therapy

## 2021-06-30 DIAGNOSIS — R293 Abnormal posture: Secondary | ICD-10-CM | POA: Insufficient documentation

## 2021-06-30 DIAGNOSIS — M62838 Other muscle spasm: Secondary | ICD-10-CM | POA: Insufficient documentation

## 2021-06-30 DIAGNOSIS — M533 Sacrococcygeal disorders, not elsewhere classified: Secondary | ICD-10-CM | POA: Insufficient documentation

## 2021-06-30 NOTE — Patient Instructions (Addendum)
Stronger arches:   Dolphin plank / mini squat heel lifted  20 reps    PLEATS:    Heel raises -heels  together, minisquat position, look at floor, buttocks back  Hands at a corner of a wall   Knees bent pointed out like a "v" , navel ( center of mass) more forward  Heels together as you lift, pointed out like a "v"  KNEES ARE ALIGNED BEHIND THE TOES TO MINIMIZE STRAIN ON THE KNEES Lower heels down slow with your  navel ( center of mass) more forward to avoid dropping down fast and rocking more weight back onto heels   10 reps  ________Calf stretch    Modified    pillow under belly,   Exhale :   L arm up  at a "V" thumbs up, shoulder down , chin tuck   Lengthen whole spine as if yard stick is balanced on spine, chin tucked    R leg lifts 15 deg up    L + R = 1 rep 10 reps    X 2     __  Multifidis twist  Band is on doorknob: stand further away from door (facing perpendicular)   Twisting trunk without moving the hips and knees Hold band at the level of ribcage, elbows bent,shoulder blades roll back and down like squeezing a pencil under armpit    Exhale twist,.10-15 deg away from door without moving your hips/ knees. Continue to maintain equal weight through legs. Keep knee unlocked.  30 reps
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
## 2021-07-06 ENCOUNTER — Ambulatory Visit: Payer: No Typology Code available for payment source | Admitting: Physical Therapy

## 2021-07-06 ENCOUNTER — Other Ambulatory Visit: Payer: Self-pay

## 2021-07-06 DIAGNOSIS — R293 Abnormal posture: Secondary | ICD-10-CM | POA: Diagnosis present

## 2021-07-06 DIAGNOSIS — M533 Sacrococcygeal disorders, not elsewhere classified: Secondary | ICD-10-CM

## 2021-07-06 DIAGNOSIS — M62838 Other muscle spasm: Secondary | ICD-10-CM | POA: Diagnosis present
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
## 2021-07-07 ENCOUNTER — Ambulatory Visit: Payer: No Typology Code available for payment source | Admitting: Physical Therapy

## 2021-07-14 ENCOUNTER — Ambulatory Visit
Payer: No Typology Code available for payment source | Attending: Obstetrics and Gynecology | Admitting: Physical Therapy

## 2021-07-14 ENCOUNTER — Other Ambulatory Visit: Payer: Self-pay

## 2021-07-14 DIAGNOSIS — M62838 Other muscle spasm: Secondary | ICD-10-CM | POA: Insufficient documentation

## 2021-07-14 DIAGNOSIS — M533 Sacrococcygeal disorders, not elsewhere classified: Secondary | ICD-10-CM | POA: Diagnosis present

## 2021-07-14 DIAGNOSIS — R293 Abnormal posture: Secondary | ICD-10-CM | POA: Diagnosis not present
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
## 2021-07-14 NOTE — Patient Instructions (Signed)
Minisquat side step with red band   __  Minisquat: Scoot buttocks back slight, hinge like you are looking at your reflection on a pond  Knees behind toes,  Inhale to "smell flowers"  Exhale on the rise "like rocket"  Do not lock knees, have more weight across ballmounds of feet, toes relaxed   THEN HALF step on both feet first lap with left foot leading down a hall way = 1 lap  Repeated with other foot leading =1 lap  2 laps each side

## 2021-07-20 ENCOUNTER — Other Ambulatory Visit: Payer: Self-pay

## 2021-07-20 ENCOUNTER — Ambulatory Visit: Payer: No Typology Code available for payment source | Admitting: Physical Therapy

## 2021-07-20 DIAGNOSIS — R293 Abnormal posture: Secondary | ICD-10-CM

## 2021-07-20 DIAGNOSIS — M62838 Other muscle spasm: Secondary | ICD-10-CM

## 2021-07-20 DIAGNOSIS — M533 Sacrococcygeal disorders, not elsewhere classified: Secondary | ICD-10-CM

## 2021-07-20 NOTE — Patient Instructions (Addendum)
Bridging series w/ resistive band other side of doorknob:  Level 1:  Position:  Elbows bent, knees hip width apart, heels under knees   Stabilization points: shoulders, upper arms, back of head pressed into floor. Heel press downward.   Movement: inhale do nothing, exhale pull band by side, lower fists to floor completely while lifting hips.Keep stabilization points engaged when you allow the band to go back to starting position  20 reps        Level 2:  Position:  Elbows straight, arms raised to ceiling at shoulder height, knees apart like a ballerina,heels together, heels under knees  Stabilization points: shoulders, upper arms, back of head pressed into floor. Heel press downward.   Movement: inhale do nothing, exhale pull band by side, lower fists to floor completely while lifting hips. Keep stabilization points engaged when you allow the band to go back to starting position   20 reps   Shoulder training: Try to imagine you are squeezing a pencil under your armpit and your shoulder blades are down away from your ears and towards each other    __  Transition from standing to floor :  stand to floor transfer :      _ slow     _ mini squat      _ crawl down with one hand on thigh      _downward dog  - >  shoulders down and back-  walk the dog ( knee bents to lengthe hamstrings)      Floor to stand :   downward dog   crawl hands back, butt is back, knees behind toes -> squat  Hands at waist , elbows back, chest lifts
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
## 2021-07-27 ENCOUNTER — Ambulatory Visit: Payer: No Typology Code available for payment source | Admitting: Physical Therapy

## 2021-07-27 ENCOUNTER — Other Ambulatory Visit: Payer: Self-pay

## 2021-07-27 DIAGNOSIS — R293 Abnormal posture: Secondary | ICD-10-CM | POA: Diagnosis not present

## 2021-07-27 DIAGNOSIS — M62838 Other muscle spasm: Secondary | ICD-10-CM

## 2021-07-27 DIAGNOSIS — M533 Sacrococcygeal disorders, not elsewhere classified: Secondary | ICD-10-CM
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
## 2021-07-27 NOTE — Patient Instructions (Signed)
R leg strengthening for 3-4 weeks:  Blue band Stirrups lowering (Hip flexor)  30 reps  __  Band at leg of chair  Straighten knee ( quad strengthening , point toe slightly out)  30 reps   __  Sit in chair opposite of chair withband  Knee bents, move ankle under chair  (Hamstrings)   30 reps  ___  Standing over arms of chair Slight kick back opp knee aslightly bend  Elbows by ribs, shoulders down   __  Swimmers   __  Ardine Eng pose to lengthen back   quick 5 reps squeezes

## 2021-08-02 ENCOUNTER — Encounter: Payer: Self-pay | Admitting: Cardiology

## 2021-08-02 ENCOUNTER — Other Ambulatory Visit: Payer: Self-pay

## 2021-08-02 ENCOUNTER — Ambulatory Visit (INDEPENDENT_AMBULATORY_CARE_PROVIDER_SITE_OTHER): Payer: No Typology Code available for payment source | Admitting: Cardiology

## 2021-08-02 VITALS — BP 106/70 | HR 62 | Ht 68.0 in | Wt 180.0 lb

## 2021-08-02 DIAGNOSIS — I480 Paroxysmal atrial fibrillation: Secondary | ICD-10-CM

## 2021-08-02 DIAGNOSIS — R791 Abnormal coagulation profile: Secondary | ICD-10-CM | POA: Diagnosis not present

## 2021-08-02 DIAGNOSIS — I639 Cerebral infarction, unspecified: Secondary | ICD-10-CM | POA: Diagnosis not present

## 2021-08-02 NOTE — Patient Instructions (Addendum)
Medication Instructions:  Your physician recommends that you continue on your current medications as directed. Please refer to the Current Medication list given to you today.  *If you need a refill on your cardiac medications before your next appointment, please call your pharmacy*   Lab Work: Today: BMET If you have labs (blood work) drawn today and your tests are completely normal, you will receive your results only by: [place] (if you have MyChart) OR A paper copy in the mail If you have any lab test that is abnormal or we need to change your treatment, we will call you to review the results.   Testing/Procedures: Watchman workup:  Your physician has requested that you have cardiac CT. Cardiac computed tomography (CT) is a painless test that uses an x-ray machine to take clear, detailed pictures of your heart.  Please follow instructions below under other instructions.    Follow-Up: At [facility], you and your health needs are our priority.  As part of our continuing mission to provide you with exceptional heart care, we have created designated Provider Care Teams.  These Care Teams include your primary Cardiologist (physician) and Advanced Practice Providers (APPs -  Physician Assistants and Nurse Practitioners) who all work together to provide you with the care you need, when you need it.   Your next appointment:   To be  determined  The format for your next appointment:   In Person  Provider:   Lars Mage, MD    Thank you for choosing [facility]!!    Other Instructions   WATCHMAN CT INSTRUCTIONS: Your WATCHMAN CT will be scheduled at [facility]: [address] [place], [place] [address] [phone]  Please arrive at the [facility] main entrance (entrance A) of [facility] 30 minutes prior to test start time. Proceed to the [facility] Radiology Department (first floor) to check-in and test prep.  Please follow  these instructions carefully:  Hold all erectile dysfunction medications at least 3 days (72 hrs) prior to test.  On the Night Before the Test: Be sure to Drink plenty of water. Do not consume any caffeinated/decaffeinated beverages or chocolate 12 hours prior to your test. Do not take any antihistamines 12 hours prior to your test.  On the Day of the Test: Drink plenty of water until 1 hour prior to the test. Do not eat any food 4 hours prior to the test. You may take your regular medications prior to the test.  Take metoprolol (Lopressor) two hours prior to test  HOLD Furosemide/Hydrochlorothiazide morning of the test. FEMALES- please wear underwire-free bra if available      After the Test: Drink plenty of water. After receiving IV contrast, you may experience a mild flushed feeling. This is normal. On occasion, you may experience a mild rash up to 24 hours after the test. This is not dangerous. If this occurs, you can take Benadryl 25 mg and increase your fluid intake. If you experience trouble breathing, this can be serious. If it is severe call 911 IMMEDIATELY. If it is mild, please call our office. If you take any of these medications: Glipizide/Metformin, Avandament, Glucavance, please do not take 48 hours after completing test unless otherwise instructed.  Once we have confirmed authorization from your insurance company, you will be called to set up a date and time for your test.   For non-scheduling related questions/concerns about your CT scan, please contact the cardiac imaging nurses: Marchia Bond, Cardiac Imaging  Nurse Navigator Gordy Clement, Cardiac Imaging Nurse Navigator [facility] Direct Office Dial: [phone]   For scheduling needs, including cancellations and rescheduling, please call [place], [phone].  Lenice Llamas, the Watchman Nurse Navigator, will call you after your CT once the [facility] Team has reviewed your imaging for  an update on proceedings. [place]'s direct number is [phone] if you need assistance.     AFIB CLINIC INFORMATION: Your appointment is scheduled on: _____________ at ____________. Please arrive 15 minutes early for check-in. The [facility] Clinic is located in the Heart and Vascular Specialty Clinics at [facility]. Parking instructions/directions: [address] (off [employer]). When you pull in to Entrance C, there is an underground parking garage to your right. The code to enter the garage is ___________. Take the elevators to the first floor. Follow the signs to the Heart and Vascular Specialty Clinics. You will see registration at the end of the hallway.  Phone number: [phone]
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
## 2021-08-03 ENCOUNTER — Ambulatory Visit (INDEPENDENT_AMBULATORY_CARE_PROVIDER_SITE_OTHER): Payer: No Typology Code available for payment source | Admitting: Dermatology

## 2021-08-03 DIAGNOSIS — L820 Inflamed seborrheic keratosis: Secondary | ICD-10-CM | POA: Diagnosis not present

## 2021-08-03 DIAGNOSIS — L720 Epidermal cyst: Secondary | ICD-10-CM | POA: Diagnosis not present

## 2021-08-03 DIAGNOSIS — L719 Rosacea, unspecified: Secondary | ICD-10-CM | POA: Diagnosis not present

## 2021-08-03 DIAGNOSIS — L570 Actinic keratosis: Secondary | ICD-10-CM

## 2021-08-03 DIAGNOSIS — D229 Melanocytic nevi, unspecified: Secondary | ICD-10-CM

## 2021-08-03 DIAGNOSIS — L814 Other melanin hyperpigmentation: Secondary | ICD-10-CM

## 2021-08-03 DIAGNOSIS — L578 Other skin changes due to chronic exposure to nonionizing radiation: Secondary | ICD-10-CM | POA: Diagnosis not present

## 2021-08-03 DIAGNOSIS — D225 Melanocytic nevi of trunk: Secondary | ICD-10-CM

## 2021-08-03 DIAGNOSIS — D492 Neoplasm of unspecified behavior of bone, soft tissue, and skin: Secondary | ICD-10-CM

## 2021-08-03 DIAGNOSIS — L821 Other seborrheic keratosis: Secondary | ICD-10-CM

## 2021-08-03 DIAGNOSIS — Z1283 Encounter for screening for malignant neoplasm of skin: Secondary | ICD-10-CM

## 2021-08-03 DIAGNOSIS — D1801 Hemangioma of skin and subcutaneous tissue: Secondary | ICD-10-CM

## 2021-08-03 HISTORY — DX: Melanocytic nevi, unspecified: D22.9

## 2021-08-03 NOTE — Patient Instructions (Addendum)
Pre-Operative Instructions  You are scheduled for a surgical procedure at [facility]. We recommend you read the following instructions. If you have any questions or concerns, please call the office at [phone].  Shower and wash the entire body with soap and water the day of your surgery paying special attention to cleansing at and around the planned surgery site.  Avoid aspirin or aspirin containing products at least fourteen (14) days prior to your surgical procedure and for at least one week (7 Days) after your surgical procedure. If you take aspirin on a regular basis for heart disease or history of stroke or for any other reason, we may recommend you continue taking aspirin but please notify us if you take this on a regular basis. Aspirin can cause more bleeding to occur during surgery as well as prolonged bleeding and bruising after surgery.   Avoid other nonsteroidal pain medications at least one week prior to surgery and at least one week prior to your surgery. These include medications such as Ibuprofen (Motrin, Advil and Nuprin), Naprosyn, Voltaren, Relafen, etc. If medications are used for therapeutic reasons, please inform us as they can cause increased bleeding or prolonged bleeding during and bruising after surgical procedures.   Please advise [place] if you are taking any "blood thinner" medications such as Coumadin or Dipyridamole or Plavix or similar medications. These cause increased bleeding and prolonged bleeding during procedures and bruising after surgical procedures. We may have to consider discontinuing these medications briefly prior to and shortly after your surgery if safe to do so.   Please inform us of all medications you are currently taking. All medications that are taken regularly should be taken the day of surgery as you always do. Nevertheless, we need to be informed of what medications you are taking prior to surgery to know whether they will affect the  procedure or cause any complications.   Please inform us of any medication allergies. Also inform us of whether you have allergies to Latex or rubber products or whether you have had any adverse reaction to Lidocaine or Epinephrine.  Please inform us of any prosthetic or artificial body parts such as artificial heart valve, joint replacements, etc., or similar condition that might require preoperative antibiotics.   We recommend avoidance of alcohol at least two weeks prior to surgery and continued avoidance for at least two weeks after surgery.   We recommend discontinuation of tobacco smoking at least two weeks prior to surgery and continued abstinence for at least two weeks after surgery.  Do not plan strenuous exercise, strenuous work or strenuous lifting for approximately four weeks after your surgery.   We request if you are unable to make your scheduled surgical appointment, please call us at least a week in advance or as soon as you are aware of a problem so that we can cancel or reschedule the appointment.   You MAY TAKE TYLENOL (acetaminophen) for pain as it is not a blood thinner.   PLEASE PLAN TO BE IN TOWN FOR TWO WEEKS FOLLOWING SURGERY, THIS IS IMPORTANT SO YOU CAN BE CHECKED FOR DRESSING CHANGES, SUTURE REMOVAL AND TO MONITOR FOR POSSIBLE COMPLICATIONS.      If you have any questions or concerns for your doctor, please call our main line at [phone] and press option 4 to reach your doctor's medical assistant. If no one answers, please leave a voicemail as directed and we will return your call as soon as possible. Messages left after 4  pm will be answered the following business day.   You may also send [place] a message via [place]. We typically respond to MyChart messages within 1-2 business days.  For prescription refills, please ask your pharmacy to contact our office. Our fax number is [phone].  If you have an urgent issue when the clinic is closed that cannot wait  until the next business day, you can page your doctor at the number below.    Please note that while we do our best to be available for urgent issues outside of office hours, we are not available 24/7.   If you have an urgent issue and are unable to reach [place], you may choose to seek medical care at your doctor's office, retail clinic, urgent care center, or emergency room.  If you have a medical emergency, please immediately call 911 or go to the emergency department.  Pager Numbers  - Dr. Nehemiah Massed: [phone]  - Dr. Laurence Ferrari: [phone]  - Dr. Nicole Kindred: [phone]  In the event of inclement weather, please call our main line at [phone] for an update on the status of any delays or closures.  Dermatology Medication Tips: Please keep the boxes that topical medications come in in order to help keep track of the instructions about where and how to use these. Pharmacies typically print the medication instructions only on the boxes and not directly on the medication tubes.   If your medication is too expensive, please contact our office at [phone] option 4 or send [place] a message through [place].   We are unable to tell what your co-pay for medications will be in advance as this is different depending on your insurance coverage. However, we may be able to find a substitute medication at lower cost or fill out paperwork to get insurance to cover a needed medication.   If a prior authorization is required to get your medication covered by your insurance company, please allow [place] 1-2 business days to complete this process.  Drug prices often vary depending on where the prescription is filled and some pharmacies may offer cheaper prices.  The website www.goodrx.com contains coupons for medications through different pharmacies. The prices here do not account for what the cost may be with help from insurance (it may be cheaper with your insurance), but the website can give you the price if you  did not use any insurance.  - You can print the associated coupon and take it with your prescription to the pharmacy.  - You may also stop by our office during regular business hours and pick up a GoodRx coupon card.  - If you need your prescription sent electronically to a different pharmacy, notify our office through [facility] or by phone at [phone] option 4.   Wound Care Instructions  Cleanse wound gently with soap and water once a day then pat dry with clean gauze. Apply a thing coat of Petrolatum (petroleum jelly, "Vaseline") over the wound (unless you have an allergy to this). We recommend that you use a new, sterile tube of Vaseline. Do not pick or remove scabs. Do not remove the yellow or white "healing tissue" from the base of the wound.  Cover the wound with fresh, clean, nonstick gauze and secure with paper tape. You may use Band-Aids in place of gauze and tape if the would is small enough, but would recommend trimming much of the tape off as there is often too much. Sometimes Band-Aids can irritate the skin.  You  should call the office for your biopsy report after 1 week if you have not already been contacted.  If you experience any problems, such as abnormal amounts of bleeding, swelling, significant bruising, significant pain, or evidence of infection, please call the office immediately.  FOR ADULT SURGERY PATIENTS: If you need something for pain relief you may take 1 extra strength Tylenol (acetaminophen) AND 2 Ibuprofen (200mg  each) together every 4 hours as needed for pain. (do not take these if you are allergic to them or if you have a reason you should not take them.) Typically, you may only need pain medication for 1 to 3 days.

## 2021-08-03 NOTE — Progress Notes (Signed)
Follow-Up Visit   Subjective  Michelle Norris is a 50 y.o. female who presents for the following: growths (Back, R axilla, ), brown spots (Face, no treatment), and Total body skin exam (No hx of skin ca). The patient presents for Total-Body Skin Exam (TBSE) for skin cancer screening and mole check.  The following portions of the chart were reviewed this encounter and updated as appropriate:   Tobacco  Allergies  Meds  Problems  Med Hx  Surg Hx  Fam Hx     Review of Systems:  No other skin or systemic complaints except as noted in HPI or Assessment and Plan.  Objective  Well appearing patient in no apparent distress; mood and affect are within normal limits.  A full examination was performed including scalp, head, eyes, ears, nose, lips, neck, chest, axillae, abdomen, back, buttocks, bilateral upper extremities, bilateral lower extremities, hands, feet, fingers, toes, fingernails, and toenails. All findings within normal limits unless otherwise noted below.  face Erythema, dilated vessels cheeks  nose x 1 Pink scaly macule   R post shoulder, L mid upper back paraspinal, R axilla 0.7cm cystic paps R post shoulder, L mid upper back paraspinal R axilla cystic pap 0.7cm  L sacral 0.6cm irregular brown macule     back x 25, Total = 25 (25) Erythematous keratotic or waxy stuck-on papule or plaque.    Assessment & Plan   Lentigines - Scattered tan macules - Due to sun exposure - Benign-appearing, observe - Recommend daily broad spectrum sunscreen SPF 30+ to sun-exposed areas, reapply every 2 hours as needed. - Call for any changes  Seborrheic Keratoses - Stuck-on, waxy, tan-brown papules and/or plaques  - Benign-appearing - Discussed benign etiology and prognosis. - Observe - Call for any changes  Melanocytic Nevi - Tan-brown and/or pink-flesh-colored symmetric macules and papules - Benign appearing on exam today - Observation - Call clinic for new or changing  moles - Recommend daily use of broad spectrum spf 30+ sunscreen to sun-exposed areas.   Hemangiomas - Red papules - Discussed benign nature - Observe - Call for any changes  Actinic Damage - Chronic condition, secondary to cumulative UV/sun exposure - diffuse scaly erythematous macules with underlying dyspigmentation - Recommend daily broad spectrum sunscreen SPF 30+ to sun-exposed areas, reapply every 2 hours as needed.  - Staying in the shade or wearing long sleeves, sun glasses (UVA+UVB protection) and wide brim hats (4-inch brim around the entire circumference of the hat) are also recommended for sun protection.  - Call for new or changing lesions.  Skin cancer screening performed today.  Rosacea face  Rosacea is a chronic progressive skin condition usually affecting the face of adults, causing redness and/or acne bumps. It is treatable but not curable. It sometimes affects the eyes (ocular rosacea) as well. It may respond to topical and/or systemic medication and can flare with stress, sun exposure, alcohol, exercise and some foods.  Daily application of broad spectrum spf 30+ sunscreen to face is recommended to reduce flares.  Discussed BBL $350 per txt session, several txts for best results  AK (actinic keratosis) nose x 1  Destruction of lesion - nose x 1 Complexity: simple   Destruction method: cryotherapy   Informed consent: discussed and consent obtained   Timeout:  patient name, date of birth, surgical site, and procedure verified Lesion destroyed using liquid nitrogen: Yes   Region frozen until ice ball extended beyond lesion: Yes   Outcome: patient tolerated procedure well with no  complications   Post-procedure details: wound care instructions given    Epidermal cyst R post shoulder, L mid upper back paraspinal, R axilla  Benign, Discussed excising one at a time  Neoplasm of skin L sacral  Epidermal / dermal shaving  Lesion diameter (cm):  0.6 Informed  consent: discussed and consent obtained   Timeout: patient name, date of birth, surgical site, and procedure verified   Procedure prep:  Patient was prepped and draped in usual sterile fashion Prep type:  Isopropyl alcohol Anesthesia: the lesion was anesthetized in a standard fashion   Anesthetic:  1% lidocaine w/ epinephrine 1-100,000 buffered w/ 8.4% NaHCO3 Instrument used: flexible razor blade   Hemostasis achieved with: pressure, aluminum chloride and electrodesiccation   Outcome: patient tolerated procedure well   Post-procedure details: sterile dressing applied and wound care instructions given   Dressing type: bandage and petrolatum    Specimen 1 - Surgical pathology Differential Diagnosis: D48.5 Nevus vs Dysplastic nevus  Check Margins: yes 0.6cm irregular brown macule  Inflamed seborrheic keratosis back x 25, Total = 25  Destruction of lesion - back x 25, Total = 25 Complexity: simple   Destruction method: cryotherapy   Informed consent: discussed and consent obtained   Timeout:  patient name, date of birth, surgical site, and procedure verified Lesion destroyed using liquid nitrogen: Yes   Region frozen until ice ball extended beyond lesion: Yes   Outcome: patient tolerated procedure well with no complications   Post-procedure details: wound care instructions given    Skin cancer screening  Return for to be scheduled for 3 surgeries cysts.  I, Othelia Pulling, RMA, am acting as scribe for Sarina Ser, MD . Documentation: I have reviewed the above documentation for accuracy and completeness, and I agree with the above.  Sarina Ser, MD

## 2021-08-04 ENCOUNTER — Ambulatory Visit: Payer: No Typology Code available for payment source | Admitting: Physical Therapy

## 2021-08-04 ENCOUNTER — Other Ambulatory Visit: Payer: Self-pay

## 2021-08-04 DIAGNOSIS — R293 Abnormal posture: Secondary | ICD-10-CM

## 2021-08-04 DIAGNOSIS — M62838 Other muscle spasm: Secondary | ICD-10-CM

## 2021-08-04 LAB — BASIC METABOLIC PANEL
BUN/Creatinine Ratio: 27 — ABNORMAL HIGH (ref 9–23)
BUN: 15 mg/dL (ref 6–24)
CO2: 29 mmol/L (ref 20–29)
Calcium: 9.3 mg/dL (ref 8.7–10.2)
Chloride: 101 mmol/L (ref 96–106)
Creatinine, Ser: 0.56 mg/dL — ABNORMAL LOW (ref 0.57–1.00)
Glucose: 93 mg/dL (ref 70–99)
Potassium: 4.5 mmol/L (ref 3.5–5.2)
Sodium: 139 mmol/L (ref 134–144)
eGFR: 110 mL/min/{1.73_m2} (ref >59)

## 2021-08-04 NOTE — Patient Instructions (Signed)
Red band behind back Thumb out  Pull on exhale apart enough for 30 deg of shoulder abduction   20 reps   __  Rotator cuff  Blue loop at doorknob   Change chair distance for internal and external rotation 45 deg from midline  Exhale on pull 20 reps   __  Elbow forearm press with clamshells Elbow slightly ahead of should. 45 deg forearm position, shoulder blade on back , chin tucked  20 reps  ___  Blue loop  at wrists Elbows bent, Forward and elbows back movement 20  reps   Open movement20 deg from side of body 20reps
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
## 2021-08-06 ENCOUNTER — Encounter: Payer: Self-pay | Admitting: Dermatology

## 2021-08-07 ENCOUNTER — Telehealth: Payer: Self-pay

## 2021-08-07 NOTE — Telephone Encounter (Signed)
Advised pt of bx results/sh ?

## 2021-08-07 NOTE — Telephone Encounter (Signed)
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

## 2021-08-08 ENCOUNTER — Telehealth (HOSPITAL_COMMUNITY): Payer: Self-pay | Admitting: Emergency Medicine

## 2021-08-08 NOTE — Telephone Encounter (Signed)
Reaching out to patient to offer assistance regarding upcoming cardiac imaging study; pt verbalizes understanding of appt date/time, parking situation and where to check in, pre-test NPO status and medications ordered, and verified current allergies; name and call back number provided for further questions should they arise Michelle Bond RN Navigator Cardiac Imaging [facility] [phone] office [phone] cell  R arm IV preferred

## 2021-08-08 NOTE — Telephone Encounter (Signed)
Attempted to call patient regarding upcoming cardiac CT appointment. °Left message on voicemail with name and callback number °Iyla Balzarini RN Navigator Cardiac Imaging °[facility] °[phone] Office °[phone] Cell ° °

## 2021-08-10 ENCOUNTER — Ambulatory Visit (HOSPITAL_COMMUNITY)
Admission: RE | Admit: 2021-08-10 | Discharge: 2021-08-10 | Disposition: A | Discharge status: Home / Self Care | Payer: No Typology Code available for payment source | Source: Ambulatory Visit | Attending: Cardiology | Admitting: Cardiology

## 2021-08-10 ENCOUNTER — Other Ambulatory Visit: Payer: Self-pay

## 2021-08-10 DIAGNOSIS — I480 Paroxysmal atrial fibrillation: Secondary | ICD-10-CM

## 2021-08-10 MED ORDER — NITROGLYCERIN 0.4 MG SL SUBL: 0.8000 mg | SUBLINGUAL_TABLET | Freq: Once | SUBLINGUAL | Status: DC

## 2021-08-10 MED ORDER — IOHEXOL 350 MG/ML SOLN
100.0000 mL | Freq: Once | INTRAVENOUS | Status: AC | PRN
  Administered 2021-08-10: 80 mL via INTRAVENOUS

## 2021-08-11 ENCOUNTER — Ambulatory Visit: Payer: No Typology Code available for payment source | Admitting: Physical Therapy

## 2021-08-16 ENCOUNTER — Ambulatory Visit: Payer: No Typology Code available for payment source | Admitting: Physical Therapy

## 2021-08-17 ENCOUNTER — Telehealth: Payer: Self-pay

## 2021-08-17 NOTE — Telephone Encounter (Signed)
Reviewed results with patient who verbalized understanding.   Per Dr. Quentin Ore, the patient will need weekly INR checks prior to [place]. Scheduled the patient 12/15 for Watchman implant. Her Coumadin is managed at [facility]. She will contact the nurse and arrange weekly checks. Scheduled her for pre-procedure visit with Kathyrn Drown 09/18/2021. She will have Covid test that day as well. She was grateful for call and agrees with plan.

## 2021-08-21 ENCOUNTER — Other Ambulatory Visit: Payer: Self-pay

## 2021-08-21 DIAGNOSIS — I639 Cerebral infarction, unspecified: Secondary | ICD-10-CM

## 2021-08-21 DIAGNOSIS — I480 Paroxysmal atrial fibrillation: Secondary | ICD-10-CM

## 2021-08-22 ENCOUNTER — Other Ambulatory Visit: Payer: Self-pay | Admitting: Physician Assistant

## 2021-08-22 DIAGNOSIS — Z1231 Encounter for screening mammogram for malignant neoplasm of breast: Secondary | ICD-10-CM

## 2021-08-23 ENCOUNTER — Telehealth: Payer: Self-pay

## 2021-08-23 ENCOUNTER — Encounter: Payer: No Typology Code available for payment source | Admitting: Physical Therapy

## 2021-08-23 NOTE — Telephone Encounter (Signed)
Patient came in today to adjust some appointments for the future. She would like a prescription to have for rosacea flares as needed. Can we send something in for her?   She did schedule a BBL but not until February.  Patient was just here for TBSE.

## 2021-08-24 NOTE — Telephone Encounter (Signed)
Left msg for pt to return my call

## 2021-08-28 ENCOUNTER — Ambulatory Visit
Payer: No Typology Code available for payment source | Attending: Obstetrics and Gynecology | Admitting: Physical Therapy

## 2021-08-28 ENCOUNTER — Other Ambulatory Visit: Payer: Self-pay

## 2021-08-28 DIAGNOSIS — M62838 Other muscle spasm: Secondary | ICD-10-CM | POA: Diagnosis present

## 2021-08-28 DIAGNOSIS — M533 Sacrococcygeal disorders, not elsewhere classified: Secondary | ICD-10-CM | POA: Insufficient documentation

## 2021-08-28 DIAGNOSIS — R293 Abnormal posture: Secondary | ICD-10-CM | POA: Diagnosis not present

## 2021-08-28 MED ORDER — METRONIDAZOLE 0.75 % EX GEL: 1.0000 "application " | Freq: Two times a day (BID) | CUTANEOUS | 2 refills | Status: AC

## 2021-08-28 NOTE — Addendum Note (Signed)
Addended by: Johnsie Kindred R on: 08/28/2021 03:40 PM   Modules accepted: Orders

## 2021-08-28 NOTE — Telephone Encounter (Signed)
Patient advised of information per Dr. Nehemiah Massed. She would like to try Metronidazole first and then call if too expensive or no improvement.

## 2021-08-28 NOTE — Patient Instructions (Signed)
Side stepping without band,  Pay attention to equal stance, more weight across pinky edge, to lift arch and thigh out  __  Refer cardiac rehab as intermediary step to fitness with consideration of cardiac procedure
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
## 2021-08-30 ENCOUNTER — Encounter: Payer: No Typology Code available for payment source | Admitting: Physical Therapy

## 2021-09-05 ENCOUNTER — Telehealth: Payer: Self-pay

## 2021-09-08 ENCOUNTER — Encounter: Payer: No Typology Code available for payment source | Admitting: Physical Therapy

## 2021-09-13 ENCOUNTER — Telehealth: Payer: Self-pay

## 2021-09-13 NOTE — Telephone Encounter (Signed)
Ms. Fahs is currently having weekly INR checks for LAAO scheduled 09/21/2021. She is scheduled 09/18/2021 for pre-procedure visit, Covid test, and to have INR drawn a last time prior to procedure. Below are her weekly INR checks to date.  Will route to Dr. Quentin Ore for recommendations prior to [place] visit.

## 2021-09-14 ENCOUNTER — Encounter: Payer: No Typology Code available for payment source | Admitting: Physical Therapy

## 2021-09-14 NOTE — Telephone Encounter (Signed)
Discussed with Dr. Quentin Ore. Due to subtherapeutic INR readings, the patient's LAAO scheduled 09/21/22 will be cancelled.  Will offer to establish with [facility] for management prior to [place].  Discussed above plan with patient and with Pharm D. The patient wishes to establish with [facility] for management. Scheduled her for INR tomorrow as the patient would like to try complete LAAO 10/12/21. She was grateful for call and agrees with plan.   Confirmed with Ms. Stumpo her Coumadin dosing. Prior to 09/11/21 subtherapeutic INR reading of 1.3, she was taking 12.5mg  M-F and 10mg  on weekends. Since 1.3 reading, she is taking 12.5mg  daily.   Will send to Coumadin Clinic as FYI.

## 2021-09-15 ENCOUNTER — Ambulatory Visit (INDEPENDENT_AMBULATORY_CARE_PROVIDER_SITE_OTHER): Payer: No Typology Code available for payment source | Admitting: *Deleted

## 2021-09-15 ENCOUNTER — Other Ambulatory Visit: Payer: Self-pay

## 2021-09-15 ENCOUNTER — Encounter: Payer: No Typology Code available for payment source | Admitting: Physical Therapy

## 2021-09-15 ENCOUNTER — Ambulatory Visit
Admission: RE | Admit: 2021-09-15 | Discharge: 2021-09-15 | Disposition: A | Discharge status: Home / Self Care | Payer: No Typology Code available for payment source | Source: Ambulatory Visit | Attending: Physician Assistant | Admitting: Physician Assistant

## 2021-09-15 DIAGNOSIS — I4891 Unspecified atrial fibrillation: Secondary | ICD-10-CM | POA: Diagnosis not present

## 2021-09-15 DIAGNOSIS — Z1231 Encounter for screening mammogram for malignant neoplasm of breast: Secondary | ICD-10-CM | POA: Insufficient documentation

## 2021-09-15 DIAGNOSIS — I639 Cerebral infarction, unspecified: Secondary | ICD-10-CM

## 2021-09-15 DIAGNOSIS — Z7901 Long term (current) use of anticoagulants: Secondary | ICD-10-CM | POA: Insufficient documentation

## 2021-09-15 LAB — POCT INR: INR: 1.6 — AB (ref 2.0–3.0)

## 2021-09-15 NOTE — Patient Instructions (Addendum)
A full discussion of the nature of anticoagulants has been carried out.  A benefit risk analysis has been presented to the patient, so that they understand the justification for choosing anticoagulation at this time. The need for frequent and regular monitoring, precise dosage adjustment and compliance is stressed.  Side effects of potential bleeding are discussed.  The patient should avoid any OTC items containing aspirin or ibuprofen, and should avoid great swings in general diet.  Avoid alcohol consumption.  Call if any signs of abnormal bleeding.   Description   Today take 15mg , then start taking 12.5mg  daily. Recheck INR on Wednesday. Be consistent with leafy intake and protein drinks. Recheck INR in 1 week. Coumadin Clinic [phone]

## 2021-09-18 ENCOUNTER — Other Ambulatory Visit (HOSPITAL_COMMUNITY): Payer: No Typology Code available for payment source

## 2021-09-18 ENCOUNTER — Ambulatory Visit: Payer: No Typology Code available for payment source | Admitting: Cardiology

## 2021-09-20 ENCOUNTER — Ambulatory Visit (INDEPENDENT_AMBULATORY_CARE_PROVIDER_SITE_OTHER): Payer: No Typology Code available for payment source | Admitting: *Deleted

## 2021-09-20 ENCOUNTER — Other Ambulatory Visit: Payer: Self-pay

## 2021-09-20 ENCOUNTER — Other Ambulatory Visit: Payer: Self-pay | Admitting: Dermatology

## 2021-09-20 DIAGNOSIS — I639 Cerebral infarction, unspecified: Secondary | ICD-10-CM | POA: Diagnosis not present

## 2021-09-20 DIAGNOSIS — Z7901 Long term (current) use of anticoagulants: Secondary | ICD-10-CM

## 2021-09-20 DIAGNOSIS — R233 Spontaneous ecchymoses: Secondary | ICD-10-CM

## 2021-09-20 DIAGNOSIS — I4891 Unspecified atrial fibrillation: Secondary | ICD-10-CM

## 2021-09-20 LAB — POCT INR: INR: 2.3 (ref 2.0–3.0)

## 2021-09-20 NOTE — Patient Instructions (Addendum)
Description   Start taking Warfarin 12.5mg  daily except 15mg  on Wednesdays. Recheck INR in 1 week. Be consistent with leafy intake and protein drinks. Coumadin Clinic [phone]

## 2021-09-22 ENCOUNTER — Encounter: Payer: No Typology Code available for payment source | Admitting: Physical Therapy

## 2021-09-26 ENCOUNTER — Encounter: Payer: No Typology Code available for payment source | Admitting: Dermatology

## 2021-09-27 ENCOUNTER — Ambulatory Visit (INDEPENDENT_AMBULATORY_CARE_PROVIDER_SITE_OTHER): Payer: No Typology Code available for payment source | Admitting: *Deleted

## 2021-09-27 ENCOUNTER — Other Ambulatory Visit: Payer: Self-pay

## 2021-09-27 DIAGNOSIS — Z7901 Long term (current) use of anticoagulants: Secondary | ICD-10-CM | POA: Diagnosis not present

## 2021-09-27 DIAGNOSIS — I639 Cerebral infarction, unspecified: Secondary | ICD-10-CM | POA: Diagnosis not present

## 2021-09-27 DIAGNOSIS — I4891 Unspecified atrial fibrillation: Secondary | ICD-10-CM

## 2021-09-27 LAB — POCT INR: INR: 2.2 (ref 2.0–3.0)

## 2021-09-27 NOTE — Patient Instructions (Signed)
Description   Continue taking Warfarin 12.5mg  daily except 15mg  on Wednesdays. Recheck INR in 1 week. Be consistent with leafy intake and protein drinks. Coumadin Clinic [phone]

## 2021-09-29 ENCOUNTER — Encounter: Payer: No Typology Code available for payment source | Admitting: Physical Therapy

## 2021-09-29 ENCOUNTER — Telehealth: Payer: Self-pay | Admitting: Cardiology

## 2021-09-29 NOTE — Telephone Encounter (Signed)
° °  Pt said he insurance called her that her procedure on 10/12/20 has been denied and they advised Dr. Quentin Ore can to peer to peer wit the director to dispute it, insurance has not heard from dr. Quentin Ore that's why insurance called pt to let her know, she would like to get an update

## 2021-09-29 NOTE — Telephone Encounter (Signed)
Nurse Ander Purpura reaching out to provide contact info in the case that we are needing any additional questions answered... callback # [phone]

## 2021-10-03 NOTE — Telephone Encounter (Signed)
Left message on voicemail for nurse with [employer] to contact me in regards to discussing authorization for [place] that is scheduled on 10/12/2021.

## 2021-10-03 NOTE — Telephone Encounter (Signed)
Tadan Shill, RN Case Manager with [employer] returned my call and states that Dr Quentin Ore must do a peer to peer in regards to authorization for [place] procedure.  Per Avrum Kimball a letter was mailed on 12/14 to our office in regards to the need for peer to peer review and we have a 14 day turn around to contact the insurance (deadline 12/28).  Dyneshia Baccam provided the information needed for Dr Quentin Ore to complete this process. He needs to call [phone] and press option #3.  The reference number for the pt's case is [id number] and the medical director who is in charge of the pt's case is Dr Stann Mainland. I will forward this information to Dr Quentin Ore to complete peer to peer review.

## 2021-10-04 ENCOUNTER — Ambulatory Visit (INDEPENDENT_AMBULATORY_CARE_PROVIDER_SITE_OTHER): Payer: No Typology Code available for payment source | Admitting: *Deleted

## 2021-10-04 ENCOUNTER — Other Ambulatory Visit: Payer: Self-pay

## 2021-10-04 DIAGNOSIS — Z7901 Long term (current) use of anticoagulants: Secondary | ICD-10-CM

## 2021-10-04 DIAGNOSIS — I4891 Unspecified atrial fibrillation: Secondary | ICD-10-CM

## 2021-10-04 DIAGNOSIS — I639 Cerebral infarction, unspecified: Secondary | ICD-10-CM | POA: Diagnosis not present

## 2021-10-04 LAB — POCT INR: INR: 2.7 (ref 2.0–3.0)

## 2021-10-04 NOTE — Patient Instructions (Signed)
Description   Continue taking Warfarin 12.5mg  daily except 15mg  on Wednesdays. Recheck INR in 1 week. Be consistent with leafy intake and protein drinks. Coumadin Clinic [phone]

## 2021-10-04 NOTE — Telephone Encounter (Signed)
Per Dr Quentin Ore: this expired yesterday. please make sure someone appeals or discusses again with the insurance company  We will reach out to our precert department to determine next steps.

## 2021-10-06 ENCOUNTER — Encounter: Payer: No Typology Code available for payment source | Admitting: Physical Therapy

## 2021-10-10 ENCOUNTER — Encounter: Payer: Self-pay | Admitting: Dermatology

## 2021-10-10 NOTE — Telephone Encounter (Signed)
The patient's procedure was denied by insurance today.  An appeal has been made and we are awaiting response. The patient is aware of situation. She opts to keep appointment for tomorrow (Covid test and pre-procedure visit) in case it does get approved. She understands pre-procedure visit will count for next available surgery day (2/2) if more time is needed for insurance.  She was grateful for call and agrees with plan.

## 2021-10-11 ENCOUNTER — Other Ambulatory Visit: Payer: Self-pay

## 2021-10-11 ENCOUNTER — Ambulatory Visit (INDEPENDENT_AMBULATORY_CARE_PROVIDER_SITE_OTHER): Payer: No Typology Code available for payment source | Admitting: Physician Assistant

## 2021-10-11 ENCOUNTER — Encounter: Payer: Self-pay | Admitting: Physician Assistant

## 2021-10-11 ENCOUNTER — Ambulatory Visit (INDEPENDENT_AMBULATORY_CARE_PROVIDER_SITE_OTHER): Payer: No Typology Code available for payment source

## 2021-10-11 ENCOUNTER — Other Ambulatory Visit (HOSPITAL_COMMUNITY)
Admission: RE | Admit: 2021-10-11 | Discharge: 2021-10-11 | Disposition: A | Discharge status: Home / Self Care | Payer: No Typology Code available for payment source | Source: Ambulatory Visit | Attending: Cardiology | Admitting: Cardiology

## 2021-10-11 ENCOUNTER — Encounter (INDEPENDENT_AMBULATORY_CARE_PROVIDER_SITE_OTHER): Payer: Self-pay

## 2021-10-11 VITALS — BP 118/76 | HR 63 | Ht 68.0 in | Wt 180.0 lb

## 2021-10-11 DIAGNOSIS — Z7901 Long term (current) use of anticoagulants: Secondary | ICD-10-CM | POA: Diagnosis not present

## 2021-10-11 DIAGNOSIS — Z9884 Bariatric surgery status: Secondary | ICD-10-CM

## 2021-10-11 DIAGNOSIS — Z20822 Contact with and (suspected) exposure to covid-19: Secondary | ICD-10-CM | POA: Insufficient documentation

## 2021-10-11 DIAGNOSIS — I639 Cerebral infarction, unspecified: Secondary | ICD-10-CM

## 2021-10-11 DIAGNOSIS — I4891 Unspecified atrial fibrillation: Secondary | ICD-10-CM | POA: Diagnosis not present

## 2021-10-11 DIAGNOSIS — Z01818 Encounter for other preprocedural examination: Secondary | ICD-10-CM

## 2021-10-11 DIAGNOSIS — K909 Intestinal malabsorption, unspecified: Secondary | ICD-10-CM

## 2021-10-11 DIAGNOSIS — Z01812 Encounter for preprocedural laboratory examination: Secondary | ICD-10-CM | POA: Insufficient documentation

## 2021-10-11 DIAGNOSIS — I480 Paroxysmal atrial fibrillation: Secondary | ICD-10-CM | POA: Diagnosis not present

## 2021-10-11 LAB — POCT INR: INR: 3.1 — AB (ref 2.0–3.0)

## 2021-10-11 LAB — SARS CORONAVIRUS 2 (TAT 6-24 HRS): SARS Coronavirus 2: NEGATIVE

## 2021-10-11 NOTE — Telephone Encounter (Signed)
Spoke with the patient in detail. She understands her appeal is still pending with insurance.  She agrees her [place] for tomorrow should be cancelled. She understands she will be notified as soon as we have an update and will be scheduled for procedure if covered. She was grateful for call and agrees with plan.  Procedure cancelled with cath lab.  Dr. Quentin Ore notified.

## 2021-10-11 NOTE — Patient Instructions (Signed)
Description   Take 10mg  today, eat you something green and leafy today, then resume same dosage of Warfarin 12.5mg  daily except 15mg  on Wednesdays. Recheck INR in 1 week after [place] procedure. Be consistent with leafy intake and protein drinks. Coumadin Clinic [phone]

## 2021-10-11 NOTE — Patient Instructions (Signed)
Medication Instructions:  Your physician recommends that you continue on your current medications as directed. Please refer to the Current Medication list given to you today.  *If you need a refill on your cardiac medications before your next appointment, please call your pharmacy*   Lab Work: None ordered   If you have labs (blood work) drawn today and your tests are completely normal, you will receive your results only by: [place] (if you have MyChart) OR A paper copy in the mail If you have any lab test that is abnormal or we need to change your treatment, we will call you to review the results.   Testing/Procedures: Left Atrial Appendage Closure on Thursday, January 5th *Please refer to the instruction sheet given*   Follow-Up: Your follow up appointments will be arranged during your hospital stay.    Other Instructions None
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
## 2021-10-12 ENCOUNTER — Inpatient Hospital Stay (HOSPITAL_COMMUNITY)
Admission: RE | Admit: 2021-10-12 | Payer: No Typology Code available for payment source | Source: Home / Self Care | Admitting: Cardiology

## 2021-10-12 ENCOUNTER — Encounter (HOSPITAL_COMMUNITY): Admission: RE | Payer: No Typology Code available for payment source | Source: Home / Self Care

## 2021-10-12 SURGERY — LEFT ATRIAL APPENDAGE OCCLUSION
Anesthesia: General

## 2021-10-17 ENCOUNTER — Encounter: Payer: Self-pay | Admitting: Dermatology

## 2021-10-23 ENCOUNTER — Other Ambulatory Visit: Payer: Self-pay

## 2021-10-23 ENCOUNTER — Ambulatory Visit (INDEPENDENT_AMBULATORY_CARE_PROVIDER_SITE_OTHER): Payer: No Typology Code available for payment source | Admitting: Plastic Surgery

## 2021-10-23 VITALS — BP 126/63 | HR 72 | Ht 68.0 in | Wt 184.0 lb

## 2021-10-23 DIAGNOSIS — M793 Panniculitis, unspecified: Secondary | ICD-10-CM
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
## 2021-11-16 ENCOUNTER — Other Ambulatory Visit: Payer: Self-pay

## 2021-11-16 ENCOUNTER — Ambulatory Visit (INDEPENDENT_AMBULATORY_CARE_PROVIDER_SITE_OTHER): Payer: No Typology Code available for payment source

## 2021-11-16 DIAGNOSIS — Z5181 Encounter for therapeutic drug level monitoring: Secondary | ICD-10-CM | POA: Diagnosis not present

## 2021-11-16 DIAGNOSIS — Z7901 Long term (current) use of anticoagulants: Secondary | ICD-10-CM | POA: Diagnosis not present

## 2021-11-16 DIAGNOSIS — I639 Cerebral infarction, unspecified: Secondary | ICD-10-CM | POA: Diagnosis not present

## 2021-11-16 DIAGNOSIS — I4891 Unspecified atrial fibrillation: Secondary | ICD-10-CM | POA: Diagnosis not present

## 2021-11-16 LAB — POCT INR: INR: 1.8 — AB (ref 2.0–3.0)

## 2021-11-16 MED ORDER — WARFARIN SODIUM 10 MG PO TABS: 10.0000 mg | ORAL_TABLET | Freq: Every day | ORAL | 3 refills | Status: DC

## 2021-11-16 MED ORDER — WARFARIN SODIUM 5 MG PO TABS: 5.0000 mg | ORAL_TABLET | Freq: Every day | ORAL | 3 refills | Status: DC

## 2021-11-16 NOTE — Patient Instructions (Signed)
Description   Take 15mg  today, then START taking taking Warfarin 12.5mg  daily except 15mg  on Wednesdays. Recheck INR in 2 weeks.  Be consistent with leafy intake and protein drinks. Coumadin Clinic [phone]

## 2021-11-16 NOTE — Addendum Note (Signed)
Addended by: Leonidas Romberg on: 11/16/2021 01:43 PM   Modules accepted: Orders

## 2021-11-16 NOTE — Telephone Encounter (Signed)
Prescription refill request received for warfarin [place]: 10/11/2021 Next INR check: 11/30/21 Warfarin tablet strength: 5mg  & 10mg   Appropriate dose and refill sent to requested pharmacy.

## 2021-11-26 ENCOUNTER — Emergency Department: Payer: No Typology Code available for payment source

## 2021-11-26 ENCOUNTER — Other Ambulatory Visit: Payer: Self-pay

## 2021-11-26 DIAGNOSIS — Y92481 Parking lot as the place of occurrence of the external cause: Secondary | ICD-10-CM | POA: Diagnosis not present

## 2021-11-26 DIAGNOSIS — I509 Heart failure, unspecified: Secondary | ICD-10-CM | POA: Diagnosis not present

## 2021-11-26 DIAGNOSIS — I4891 Unspecified atrial fibrillation: Secondary | ICD-10-CM | POA: Insufficient documentation

## 2021-11-26 DIAGNOSIS — R791 Abnormal coagulation profile: Secondary | ICD-10-CM | POA: Insufficient documentation

## 2021-11-26 DIAGNOSIS — S82122A Displaced fracture of lateral condyle of left tibia, initial encounter for closed fracture: Secondary | ICD-10-CM | POA: Diagnosis not present

## 2021-11-26 DIAGNOSIS — S8992XA Unspecified injury of left lower leg, initial encounter: Secondary | ICD-10-CM | POA: Diagnosis present

## 2021-11-26 DIAGNOSIS — I110 Hypertensive heart disease with heart failure: Secondary | ICD-10-CM | POA: Insufficient documentation

## 2021-11-26 DIAGNOSIS — Z7902 Long term (current) use of antithrombotics/antiplatelets: Secondary | ICD-10-CM | POA: Diagnosis not present

## 2021-11-26 DIAGNOSIS — R519 Headache, unspecified: Secondary | ICD-10-CM | POA: Diagnosis not present

## 2021-11-26 DIAGNOSIS — Z79899 Other long term (current) drug therapy: Secondary | ICD-10-CM | POA: Diagnosis not present
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
## 2021-11-27 ENCOUNTER — Emergency Department: Payer: No Typology Code available for payment source

## 2021-11-27 ENCOUNTER — Emergency Department
Admission: EM | Admit: 2021-11-27 | Discharge: 2021-11-27 | Disposition: A | Discharge status: Home / Self Care | Payer: No Typology Code available for payment source | Attending: Emergency Medicine | Admitting: Emergency Medicine

## 2021-11-27 DIAGNOSIS — S82122A Displaced fracture of lateral condyle of left tibia, initial encounter for closed fracture: Secondary | ICD-10-CM

## 2021-11-27 LAB — BASIC METABOLIC PANEL
Anion gap: 4 — ABNORMAL LOW (ref 5–15)
BUN: 19 mg/dL (ref 6–20)
CO2: 29 mmol/L (ref 22–32)
Calcium: 8.8 mg/dL — ABNORMAL LOW (ref 8.9–10.3)
Chloride: 102 mmol/L (ref 98–111)
Creatinine, Ser: 0.48 mg/dL (ref 0.44–1.00)
GFR, Estimated: >60 mL/min (ref >60)
Glucose, Bld: 102 mg/dL — ABNORMAL HIGH (ref 70–99)
Potassium: 3.9 mmol/L (ref 3.5–5.1)
Sodium: 135 mmol/L (ref 135–145)

## 2021-11-27 LAB — CBC WITH DIFFERENTIAL/PLATELET
Abs Immature Granulocytes: 0.02 10*3/uL (ref 0.00–0.07)
Basophils Absolute: 0 10*3/uL (ref 0.0–0.1)
Basophils Relative: 0 %
Eosinophils Absolute: 0.1 10*3/uL (ref 0.0–0.5)
Eosinophils Relative: 1 %
HCT: 39.4 % (ref 36.0–46.0)
Hemoglobin: 12.7 g/dL (ref 12.0–15.0)
Immature Granulocytes: 0 %
Lymphocytes Relative: 25 %
Lymphs Abs: 1.9 10*3/uL (ref 0.7–4.0)
MCH: 28.5 pg (ref 26.0–34.0)
MCHC: 32.2 g/dL (ref 30.0–36.0)
MCV: 88.5 fL (ref 80.0–100.0)
Monocytes Absolute: 0.6 10*3/uL (ref 0.1–1.0)
Monocytes Relative: 8 %
Neutro Abs: 4.9 10*3/uL (ref 1.7–7.7)
Neutrophils Relative %: 66 %
Platelets: 243 10*3/uL (ref 150–400)
RBC: 4.45 MIL/uL (ref 3.87–5.11)
RDW: 13 % (ref 11.5–15.5)
WBC: 7.5 10*3/uL (ref 4.0–10.5)
nRBC: 0.4 % — ABNORMAL HIGH (ref 0.0–0.2)

## 2021-11-27 LAB — PROTIME-INR
INR: 1.6 — ABNORMAL HIGH (ref 0.8–1.2)
Prothrombin Time: 18.6 seconds — ABNORMAL HIGH (ref 11.4–15.2)

## 2021-11-27 MED ORDER — HYDROMORPHONE HCL 1 MG/ML IJ SOLN
1.0000 mg | Freq: Once | INTRAMUSCULAR | Status: AC
  Administered 2021-11-27: 1 mg via INTRAVENOUS
  Filled 2021-11-27: qty 1

## 2021-11-27 MED ORDER — OXYCODONE-ACETAMINOPHEN 5-325 MG PO TABS: 2.0000 | ORAL_TABLET | Freq: Four times a day (QID) | ORAL | 0 refills | Status: DC | PRN

## 2021-11-27 MED ORDER — ONDANSETRON HCL 4 MG/2ML IJ SOLN
4.0000 mg | Freq: Once | INTRAMUSCULAR | Status: AC
  Administered 2021-11-27: 4 mg via INTRAVENOUS
  Filled 2021-11-27: qty 2

## 2021-11-27 MED ORDER — DOCUSATE SODIUM 100 MG PO CAPS: 100.0000 mg | ORAL_CAPSULE | Freq: Two times a day (BID) | ORAL | 1 refills | Status: AC

## 2021-11-27 MED ORDER — ONDANSETRON 4 MG PO TBDP: 4.0000 mg | ORAL_TABLET | Freq: Four times a day (QID) | ORAL | 0 refills | Status: DC | PRN

## 2021-11-27 NOTE — ED Notes (Signed)
Discharge instructions, script info, and follow-up information provided to patient. Patient verbalized information. Patient assisted into wheelchair and then to awaiting vehicle.

## 2021-11-27 NOTE — Discharge Instructions (Addendum)
Please keep your knee immobilizer on at all times but you may take it off briefly to bathe.  You need to remain nonweightbearing on your left leg.  Please follow-up with Dr. Roland Rack with orthopedics.   You are being provided a prescription for opiates (also known as narcotics) for pain control.  Opiates can be addictive and should only be used when absolutely necessary for pain control when other alternatives do not work.  We recommend you only use them for the recommended amount of time and only as prescribed.  Please do not take with other sedative medications or alcohol.  Please do not drive, operate machinery, make important decisions while taking opiates.  Please note that these medications can be addictive and have high abuse potential.  Patients can become addicted to narcotics after only taking them for a few days.  Please keep these medications locked away from children, teenagers or any family members with history of substance abuse.  Narcotic pain medicine may also make you constipated.  You may use over-the-counter medications such as MiraLAX, Colace to prevent constipation.  If you become constipated, you may use over-the-counter enemas as needed.  Itching and nausea are also common side effects of narcotic pain medication.  If you develop uncontrolled vomiting or a rash, please stop these medications and seek medical care.
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
## 2021-11-28 ENCOUNTER — Encounter: Payer: No Typology Code available for payment source | Admitting: Dermatology

## 2021-11-30 ENCOUNTER — Ambulatory Visit: Payer: No Typology Code available for payment source | Admitting: Dermatology

## 2021-11-30 ENCOUNTER — Other Ambulatory Visit: Payer: Self-pay

## 2021-11-30 ENCOUNTER — Ambulatory Visit (INDEPENDENT_AMBULATORY_CARE_PROVIDER_SITE_OTHER): Payer: No Typology Code available for payment source | Admitting: *Deleted

## 2021-11-30 DIAGNOSIS — I4891 Unspecified atrial fibrillation: Secondary | ICD-10-CM | POA: Diagnosis not present

## 2021-11-30 DIAGNOSIS — I639 Cerebral infarction, unspecified: Secondary | ICD-10-CM | POA: Diagnosis not present

## 2021-11-30 DIAGNOSIS — Z7901 Long term (current) use of anticoagulants: Secondary | ICD-10-CM

## 2021-11-30 LAB — POCT INR: INR: 2.4 (ref 2.0–3.0)

## 2021-11-30 NOTE — Patient Instructions (Signed)
Description   Continue  taking taking Warfarin 12.5mg  daily except 15mg  on Wednesdays. Recheck INR in 3 weeks. Be consistent with leafy intake and protein drinks. [facility] [phone]

## 2021-12-05 ENCOUNTER — Encounter: Payer: No Typology Code available for payment source | Admitting: Dermatology

## 2021-12-12 ENCOUNTER — Encounter: Payer: No Typology Code available for payment source | Admitting: Dermatology

## 2021-12-15 ENCOUNTER — Other Ambulatory Visit: Payer: Self-pay | Admitting: Gastroenterology

## 2021-12-15 ENCOUNTER — Ambulatory Visit
Admission: RE | Admit: 2021-12-15 | Discharge: 2021-12-15 | Disposition: A | Discharge status: Home / Self Care | Payer: No Typology Code available for payment source | Source: Ambulatory Visit | Attending: Gastroenterology | Admitting: Gastroenterology

## 2021-12-15 ENCOUNTER — Other Ambulatory Visit (HOSPITAL_COMMUNITY): Payer: Self-pay | Admitting: Gastroenterology

## 2021-12-15 ENCOUNTER — Other Ambulatory Visit: Payer: Self-pay

## 2021-12-15 DIAGNOSIS — R197 Diarrhea, unspecified: Secondary | ICD-10-CM

## 2021-12-15 DIAGNOSIS — R1084 Generalized abdominal pain: Secondary | ICD-10-CM | POA: Insufficient documentation

## 2021-12-15 DIAGNOSIS — R1032 Left lower quadrant pain: Secondary | ICD-10-CM

## 2021-12-15 MED ORDER — IOHEXOL 300 MG/ML  SOLN
100.0000 mL | Freq: Once | INTRAMUSCULAR | Status: AC | PRN
  Administered 2021-12-15: 100 mL via INTRAVENOUS

## 2021-12-19 ENCOUNTER — Ambulatory Visit: Payer: Self-pay | Admitting: Dermatology

## 2021-12-21 ENCOUNTER — Ambulatory Visit (INDEPENDENT_AMBULATORY_CARE_PROVIDER_SITE_OTHER): Payer: No Typology Code available for payment source | Admitting: *Deleted

## 2021-12-21 ENCOUNTER — Other Ambulatory Visit: Payer: Self-pay

## 2021-12-21 DIAGNOSIS — I4891 Unspecified atrial fibrillation: Secondary | ICD-10-CM

## 2021-12-21 DIAGNOSIS — Z5181 Encounter for therapeutic drug level monitoring: Secondary | ICD-10-CM

## 2021-12-21 DIAGNOSIS — I639 Cerebral infarction, unspecified: Secondary | ICD-10-CM

## 2021-12-21 LAB — POCT INR: INR: 6.7 — AB (ref 2.0–3.0)

## 2021-12-21 LAB — PROTIME-INR
INR: 6.8 (ref 0.9–1.2)
Prothrombin Time: 63.6 s — ABNORMAL HIGH (ref 9.1–12.0)

## 2021-12-21 NOTE — Patient Instructions (Addendum)
Description   ?Called and spoke to pt and instructed her to hold her warfarin 3/16, 3/17 and 3/18. On 3/19 instructed pt to take '10mg'$  of warfarin. Then resume normal dose of Warfarin 12.'5mg'$  daily except '15mg'$  on Wednesdays. Recheck INR in  1 week. Be consistent with leafy intake and protein drinks. [facility] [phone] ?  ?  ?

## 2021-12-27 ENCOUNTER — Telehealth: Payer: Self-pay | Admitting: Cardiology

## 2021-12-27 ENCOUNTER — Other Ambulatory Visit: Payer: Self-pay

## 2021-12-27 ENCOUNTER — Ambulatory Visit (INDEPENDENT_AMBULATORY_CARE_PROVIDER_SITE_OTHER): Payer: No Typology Code available for payment source

## 2021-12-27 DIAGNOSIS — I4891 Unspecified atrial fibrillation: Secondary | ICD-10-CM

## 2021-12-27 DIAGNOSIS — Z7901 Long term (current) use of anticoagulants: Secondary | ICD-10-CM

## 2021-12-27 DIAGNOSIS — I639 Cerebral infarction, unspecified: Secondary | ICD-10-CM

## 2021-12-27 LAB — POCT INR: INR: 1.9 — AB (ref 2.0–3.0)

## 2021-12-27 NOTE — Telephone Encounter (Signed)
Patient states that her insurance has said that the Watchman can be resubmitted  ?Please call to discuss  ?

## 2021-12-27 NOTE — Telephone Encounter (Signed)
The patient states her insurance informed her that Watchman may be resubmitted for coverage. ?Reiterated to her that all appropriate documentation was submitted before and her procedure was still denied, but will try again.  ? ?She would like to be scheduled for LAAO 03/01/2022. ?Scheduled her for pre-procedure visit 02/05/2022. ?She understands she will need weekly INRs for 1 month prior to implant. ?She was grateful for call and agrees with plan.  ?

## 2021-12-27 NOTE — Patient Instructions (Signed)
-   don't have any greens in the next day or 2 ?- resume normal dose of Warfarin 12.'5mg'$  daily except '15mg'$  on Wednesdays.  ?- Recheck INR in  1 week.  ?Be consistent with leafy intake and protein drinks. [facility] [phone] ?

## 2022-01-03 ENCOUNTER — Ambulatory Visit (INDEPENDENT_AMBULATORY_CARE_PROVIDER_SITE_OTHER): Payer: No Typology Code available for payment source

## 2022-01-03 DIAGNOSIS — I639 Cerebral infarction, unspecified: Secondary | ICD-10-CM

## 2022-01-03 DIAGNOSIS — Z7901 Long term (current) use of anticoagulants: Secondary | ICD-10-CM | POA: Diagnosis not present

## 2022-01-03 DIAGNOSIS — I4891 Unspecified atrial fibrillation: Secondary | ICD-10-CM

## 2022-01-03 LAB — POCT INR: INR: 2.4 (ref 2.0–3.0)

## 2022-01-03 NOTE — Patient Instructions (Signed)
-   continue dose of Warfarin 12.'5mg'$  daily except '15mg'$  on Wednesdays.  ?- Recheck INR in 3 weeks ?Be consistent with leafy intake and protein drinks. [facility] [phone] ?

## 2022-01-22 ENCOUNTER — Ambulatory Visit (INDEPENDENT_AMBULATORY_CARE_PROVIDER_SITE_OTHER): Payer: No Typology Code available for payment source | Admitting: *Deleted

## 2022-01-22 DIAGNOSIS — Z7901 Long term (current) use of anticoagulants: Secondary | ICD-10-CM

## 2022-01-22 DIAGNOSIS — I639 Cerebral infarction, unspecified: Secondary | ICD-10-CM | POA: Diagnosis not present

## 2022-01-22 DIAGNOSIS — I4891 Unspecified atrial fibrillation: Secondary | ICD-10-CM | POA: Diagnosis not present

## 2022-01-22 LAB — POCT INR: INR: 2.4 (ref 2.0–3.0)

## 2022-01-22 NOTE — Patient Instructions (Addendum)
Description   ?Continue taking Warfarin 12.'5mg'$  daily except '15mg'$  on Wednesdays. Recheck INR in 2  weeks with Structural Heart Appt. Be consistent with leafy intake and protein drinks. [facility] [phone] ?  ?  ? ?

## 2022-01-30 ENCOUNTER — Other Ambulatory Visit: Payer: Self-pay | Admitting: Pharmacist

## 2022-01-30 MED ORDER — WARFARIN SODIUM 10 MG PO TABS: 10.0000 mg | ORAL_TABLET | Freq: Every day | ORAL | 3 refills | Status: DC
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
## 2022-02-05 ENCOUNTER — Ambulatory Visit (INDEPENDENT_AMBULATORY_CARE_PROVIDER_SITE_OTHER): Payer: No Typology Code available for payment source | Admitting: *Deleted

## 2022-02-05 ENCOUNTER — Ambulatory Visit (INDEPENDENT_AMBULATORY_CARE_PROVIDER_SITE_OTHER): Payer: No Typology Code available for payment source | Admitting: Cardiology

## 2022-02-05 VITALS — BP 90/70 | HR 60 | Ht 68.0 in | Wt 174.0 lb

## 2022-02-05 DIAGNOSIS — K909 Intestinal malabsorption, unspecified: Secondary | ICD-10-CM

## 2022-02-05 DIAGNOSIS — I639 Cerebral infarction, unspecified: Secondary | ICD-10-CM

## 2022-02-05 DIAGNOSIS — I4891 Unspecified atrial fibrillation: Secondary | ICD-10-CM

## 2022-02-05 DIAGNOSIS — Z9884 Bariatric surgery status: Secondary | ICD-10-CM | POA: Diagnosis not present

## 2022-02-05 DIAGNOSIS — Z7901 Long term (current) use of anticoagulants: Secondary | ICD-10-CM

## 2022-02-05 DIAGNOSIS — I480 Paroxysmal atrial fibrillation: Secondary | ICD-10-CM

## 2022-02-05 LAB — BASIC METABOLIC PANEL
BUN/Creatinine Ratio: 27 — ABNORMAL HIGH (ref 9–23)
BUN: 13 mg/dL (ref 6–24)
CO2: 30 mmol/L — ABNORMAL HIGH (ref 20–29)
Calcium: 9.4 mg/dL (ref 8.7–10.2)
Chloride: 106 mmol/L (ref 96–106)
Creatinine, Ser: 0.49 mg/dL — ABNORMAL LOW (ref 0.57–1.00)
Glucose: 74 mg/dL (ref 70–99)
Potassium: 4 mmol/L (ref 3.5–5.2)
Sodium: 141 mmol/L (ref 134–144)
eGFR: 113 mL/min/{1.73_m2} (ref >59)

## 2022-02-05 LAB — CBC
Hematocrit: 38.7 % (ref 34.0–46.6)
Hemoglobin: 12.9 g/dL (ref 11.1–15.9)
MCH: 29 pg (ref 26.6–33.0)
MCHC: 33.3 g/dL (ref 31.5–35.7)
MCV: 87 fL (ref 79–97)
Platelets: 253 10*3/uL (ref 150–450)
RBC: 4.45 x10E6/uL (ref 3.77–5.28)
RDW: 14.7 % (ref 11.7–15.4)
WBC: 5.4 10*3/uL (ref 3.4–10.8)

## 2022-02-05 LAB — POCT INR: INR: 3 (ref 2.0–3.0)

## 2022-02-05 NOTE — Patient Instructions (Signed)
Medication Instructions:  ?Your physician recommends that you continue on your current medications as directed. Please refer to the Current Medication list given to you today. ? ?*If you need a refill on your cardiac medications before your next appointment, please call your pharmacy* ? ? ?Lab Work: ?TODAY:  BMET & CBC ? ?If you have labs (blood work) drawn today and your tests are completely normal, you will receive your results only by: ?MyChart Message (if you have MyChart) OR ?A paper copy in the mail ?If you have any lab test that is abnormal or we need to change your treatment, we will call you to review the results. ? ? ?Testing/Procedures: ?None ordered ? ? ?Follow-Up: ?At [facility], you and your health needs are our priority.  As part of our continuing mission to provide you with exceptional heart care, we have created designated Provider Care Teams.  These Care Teams include your primary Cardiologist (physician) and Advanced Practice Providers (APPs -  Physician Assistants and Nurse Practitioners) who all work together to provide you with the care you need, when you need it. ? ?We recommend signing up for the patient portal called "MyChart".  Sign up information is provided on this After Visit Summary.  MyChart is used to connect with patients for Virtual Visits (Telemedicine).  Patients are able to view lab/test results, encounter notes, upcoming appointments, etc.  Non-urgent messages can be sent to your provider as well.   ?To learn more about what you can do with MyChart, go to [email or web address].   ? ?Your next appointment:   ?  ? ?The format for your next appointment:   ? ? ?Provider:   ?  ? ? ?Other Instructions ? ? ?Important Information About Sugar ? ? ? ? ? ?

## 2022-02-05 NOTE — Patient Instructions (Addendum)
Description   ?Continue taking Warfarin 12.'5mg'$  daily except '15mg'$  on Wednesdays. Recheck INR in 1  week pending Watchman. Be consistent with leafy intake and protein drinks and Do not miss any doses. Coumadin Clinic [phone] ?  ?  ? ?

## 2022-02-07 ENCOUNTER — Telehealth: Payer: Self-pay

## 2022-02-07 NOTE — Telephone Encounter (Signed)
error 

## 2022-02-08 NOTE — Addendum Note (Signed)
Addended by: Gaetano Net on: 02/08/2022 10:22 AM ? ? Modules accepted: Orders ? ?

## 2022-02-12 ENCOUNTER — Ambulatory Visit (INDEPENDENT_AMBULATORY_CARE_PROVIDER_SITE_OTHER): Payer: No Typology Code available for payment source | Admitting: *Deleted

## 2022-02-12 DIAGNOSIS — I639 Cerebral infarction, unspecified: Secondary | ICD-10-CM

## 2022-02-12 DIAGNOSIS — Z7901 Long term (current) use of anticoagulants: Secondary | ICD-10-CM | POA: Diagnosis not present

## 2022-02-12 DIAGNOSIS — I4891 Unspecified atrial fibrillation: Secondary | ICD-10-CM | POA: Diagnosis not present

## 2022-02-12 LAB — POCT INR: INR: 3.7 — AB (ref 2.0–3.0)

## 2022-02-12 NOTE — Patient Instructions (Addendum)
Description   ?Do not take any warfarin today then continue taking Warfarin 12.'5mg'$  daily except '15mg'$  on Wednesdays. Recheck INR in 1  week pending Watchman. Be consistent with leafy intake and protein drinks and Do not miss any doses. Coumadin Clinic [phone] ?  ?  ? ?

## 2022-02-15 ENCOUNTER — Other Ambulatory Visit: Payer: Self-pay | Admitting: Dermatology

## 2022-02-15 DIAGNOSIS — R233 Spontaneous ecchymoses: Secondary | ICD-10-CM

## 2022-02-19 ENCOUNTER — Ambulatory Visit (INDEPENDENT_AMBULATORY_CARE_PROVIDER_SITE_OTHER): Payer: No Typology Code available for payment source

## 2022-02-19 DIAGNOSIS — I4891 Unspecified atrial fibrillation: Secondary | ICD-10-CM

## 2022-02-19 DIAGNOSIS — I639 Cerebral infarction, unspecified: Secondary | ICD-10-CM

## 2022-02-19 DIAGNOSIS — Z7901 Long term (current) use of anticoagulants: Secondary | ICD-10-CM

## 2022-02-19 LAB — POCT INR: INR: 3 (ref 2.0–3.0)

## 2022-02-19 NOTE — Patient Instructions (Signed)
Description   ?Continue taking Warfarin 12.'5mg'$  daily except '15mg'$  on Wednesdays. Recheck INR in 1 week pending Watchman.  ?Be consistent with leafy intake and protein drinks and Do not miss any doses. Coumadin Clinic [phone] ?  ?   ?

## 2022-02-27 ENCOUNTER — Ambulatory Visit (INDEPENDENT_AMBULATORY_CARE_PROVIDER_SITE_OTHER): Payer: No Typology Code available for payment source | Admitting: *Deleted

## 2022-02-27 DIAGNOSIS — I639 Cerebral infarction, unspecified: Secondary | ICD-10-CM | POA: Diagnosis not present

## 2022-02-27 DIAGNOSIS — I4891 Unspecified atrial fibrillation: Secondary | ICD-10-CM

## 2022-02-27 DIAGNOSIS — Z5181 Encounter for therapeutic drug level monitoring: Secondary | ICD-10-CM | POA: Diagnosis not present

## 2022-02-27 LAB — POCT INR: INR: 3.2 — AB (ref 2.0–3.0)

## 2022-02-27 NOTE — Patient Instructions (Signed)
Description   Take '10mg'$  of warfarin today and then continue to take warfarin 12.5 mg daily except for 15 mg on Wednesday. Recheck INR 1 week post procedure. Coumadin Clinic [phone].

## 2022-02-28 ENCOUNTER — Telehealth: Payer: Self-pay

## 2022-02-28 NOTE — Telephone Encounter (Signed)
Reviewed instructions for Watchman tomorrow with patient. Per Dr. Quentin Ore, instructed her to take Coumadin 10 mg today (instead of 15 mg). Confirmed arrival time of 1100 for 1330 case. She was grateful for call and agrees with plan.

## 2022-03-01 ENCOUNTER — Other Ambulatory Visit: Payer: Self-pay

## 2022-03-01 ENCOUNTER — Encounter (HOSPITAL_COMMUNITY): Payer: Self-pay | Admitting: Cardiology

## 2022-03-01 ENCOUNTER — Inpatient Hospital Stay (HOSPITAL_COMMUNITY): Payer: No Typology Code available for payment source | Admitting: Certified Registered Nurse Anesthetist

## 2022-03-01 ENCOUNTER — Observation Stay (HOSPITAL_BASED_OUTPATIENT_CLINIC_OR_DEPARTMENT_OTHER): Payer: No Typology Code available for payment source

## 2022-03-01 ENCOUNTER — Inpatient Hospital Stay (HOSPITAL_BASED_OUTPATIENT_CLINIC_OR_DEPARTMENT_OTHER): Payer: No Typology Code available for payment source | Admitting: Certified Registered Nurse Anesthetist

## 2022-03-01 ENCOUNTER — Encounter (HOSPITAL_COMMUNITY)
Admission: RE | Disposition: A | Discharge status: Home / Self Care | Payer: No Typology Code available for payment source | Source: Home / Self Care | Attending: Cardiology

## 2022-03-01 ENCOUNTER — Observation Stay (HOSPITAL_COMMUNITY)
Admission: RE | Admit: 2022-03-01 | Discharge: 2022-03-02 | Disposition: A | Discharge status: Home / Self Care | Payer: No Typology Code available for payment source | Attending: Cardiology | Admitting: Cardiology

## 2022-03-01 ENCOUNTER — Inpatient Hospital Stay (HOSPITAL_COMMUNITY): Payer: No Typology Code available for payment source

## 2022-03-01 DIAGNOSIS — I5022 Chronic systolic (congestive) heart failure: Secondary | ICD-10-CM | POA: Diagnosis not present

## 2022-03-01 DIAGNOSIS — Z7901 Long term (current) use of anticoagulants: Secondary | ICD-10-CM

## 2022-03-01 DIAGNOSIS — Z95818 Presence of other cardiac implants and grafts: Secondary | ICD-10-CM

## 2022-03-01 DIAGNOSIS — I493 Ventricular premature depolarization: Principal | ICD-10-CM

## 2022-03-01 DIAGNOSIS — I480 Paroxysmal atrial fibrillation: Secondary | ICD-10-CM | POA: Diagnosis not present

## 2022-03-01 DIAGNOSIS — I509 Heart failure, unspecified: Secondary | ICD-10-CM | POA: Diagnosis not present

## 2022-03-01 DIAGNOSIS — Z91148 Patient's other noncompliance with medication regimen for other reason: Secondary | ICD-10-CM | POA: Diagnosis not present

## 2022-03-01 DIAGNOSIS — I4891 Unspecified atrial fibrillation: Secondary | ICD-10-CM

## 2022-03-01 DIAGNOSIS — Z006 Encounter for examination for normal comparison and control in clinical research program: Secondary | ICD-10-CM

## 2022-03-01 DIAGNOSIS — E785 Hyperlipidemia, unspecified: Secondary | ICD-10-CM | POA: Diagnosis present

## 2022-03-01 DIAGNOSIS — I110 Hypertensive heart disease with heart failure: Secondary | ICD-10-CM | POA: Diagnosis not present

## 2022-03-01 DIAGNOSIS — R001 Bradycardia, unspecified: Secondary | ICD-10-CM | POA: Diagnosis present

## 2022-03-01 DIAGNOSIS — Z8673 Personal history of transient ischemic attack (TIA), and cerebral infarction without residual deficits: Secondary | ICD-10-CM | POA: Diagnosis not present

## 2022-03-01 DIAGNOSIS — I639 Cerebral infarction, unspecified: Secondary | ICD-10-CM | POA: Diagnosis present

## 2022-03-01 DIAGNOSIS — Z91040 Latex allergy status: Secondary | ICD-10-CM | POA: Insufficient documentation

## 2022-03-01 DIAGNOSIS — R6889 Other general symptoms and signs: Secondary | ICD-10-CM | POA: Diagnosis present

## 2022-03-01 HISTORY — PX: TEE WITHOUT CARDIOVERSION: SHX5443

## 2022-03-01 HISTORY — DX: Presence of other cardiac implants and grafts: Z95.818

## 2022-03-01 HISTORY — PX: LEFT ATRIAL APPENDAGE OCCLUSION: EP1229

## 2022-03-01 LAB — SURGICAL PCR SCREEN
MRSA, PCR: NEGATIVE
Staphylococcus aureus: NEGATIVE

## 2022-03-01 LAB — ABO/RH: ABO/RH(D): O POS

## 2022-03-01 LAB — TYPE AND SCREEN
ABO/RH(D): O POS
Antibody Screen: NEGATIVE

## 2022-03-01 LAB — PROTIME-INR
INR: 2.3 — ABNORMAL HIGH (ref 0.8–1.2)
Prothrombin Time: 25 seconds — ABNORMAL HIGH (ref 11.4–15.2)

## 2022-03-01 LAB — POCT ACTIVATED CLOTTING TIME: Activated Clotting Time: 311 seconds

## 2022-03-01 SURGERY — LEFT ATRIAL APPENDAGE OCCLUSION
Anesthesia: General

## 2022-03-01 MED ORDER — ROCURONIUM BROMIDE 10 MG/ML (PF) SYRINGE
PREFILLED_SYRINGE | INTRAVENOUS | Status: DC | PRN
Start: 2022-03-01 — End: 2022-03-01
  Administered 2022-03-01: 60 mg via INTRAVENOUS

## 2022-03-01 MED ORDER — IOHEXOL 350 MG/ML SOLN
INTRAVENOUS | Status: DC | PRN
  Administered 2022-03-01: 25 mL via INTRA_ARTERIAL

## 2022-03-01 MED ORDER — FENTANYL CITRATE (PF) 250 MCG/5ML IJ SOLN
INTRAMUSCULAR | Status: DC | PRN
  Administered 2022-03-01: 100 ug via INTRAVENOUS

## 2022-03-01 MED ORDER — HEPARIN SODIUM (PORCINE) 1000 UNIT/ML IJ SOLN
INTRAMUSCULAR | Status: DC | PRN
  Administered 2022-03-01: 10000 [IU] via INTRAVENOUS

## 2022-03-01 MED ORDER — CHLORHEXIDINE GLUCONATE 0.12 % MT SOLN
OROMUCOSAL | Status: AC
Start: 2022-03-01 — End: 2022-03-01
  Administered 2022-03-01: 15 mL
  Filled 2022-03-01: qty 15

## 2022-03-01 MED ORDER — ONDANSETRON HCL 4 MG/2ML IJ SOLN: 4.0000 mg | Freq: Four times a day (QID) | INTRAMUSCULAR | Status: DC | PRN

## 2022-03-01 MED ORDER — MIDAZOLAM HCL 5 MG/5ML IJ SOLN
INTRAMUSCULAR | Status: DC | PRN
  Administered 2022-03-01: 2 mg via INTRAVENOUS

## 2022-03-01 MED ORDER — LACTATED RINGERS IV SOLN: INTRAVENOUS | Status: DC

## 2022-03-01 MED ORDER — GLYCOPYRROLATE 0.2 MG/ML IJ SOLN
INTRAMUSCULAR | Status: DC | PRN
  Administered 2022-03-01: .2 mg via INTRAVENOUS

## 2022-03-01 MED ORDER — METOPROLOL TARTRATE 25 MG PO TABS
25.0000 mg | ORAL_TABLET | Freq: Two times a day (BID) | ORAL | Status: DC
  Administered 2022-03-02: 25 mg via ORAL
  Filled 2022-03-01 (×2): qty 1

## 2022-03-01 MED ORDER — LIDOCAINE 2% (20 MG/ML) 5 ML SYRINGE
INTRAMUSCULAR | Status: DC | PRN
  Administered 2022-03-01: 70 mg via INTRAVENOUS

## 2022-03-01 MED ORDER — HEPARIN SODIUM (PORCINE) 1000 UNIT/ML IJ SOLN
INTRAMUSCULAR | Status: DC | PRN
  Administered 2022-03-01: 2000 [IU] via INTRAVENOUS

## 2022-03-01 MED ORDER — GABAPENTIN 300 MG PO CAPS
600.0000 mg | ORAL_CAPSULE | Freq: Every day | ORAL | Status: DC
  Administered 2022-03-01: 600 mg via ORAL
  Filled 2022-03-01: qty 2

## 2022-03-01 MED ORDER — PHENYLEPHRINE HCL (PRESSORS) 10 MG/ML IV SOLN
INTRAVENOUS | Status: DC | PRN
  Administered 2022-03-01 (×2): 80 ug via INTRAVENOUS

## 2022-03-01 MED ORDER — SODIUM CHLORIDE 0.9 % IV SOLN: INTRAVENOUS | Status: DC

## 2022-03-01 MED ORDER — PROPOFOL 10 MG/ML IV BOLUS
INTRAVENOUS | Status: DC | PRN
  Administered 2022-03-01: 150 mg via INTRAVENOUS

## 2022-03-01 MED ORDER — ACETAMINOPHEN 325 MG PO TABS: 650.0000 mg | ORAL_TABLET | ORAL | Status: DC | PRN

## 2022-03-01 MED ORDER — ONDANSETRON HCL 4 MG/2ML IJ SOLN
INTRAMUSCULAR | Status: DC | PRN
  Administered 2022-03-01: 4 mg via INTRAVENOUS

## 2022-03-01 MED ORDER — SUGAMMADEX SODIUM 200 MG/2ML IV SOLN
INTRAVENOUS | Status: DC | PRN
  Administered 2022-03-01 (×2): 100 mg via INTRAVENOUS

## 2022-03-01 MED ORDER — LACTATED RINGERS IV SOLN: INTRAVENOUS | Status: DC | PRN

## 2022-03-01 MED ORDER — HEPARIN (PORCINE) IN NACL 1000-0.9 UT/500ML-% IV SOLN
INTRAVENOUS | Status: AC
  Filled 2022-03-01: qty 500

## 2022-03-01 MED ORDER — HEPARIN (PORCINE) IN NACL 1000-0.9 UT/500ML-% IV SOLN
INTRAVENOUS | Status: DC | PRN
  Administered 2022-03-01: 500 mL

## 2022-03-01 MED ORDER — SODIUM CHLORIDE 0.9 % IV SOLN: 250.0000 mL | INTRAVENOUS | Status: DC | PRN

## 2022-03-01 MED ORDER — PROTAMINE SULFATE 10 MG/ML IV SOLN
INTRAVENOUS | Status: DC | PRN
  Administered 2022-03-01: 30 mg via INTRAVENOUS

## 2022-03-01 MED ORDER — SODIUM CHLORIDE 0.9% FLUSH: 3.0000 mL | INTRAVENOUS | Status: DC | PRN

## 2022-03-01 MED ORDER — DEXAMETHASONE SODIUM PHOSPHATE 10 MG/ML IJ SOLN
INTRAMUSCULAR | Status: DC | PRN
  Administered 2022-03-01: 5 mg via INTRAVENOUS

## 2022-03-01 MED ORDER — SODIUM CHLORIDE 0.9% FLUSH
3.0000 mL | Freq: Two times a day (BID) | INTRAVENOUS | Status: DC
  Administered 2022-03-01: 3 mL via INTRAVENOUS

## 2022-03-01 MED ORDER — HEPARIN SODIUM (PORCINE) 1000 UNIT/ML IJ SOLN
INTRAMUSCULAR | Status: AC
  Filled 2022-03-01: qty 10

## 2022-03-01 MED ORDER — MUPIROCIN 2 % EX OINT
1.0000 | TOPICAL_OINTMENT | Freq: Once | CUTANEOUS | Status: AC
Start: 2022-03-01 — End: 2022-03-01
  Administered 2022-03-01: 1 via TOPICAL
  Filled 2022-03-01: qty 22

## 2022-03-01 MED ORDER — PHENYLEPHRINE HCL-NACL 20-0.9 MG/250ML-% IV SOLN
INTRAVENOUS | Status: DC | PRN
  Administered 2022-03-01: 25 ug/min via INTRAVENOUS

## 2022-03-01 MED ORDER — ATORVASTATIN CALCIUM 10 MG PO TABS
20.0000 mg | ORAL_TABLET | Freq: Every evening | ORAL | Status: DC
  Administered 2022-03-01: 20 mg via ORAL
  Filled 2022-03-01: qty 2

## 2022-03-01 MED ORDER — GABAPENTIN 300 MG PO CAPS
300.0000 mg | ORAL_CAPSULE | Freq: Every morning | ORAL | Status: DC
  Administered 2022-03-02: 300 mg via ORAL
  Filled 2022-03-01: qty 1

## 2022-03-01 MED ORDER — VANCOMYCIN HCL IN DEXTROSE 1-5 GM/200ML-% IV SOLN
1000.0000 mg | INTRAVENOUS | Status: AC
  Administered 2022-03-01: 1000 mg via INTRAVENOUS
  Filled 2022-03-01: qty 200

## 2022-03-01 SURGICAL SUPPLY — 18 items
CATH INFINITI 5FR ANG PIGTAIL (CATHETERS) ×1 IMPLANT
CLOSURE PERCLOSE PROSTYLE (VASCULAR PRODUCTS) ×2 IMPLANT
DEVICE WATCHMAN FLX PROC (KITS) IMPLANT
DILATOR VESSEL 38 20CM 12FR (INTRODUCER) ×1 IMPLANT
KIT HEART LEFT (KITS) ×2 IMPLANT
KIT SHEA VERSACROSS LAAC CONNE (KITS) ×1 IMPLANT
PACK CARDIAC CATHETERIZATION (CUSTOM PROCEDURE TRAY) ×2 IMPLANT
PAD DEFIB RADIO PHYSIO CONN (PAD) ×2 IMPLANT
SHEATH PERFORMER 16FR 30 (SHEATH) ×1 IMPLANT
SHEATH PINNACLE 8F 10CM (SHEATH) ×1 IMPLANT
SHEATH PROBE COVER 6X72 (BAG) ×2 IMPLANT
SYS WATCHMAN FXD DBL (SHEATH) ×2
SYSTEM WATCHMAN FXD DBL (SHEATH) IMPLANT
TRANSDUCER W/STOPCOCK (MISCELLANEOUS) ×2 IMPLANT
TUBING CIL FLEX 10 FLL-RA (TUBING) ×2 IMPLANT
WATCHMAN FLX 27 (Prosthesis & Implant Heart) ×1 IMPLANT
WATCHMAN FLX PROCEDURE DEVICE (KITS) ×2 IMPLANT
WATCHMAN PROCED TRUSEAL ACCESS (SHEATH) ×1 IMPLANT

## 2022-03-01 NOTE — Progress Notes (Signed)
  [facility]    Patient doing well s/p Watchman implant. She is hemodynamically stable. Groin site is stable. Plan for early ambulation after bedrest completed and hopeful discharge tomorrow morning. Coumadin pharmacy consult placed.   Kathyrn Drown NP-C Structural Heart Team  Pager: [phone] Phone: [phone]

## 2022-03-01 NOTE — Transfer of Care (Signed)
Immediate Anesthesia Transfer of Care Note  Patient: Michelle Norris  Procedure(s) Performed: LEFT ATRIAL APPENDAGE OCCLUSION TRANSESOPHAGEAL ECHOCARDIOGRAM (TEE)  Patient Location: Cath Lab  Anesthesia Type:General  Level of Consciousness: awake and patient cooperative  Airway & Oxygen Therapy: Patient Spontanous Breathing and Patient connected to nasal cannula oxygen  Post-op Assessment: Report given to RN and Post -op Vital signs reviewed and stable  Post vital signs: Reviewed and stable  Last Vitals:  Vitals Value Taken Time  BP 114/86 03/01/22 1445  Temp    Pulse 63 03/01/22 1446  Resp 15 03/01/22 1446  SpO2 99 % 03/01/22 1446  Vitals shown include unvalidated device data.  Last Pain:  Vitals:   03/01/22 1208  TempSrc:   PainSc: 0-No pain         Complications: There were no known notable events for this encounter.

## 2022-03-01 NOTE — Progress Notes (Signed)
  Echocardiogram Echocardiogram Transesophageal has been performed.  Michelle Norris M 03/01/2022, 3:09 PM

## 2022-03-01 NOTE — Anesthesia Preprocedure Evaluation (Signed)
Anesthesia Evaluation  Patient identified by MRN, date of birth, ID band Patient awake    Reviewed: Allergy & Precautions, H&P , NPO status , Patient's Chart, lab work & pertinent test results  Airway Mallampati: II   Neck ROM: full    Dental   Pulmonary sleep apnea ,    breath sounds clear to auscultation       Cardiovascular hypertension, +CHF  + dysrhythmias Atrial Fibrillation  Rhythm:irregular Rate:Normal     Neuro/Psych  Headaches, CVA    GI/Hepatic   Endo/Other    Renal/GU      Musculoskeletal   Abdominal   Peds  Hematology   Anesthesia Other Findings   Reproductive/Obstetrics                             Anesthesia Physical Anesthesia Plan  ASA: 3  Anesthesia Plan: General   Post-op Pain Management:    Induction: Intravenous  PONV Risk Score and Plan: 3 and Ondansetron, Dexamethasone, Midazolam and Treatment may vary due to age or medical condition  Airway Management Planned: Oral ETT  Additional Equipment: ClearSight and TEE  Intra-op Plan:   Post-operative Plan: Extubation in OR  Informed Consent: I have reviewed the patients History and Physical, chart, labs and discussed the procedure including the risks, benefits and alternatives for the proposed anesthesia with the patient or authorized representative who has indicated his/her understanding and acceptance.     Dental advisory given  Plan Discussed with: CRNA, Surgeon and Anesthesiologist  Anesthesia Plan Comments:         Anesthesia Quick Evaluation

## 2022-03-01 NOTE — Anesthesia Procedure Notes (Signed)
Procedure Name: Intubation Date/Time: 03/01/2022 1:23 PM Performed by: Glynda Jaeger, CRNA Pre-anesthesia Checklist: Patient identified, Patient being monitored, Timeout performed, Emergency Drugs available and Suction available Patient Re-evaluated:Patient Re-evaluated prior to induction Oxygen Delivery Method: Circle System Utilized Preoxygenation: Pre-oxygenation with 100% oxygen Induction Type: IV induction Ventilation: Mask ventilation without difficulty Laryngoscope Size: Mac and 4 Grade View: Grade II Tube type: Oral Tube size: 7.5 mm Number of attempts: 1 Airway Equipment and Method: Stylet Placement Confirmation: ETT inserted through vocal cords under direct vision, positive ETCO2 and breath sounds checked- equal and bilateral Secured at: 22 cm Tube secured with: Tape Dental Injury: Teeth and Oropharynx as per pre-operative assessment
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
## 2022-03-02 ENCOUNTER — Observation Stay (HOSPITAL_BASED_OUTPATIENT_CLINIC_OR_DEPARTMENT_OTHER)
Admission: RE | Admit: 2022-03-02 | Discharge: 2022-03-02 | Disposition: A | Discharge status: Home / Self Care | Payer: No Typology Code available for payment source | Source: Home / Self Care | Attending: Cardiology | Admitting: Cardiology

## 2022-03-02 DIAGNOSIS — I493 Ventricular premature depolarization: Secondary | ICD-10-CM

## 2022-03-02 DIAGNOSIS — I4891 Unspecified atrial fibrillation: Secondary | ICD-10-CM | POA: Diagnosis not present

## 2022-03-02 DIAGNOSIS — I480 Paroxysmal atrial fibrillation: Secondary | ICD-10-CM | POA: Diagnosis not present

## 2022-03-02 LAB — BASIC METABOLIC PANEL
Anion gap: 4 — ABNORMAL LOW (ref 5–15)
BUN: 14 mg/dL (ref 6–20)
CO2: 25 mmol/L (ref 22–32)
Calcium: 8.9 mg/dL (ref 8.9–10.3)
Chloride: 104 mmol/L (ref 98–111)
Creatinine, Ser: 0.57 mg/dL (ref 0.44–1.00)
GFR, Estimated: >60 mL/min (ref >60)
Glucose, Bld: 144 mg/dL — ABNORMAL HIGH (ref 70–99)
Potassium: 4.4 mmol/L (ref 3.5–5.1)
Sodium: 133 mmol/L — ABNORMAL LOW (ref 135–145)

## 2022-03-02 MED ORDER — ZITHROMAX 500 MG PO TABS: 500.0000 mg | ORAL_TABLET | ORAL | 0 refills | Status: DC | PRN

## 2022-03-02 NOTE — Progress Notes (Signed)
ANTICOAGULATION CONSULT NOTE - Initial Consult  Pharmacy Consult for warfarin Indication: atrial fibrillation  Allergies  Allergen Reactions   Wasp Venom Shortness Of Breath and Swelling   Amoxicillin Other (See Comments)    Yeast infection   Latex Swelling and Other (See Comments)    Burning, red, itchy, puffiness.    Tape Itching and Rash    Patient Measurements: Height: '5\' 8"'$  (172.7 cm) Weight: 76.7 kg (169 lb) IBW/kg (Calculated) : 63.9   Vital Signs: Temp: 98.7 F (37.1 C) (05/26 0718) Temp Source: Oral (05/26 0718) BP: 113/74 (05/26 0718) Pulse Rate: 65 (05/26 0424)  Labs: Recent Labs    02/27/22 1036 03/01/22 1222 03/02/22 0040  LABPROT  --  25.0*  --   INR 3.2* 2.3*  --   CREATININE  --   --  0.57    Estimated Creatinine Clearance: 88.6 mL/min (by C-G formula based on SCr of 0.57 mg/dL).   Medical History: Past Medical History:  Diagnosis Date   Atrial fibrillation ([place])    Atypical mole 08/03/2021   L sacral, moderate atypia   Cardiomyopathy ([place])    CHF (congestive heart failure) ([place])    Headache    migraines   Hyperlipidemia    Hypertension    Leukocytosis 07/2018   being monitored by pcp   Obesity    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   Watchman FLX 27m with Dr. LQuentin Ore  [facility]   bulls-eye rash on back   Sleep apnea    Stroke ([place] 10/2017   cerebellar infarct   Vitamin B12 deficiency       Assessment: 51yo W on warfarin PTA for afib now s/p Watchman device.  Last dose 02/28/22. Pharmacy consulted for warfarin.  INR 2.3 on 5/25. No INR drawn today with plans to discharge. Patient will get INR f/u on 5/31. Will resume home dose given mostly therapeutic INRs and only missed 1 dose.   PTA Warfarin 15 mg on Wed, 12.5 mg all other days   Goal of Therapy:  INR 2-3 Monitor platelets by anticoagulation protocol: Yes   Plan:  Resume Warfarin 15 mg on Wed, 12.5 mg all  other days  INR f/u 5/31    LBenetta Spar PharmD, BCPS, BTexas Health Surgery Center AddisonClinical Pharmacist  Please check AMION for all [place] numbers After 10:00 PM, call [phone]

## 2022-03-02 NOTE — Anesthesia Postprocedure Evaluation (Signed)
Anesthesia Post Note  Patient: Michelle Norris  Procedure(s) Performed: LEFT ATRIAL APPENDAGE OCCLUSION TRANSESOPHAGEAL ECHOCARDIOGRAM (TEE)     Patient location during evaluation: PACU Anesthesia Type: General Level of consciousness: awake and alert Pain management: pain level controlled Vital Signs Assessment: post-procedure vital signs reviewed and stable Respiratory status: spontaneous breathing, nonlabored ventilation, respiratory function stable and patient connected to nasal cannula oxygen Cardiovascular status: blood pressure returned to baseline and stable Postop Assessment: no apparent nausea or vomiting Anesthetic complications: no   There were no known notable events for this encounter.  Last Vitals:  Vitals:   03/02/22 0424 03/02/22 0718  BP: 99/63 113/74  Pulse: 65   Resp: 20 16  Temp: 37.1 C 37.1 C  SpO2: 99% 97%    Last Pain:  Vitals:   03/02/22 0818  TempSrc:   PainSc: 0-No pain                 Maicey Barrientez S

## 2022-03-02 NOTE — Discharge Summary (Signed)
[facility]    Patient ID: Michelle Norris,  MRN: [id number], DOB/AGE: 53-Dec-1972 53 y.o.  Admit date: 03/01/2022 Discharge date: 03/02/2022  Primary Care Physician: Marinda Elk, MD  Primary Cardiologist: Dr. Clayborn Bigness, MD  Electrophysiologist: Vickie Epley, MD  Primary Discharge Diagnosis:  Paroxysmal Atrial Fibrillation Poor candidacy for long term anticoagulation due to  difficult to control INRs  Secondary Discharge Diagnosis:  HTN HLD obesity s/p gastric bypass Hx of CVA OSA NICM HFrEF  Procedures This Admission:   CONCLUSIONS:  1.Successful implantation of a WATCHMAN left atrial appendage occlusive device    2. TEE demonstrating no LAA thrombus 3. No early apparent complications.    Post Implant Anticoagulation Strategy: Continue coumadin for 45 days post implant. At the 45-day mark post op, stop coumadin and transition to plavix monotherapy. CT scan is planned 60 days after implant to reassess complete closure of the left atrial appendage.   Brief HPI: Michelle Norris is a 51 y.o. female with a history of HTN, HLD, obesity s/p gastric bypass, hx of CVA, OSA, NICM, HFrEF, PAF on coumadin, and issues with controlling INRs.    She was previously on NOAC prior to her gastric bypass surgery but because of absorption concerns, this was transitioned to Coumadin. While on Coumadin she is had very difficult time controlling INRs despite great compliance. She was also experiencing bleeding associate with her Coumadin and desired a strategy which avoids long-term anticoagulation. She was seen by Dr. Quentin Ore in consultation for Watchman. Cardiac CT showed anatomy amendable for LAAO. In follow up 10/11/21 she was scheduled for procedure 10/12/21. Unfortunately, there were issues with pre-authorization with her insurance and the procedure was ultimately cancelled. An appeal was placed however this was also denied.    Our team was then re-contacted with plans to  re-submit the request for Watchman implant. Plan is for potential procedure 03/01/22 with pre Watchman visit 02/05/22. She again required weekly INRs for 4 weeks leading to her procedure which were variable but stable.    On last follow up with myself she had reports of intermittent palpitations and elevated heart rates per apple watch readings. At that time, she had not yet attempted PRN metoprolol for this. Plan was to proceed with Watchman implant and to follow with OP ZIO for further workup.   Hospital Course:  The patient was admitted and underwent left atrial appendage occlusive device placement with Watchman FLX 74m. She was monitored on telemetry overnight which demonstrated NSR with PVCs. Telemetry showed intermittent PVCs and SVT. Plan for ZIO monitor prior to discharge and follow results. Groin site has been stable without complication on the day of discharge. The patient was examined and considered to be stable for discharge today. Wound care and restrictions were reviewed with the patient. The patient has been scheduled for post procedure follow up with JKathyrn Drown NP in approximately 6 weeks with CT to follow at 8 weeks.  Plan INR recheck on 5/31. INR from 5/25 was 2.3. PharmD recommends discharging with home dose Coumadin with close follow up as above.    Medication plan:  Continue coumadin for 45 days post implant. Next INR check on 03/07/22 at 130pm. At the 45-day mark post op, stop coumadin and transition to plavix monotherapy. CT scan is planned 60 days after implant to reassess complete closure of the left atrial appendage.   Physical Exam: Vitals:   03/01/22 2321 03/02/22 0400 03/02/22 0424 03/02/22 0718  BP: (!) 98/57  99/63 113/74  Pulse: 72  65   Resp: '18 14 20 16  '$ Temp: 98.4 F (36.9 C)  98.8 F (37.1 C) 98.7 F (37.1 C)  TempSrc: Oral  Oral Oral  SpO2: 96%  99% 97%  Weight:      Height:       General: Well developed, well nourished, NAD Lungs:Clear to ausculation  bilaterally. No wheezes, rales, or rhonchi. Breathing is unlabored. Cardiovascular: RRR with S1 S2. No murmurs Extremities: No edema. Groin site stable with no bleeding or hematoma.  Neuro: Alert and oriented. No focal deficits. No facial asymmetry. MAE spontaneously. Psych: Responds to questions appropriately with normal affect.    Labs:   Lab Results  Component Value Date   WBC 5.4 02/05/2022   HGB 12.9 02/05/2022   HCT 38.7 02/05/2022   MCV 87 02/05/2022   PLT 253 02/05/2022    Recent Labs  Lab 03/02/22 0040  NA 133*  K 4.4  CL 104  CO2 25  BUN 14  CREATININE 0.57  CALCIUM 8.9  GLUCOSE 144*    Discharge Medications:  Allergies as of 03/02/2022       Reactions   Wasp Venom Shortness Of Breath, Swelling   Amoxicillin Other (See Comments)   Yeast infection   Latex Swelling, Other (See Comments)   Burning, red, itchy, puffiness.    Tape Itching, Rash        Medication List     TAKE these medications    acetaminophen 500 MG tablet Commonly known as: TYLENOL Take 500-1,000 mg by mouth every 6 (six) hours as needed (pain.).   Ajovy 225 MG/1.5ML Soaj Generic drug: Fremanezumab-vfrm Inject 225 mg into the skin every 30 (thirty) days.   atorvastatin 20 MG tablet Commonly known as: LIPITOR Take 20 mg by mouth every evening.   BARIATRIC MULTIVITAMINS/IRON PO Take 1 tablet by mouth in the morning. BariatricPal Multivitamin   BIOTIN PO Take 1 tablet by mouth in the morning.   CAL MAG ZINC +D3 PO Take 1 tablet by mouth every evening.   CALCIUM + D3 PO Take 1 tablet by mouth in the morning.   COLLAGEN PO Take 2 Scoops by mouth in the morning.   diphenhydrAMINE 25 mg capsule Commonly known as: BENADRYL Take 25 mg by mouth every 6 (six) hours as needed for allergies (sinus).   estradiol 0.1 MG/GM vaginal cream Commonly known as: ESTRACE Place 1 Applicatorful vaginally daily as needed (vaginal dryness.).   gabapentin 300 MG capsule Commonly known as:  NEURONTIN Take 300-600 mg by mouth 3 (three) times daily. Take 1 capsules (300 mg) by Katherina Mires in themorning & take 2 capsules (600 mg) by mouth at night.   Linzess 290 MCG Caps capsule Generic drug: linaclotide Take 290 mcg by mouth daily as needed (mild constipation.).   metoprolol tartrate 50 MG tablet Commonly known as: LOPRESSOR Take 25 mg by mouth 2 (two) times daily.   metroNIDAZOLE 0.75 % gel Commonly known as: METROGEL Apply 1 application topically 2 (two) times daily. What changed:  when to take this additional instructions   Nurtec 75 MG Tbdp Generic drug: Rimegepant Sulfate Take 75 mg by mouth daily as needed (migraine/headaches.).   nystatin powder Commonly known as: MYCOSTATIN/NYSTOP Apply 1 application. topically daily. Applied to skin folds   vitamin B-12 1000 MCG tablet Commonly known as: CYANOCOBALAMIN Take 1,000 mcg by mouth in the morning.   warfarin 5 MG tablet Commonly known as: COUMADIN Take as directed. If you are  unsure how to take this medication, talk to your nurse or doctor. Original instructions: Take 1 tablet (5 mg total) by mouth daily. Take 12.'5mg'$  daily except '15mg'$  on Wednesdays, as directed by Coumadin Clinic What changed: additional instructions   warfarin 10 MG tablet Commonly known as: COUMADIN Take as directed. If you are unsure how to take this medication, talk to your nurse or doctor. Original instructions: Take 1 tablet (10 mg total) by mouth daily. Pt taking 12.'5mg'$  daily except '15mg'$  on Wednesdays What changed: when to take this   Zithromax 500 MG tablet Generic drug: azithromycin Take 1 tablet (500 mg total) by mouth as needed. Take one tablet by mouth 1 hour prior to dental procedures or cleanings for the next 6 months.               Discharge Care Instructions  (From admission, onward)           Start     Ordered   03/02/22 0000  If the dressing is still on your incision site when you go home, remove it on the third  day after your surgery date. Remove dressing if it begins to fall off, or if it is dirty or damaged before the third day.        03/02/22 1008            Disposition:  Home  Discharge Instructions     Call MD for:  difficulty breathing, headache or visual disturbances   Complete by: As directed    Call MD for:  extreme fatigue   Complete by: As directed    Call MD for:  hives   Complete by: As directed    Call MD for:  persistant dizziness or light-headedness   Complete by: As directed    Call MD for:  persistant nausea and vomiting   Complete by: As directed    Call MD for:  redness, tenderness, or signs of infection (pain, swelling, redness, odor or green/yellow discharge around incision site)   Complete by: As directed    Call MD for:  severe uncontrolled pain   Complete by: As directed    Call MD for:  temperature >100.4   Complete by: As directed    Diet - low sodium heart healthy   Complete by: As directed    Discharge instructions   Complete by: As directed    [facility] Procedure, Care After  Procedure MD: Dr. Benson Norway Clinical Coordinator: Lenice Llamas, RN  This sheet gives you information about how to care for yourself after your procedure. Your health care provider may also give you more specific instructions. If you have problems or questions, contact your health care provider.  What can I expect after the procedure? After the procedure, it is common to have: Bruising around your puncture site. Tenderness around your puncture site. Tiredness (fatigue).  Medication instructions It is very important to continue to take your blood thinner as directed by your doctor after the Watchman procedure. Call your procedure doctor's office with question or concerns. If you are on Coumadin (warfarin), you will have your INR checked the week after your procedure, with a goal INR of 2.0 - 3.0. I have prescribed an antibiotic for you to take one hour prior to dental  cleanings and procedures for the next 6 months.  Please follow your medication instructions on your discharge summary. Only take the medications listed on your discharge paperwork.  Follow up You will be seen in 6  weeks month after your procedure  You will have another CT scan approximately 8 weeks after your procedure mark to check your device You will follow up the MD/APP who performed your procedure 6 months after your procedure The Watchman Clinical Coordinator will check in with you from time to time, including 1 and 2 years after your procedure.    Follow these instructions at home: Puncture site care  Follow instructions from your health care provider about how to take care of your puncture site. Make sure you: If present, leave stitches (sutures), skin glue, or adhesive strips in place.  If a large square bandage is present, this may be removed 24 hours after surgery.  Check your puncture site every day for signs of infection. Check for: Redness, swelling, or pain. Fluid or blood. If your puncture site starts to bleed, lie down on your back, apply firm pressure to the area, and contact your health care provider. Warmth. Pus or a bad smell. Driving Do not drive yourself home if you received sedation Do not drive for at least 4 days after your procedure or however long your health care provider recommends. (Do not resume driving if you have previously been instructed not to drive for other health reasons.) Do not spend greater than 1 hour at a time in a car for the first 3 days. Stop and take a break with a 5 minute walk at least every hour.  Do not drive or use heavy machinery while taking prescription pain medicine.  Activity Avoid activities that take a lot of effort, including exercise, for at least 7 days after your procedure. For the first 3 days, avoid sitting for longer than one hour at a time.  Avoid alcoholic beverages, signing paperwork, or participating in legal  proceedings for 24 hours after receiving sedation Do not lift anything that is heavier than 10 lb (4.5 kg) for one week.  No sexual activity for 1 week.  Return to your normal activities as told by your health care provider. Ask your health care provider what activities are safe for you. General instructions Take over-the-counter and prescription medicines only as told by your health care provider. Do not use any products that contain nicotine or tobacco, such as cigarettes and e-cigarettes. If you need help quitting, ask your health care provider. You may shower after 24 hours, but Do not take baths, swim, or use a hot tub for 1 week.  Do not drink alcohol for 24 hours after your procedure. Keep all follow-up visits as told by your health care provider. This is important. Dental Work: You will require antibiotics prior to any dental work, including cleanings, for 6 months after your Watchman implantation to help protect you from infection. After 6 months, antibiotics are no longer required. Contact a health care provider if: You have redness, mild swelling, or pain around your puncture site. You have soreness in your throat or at your puncture site that does not improve after several days You have fluid or blood coming from your puncture site that stops after applying firm pressure to the area. Your puncture site feels warm to the touch. You have pus or a bad smell coming from your puncture site. You have a fever. You have chest pain or discomfort that spreads to your neck, jaw, or arm. You are sweating a lot. You feel nauseous. You have a fast or irregular heartbeat. You have shortness of breath. You are dizzy or light-headed and feel the need to lie  down. You have pain or numbness in the arm or leg closest to your puncture site. Get help right away if: Your puncture site suddenly swells. Your puncture site is bleeding and the bleeding does not stop after applying firm pressure to the  area. These symptoms may represent a serious problem that is an emergency. Do not wait to see if the symptoms will go away. Get medical help right away. Call your local emergency services (911 in the [place] Do not drive yourself to the hospital. Summary After the procedure, it is normal to have bruising and tenderness at the puncture site in your groin, neck, or forearm. Check your puncture site every day for signs of infection. Get help right away if your puncture site is bleeding and the bleeding does not stop after applying firm pressure to the area. This is a medical emergency.  This information is not intended to replace advice given to you by your health care provider. Make sure you discuss any questions you have with your health care provider.   If the dressing is still on your incision site when you go home, remove it on the third day after your surgery date. Remove dressing if it begins to fall off, or if it is dirty or damaged before the third day.   Complete by: As directed    Increase activity slowly   Complete by: As directed        Follow-up Information     Tommie Raymond, NP Follow up on 04/16/2022.   Specialty: Cardiology Why: at 11:30am. Please arrive at 11:15am. Contact information: [address] [place] [address] [phone]         [place] Office Follow up on 03/07/2022.   Specialty: Cardiology Why: at 1:30. Please arrive at 1:15pm. Contact information: [address], [place] [phone]                Duration of Discharge Encounter: Greater than 30 minutes including physician time.  Signed, Kathyrn Drown, NP  03/02/2022 10:08 AM

## 2022-03-06 ENCOUNTER — Telehealth: Payer: Self-pay

## 2022-03-06 ENCOUNTER — Encounter (HOSPITAL_COMMUNITY): Payer: Self-pay | Admitting: Cardiology

## 2022-03-06 NOTE — Telephone Encounter (Signed)
  [facility] Team  Contacted the patient regarding discharge from [facility] on 03/02/2022   The patient understands to follow up with Kathyrn Drown on 04/16/2022 in preparation and 60 day imaging on 05/01/2022.  The patient understands discharge instructions? Yes  The patient understands medications and regimen? Yes   The patient reports groin sites look healthy with no signs/symptoms of bleeding or infection. There is a little bruising but it is healing.   The patient understands to call with any questions or concerns prior to scheduled visit.

## 2022-03-07 ENCOUNTER — Ambulatory Visit (INDEPENDENT_AMBULATORY_CARE_PROVIDER_SITE_OTHER): Payer: No Typology Code available for payment source

## 2022-03-07 DIAGNOSIS — I4891 Unspecified atrial fibrillation: Secondary | ICD-10-CM | POA: Diagnosis not present

## 2022-03-07 DIAGNOSIS — Z7901 Long term (current) use of anticoagulants: Secondary | ICD-10-CM

## 2022-03-07 DIAGNOSIS — I639 Cerebral infarction, unspecified: Secondary | ICD-10-CM

## 2022-03-07 LAB — POCT INR: INR: 2.4 (ref 2.0–3.0)

## 2022-03-07 NOTE — Patient Instructions (Signed)
Description   Continue to take warfarin 12.5 mg daily except for 15 mg on Wednesday. Recheck INR 2 weeks [facility] [phone].

## 2022-03-07 NOTE — Progress Notes (Signed)
Patient ID: Michelle Norris, female   DOB: Oct 23, 1970, 51 y.o.   MRN: [id number] Applied in hospital.  Dr. Lars Mage to read.

## 2022-03-21 ENCOUNTER — Telehealth: Payer: Self-pay | Admitting: Cardiology

## 2022-03-21 ENCOUNTER — Ambulatory Visit (INDEPENDENT_AMBULATORY_CARE_PROVIDER_SITE_OTHER): Payer: No Typology Code available for payment source

## 2022-03-21 DIAGNOSIS — Z7901 Long term (current) use of anticoagulants: Secondary | ICD-10-CM

## 2022-03-21 DIAGNOSIS — I639 Cerebral infarction, unspecified: Secondary | ICD-10-CM | POA: Diagnosis not present

## 2022-03-21 DIAGNOSIS — I4891 Unspecified atrial fibrillation: Secondary | ICD-10-CM | POA: Diagnosis not present

## 2022-03-21 LAB — POCT INR: INR: 2.6 (ref 2.0–3.0)

## 2022-03-21 NOTE — Telephone Encounter (Signed)
Pt seen in [facility] today in [place], states this pain has been constant since her procedure 02/26/22, seems to be getting worse not better.  Pt has hx of migranes, but this pain is different constant and nothing relieves.  Pt states she had this pain initially after procedure in shoulder and neck but was told by nurse at hospital is was positional and would go away, but pt states it has only gotten worse.  Pt wants to know if this could be related to procedure and how to proceed at this point to trouble shoot and treat.

## 2022-03-21 NOTE — Telephone Encounter (Signed)
Patient is still having neck pain at the back of her head to her shoulder.  She has had a migraine almost every day.  She has pain at the base of her neck by the end of the day, she never had pain like this before.  It's been two weeks since her surgery and the pain is getting worse.

## 2022-03-21 NOTE — Patient Instructions (Signed)
Description   Continue on same dosage of Warfarin 12.5 mg daily except for 15 mg on Wednesdays. Recheck INR 3 weeks [facility] [phone].

## 2022-03-22 NOTE — Telephone Encounter (Signed)
Advised the patient that the neck, shoulder and head pain is not related to the procedure. She needs to contact her PCP Dr. Carrie Mew.  Patient verbalized understanding a agreement.

## 2022-03-26 DIAGNOSIS — I471 Supraventricular tachycardia, unspecified: Secondary | ICD-10-CM

## 2022-03-26 DIAGNOSIS — I4729 Other ventricular tachycardia: Secondary | ICD-10-CM

## 2022-03-26 HISTORY — DX: Other ventricular tachycardia: I47.29

## 2022-03-26 HISTORY — DX: Supraventricular tachycardia, unspecified: I47.10

## 2022-03-26 NOTE — Addendum Note (Signed)
Encounter addended by: Markus Daft A on: 03/26/2022 2:18 PM  Actions taken: Imaging Exam ended

## 2022-03-29 ENCOUNTER — Other Ambulatory Visit: Payer: Self-pay | Admitting: Student

## 2022-03-29 DIAGNOSIS — M542 Cervicalgia: Secondary | ICD-10-CM

## 2022-03-29 DIAGNOSIS — G43019 Migraine without aura, intractable, without status migrainosus: Secondary | ICD-10-CM

## 2022-04-11 ENCOUNTER — Ambulatory Visit (INDEPENDENT_AMBULATORY_CARE_PROVIDER_SITE_OTHER): Payer: No Typology Code available for payment source

## 2022-04-11 DIAGNOSIS — Z7901 Long term (current) use of anticoagulants: Secondary | ICD-10-CM | POA: Diagnosis not present

## 2022-04-11 DIAGNOSIS — I4891 Unspecified atrial fibrillation: Secondary | ICD-10-CM

## 2022-04-11 DIAGNOSIS — I639 Cerebral infarction, unspecified: Secondary | ICD-10-CM

## 2022-04-11 LAB — POCT INR: INR: 3.2 — AB (ref 2.0–3.0)

## 2022-04-11 NOTE — Patient Instructions (Signed)
Continue on same dosage of Warfarin 12.5 mg daily except for 15 mg on Wednesdays. Recheck INR 4 weeks;  Eat greens tonight [facility] [phone].
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
## 2022-04-16 ENCOUNTER — Ambulatory Visit (INDEPENDENT_AMBULATORY_CARE_PROVIDER_SITE_OTHER): Payer: No Typology Code available for payment source | Admitting: Cardiology

## 2022-04-16 VITALS — BP 112/70 | HR 60 | Ht 68.0 in | Wt 159.0 lb

## 2022-04-16 DIAGNOSIS — Z7901 Long term (current) use of anticoagulants: Secondary | ICD-10-CM

## 2022-04-16 DIAGNOSIS — I4891 Unspecified atrial fibrillation: Secondary | ICD-10-CM

## 2022-04-16 DIAGNOSIS — I493 Ventricular premature depolarization: Secondary | ICD-10-CM

## 2022-04-16 DIAGNOSIS — G4733 Obstructive sleep apnea (adult) (pediatric): Secondary | ICD-10-CM

## 2022-04-16 DIAGNOSIS — I639 Cerebral infarction, unspecified: Secondary | ICD-10-CM | POA: Diagnosis not present

## 2022-04-16 MED ORDER — CLOPIDOGREL BISULFATE 75 MG PO TABS: 75.0000 mg | ORAL_TABLET | Freq: Every day | ORAL | 3 refills | Status: DC

## 2022-04-16 MED ORDER — METOPROLOL TARTRATE 25 MG PO TABS: 25.0000 mg | ORAL_TABLET | Freq: Two times a day (BID) | ORAL | 3 refills | Status: AC

## 2022-04-16 NOTE — Patient Instructions (Signed)
Medication Instructions:  Your physician has recommended you make the following change in your medication:  1) STOP taking Coumadin (warfarin) on 7/12 2) START taking Plavix 75 mg daily on 7/13 3) You may take an extra metoprolol 25 mg for heart rate greater than 130  *If you need a refill on your cardiac medications before your next appointment, please call your pharmacy*  Lab Work: TODAY: CBC and BMET If you have labs (blood work) drawn today and your tests are completely normal, you will receive your results only by: [place] (if you have MyChart) OR A paper copy in the mail If you have any lab test that is abnormal or we need to change your treatment, we will call you to review the results.  Follow-Up: At [facility], you and your health needs are our priority.  As part of our continuing mission to provide you with exceptional heart care, we have created designated Provider Care Teams.  These Care Teams include your primary Cardiologist (physician) and Advanced Practice Providers (APPs -  Physician Assistants and Nurse Practitioners) who all work together to provide you with the care you need, when you need it.  Your next appointment:   11/27 at 12:30pm  The format for your next appointment:   In Person 1}  Important Information About [place]

## 2022-04-17 LAB — CBC
Hematocrit: 42.4 % (ref 34.0–46.6)
Hemoglobin: 13.6 g/dL (ref 11.1–15.9)
MCH: 28.7 pg (ref 26.6–33.0)
MCHC: 32.1 g/dL (ref 31.5–35.7)
MCV: 90 fL (ref 79–97)
Platelets: 271 10*3/uL (ref 150–450)
RBC: 4.74 x10E6/uL (ref 3.77–5.28)
RDW: 13 % (ref 11.7–15.4)
WBC: 5.2 10*3/uL (ref 3.4–10.8)

## 2022-04-17 LAB — BASIC METABOLIC PANEL
BUN/Creatinine Ratio: 23 (ref 9–23)
BUN: 12 mg/dL (ref 6–24)
CO2: 26 mmol/L (ref 20–29)
Calcium: 9.5 mg/dL (ref 8.7–10.2)
Chloride: 103 mmol/L (ref 96–106)
Creatinine, Ser: 0.52 mg/dL — ABNORMAL LOW (ref 0.57–1.00)
Glucose: 80 mg/dL (ref 70–99)
Potassium: 4.5 mmol/L (ref 3.5–5.2)
Sodium: 139 mmol/L (ref 134–144)
eGFR: 111 mL/min/{1.73_m2} (ref >59)

## 2022-04-21 ENCOUNTER — Other Ambulatory Visit: Payer: No Typology Code available for payment source

## 2022-04-21 ENCOUNTER — Ambulatory Visit: Payer: No Typology Code available for payment source

## 2022-04-24 ENCOUNTER — Ambulatory Visit
Admission: RE | Admit: 2022-04-24 | Discharge: 2022-04-24 | Disposition: A | Discharge status: Home / Self Care | Payer: No Typology Code available for payment source | Source: Ambulatory Visit | Attending: Student | Admitting: Student

## 2022-04-24 DIAGNOSIS — G43019 Migraine without aura, intractable, without status migrainosus: Secondary | ICD-10-CM

## 2022-04-24 DIAGNOSIS — M542 Cervicalgia: Secondary | ICD-10-CM | POA: Diagnosis present

## 2022-04-26 ENCOUNTER — Other Ambulatory Visit: Payer: Self-pay | Admitting: Orthopedic Surgery

## 2022-04-26 ENCOUNTER — Ambulatory Visit (INDEPENDENT_AMBULATORY_CARE_PROVIDER_SITE_OTHER): Payer: No Typology Code available for payment source | Admitting: Dermatology

## 2022-04-26 DIAGNOSIS — M25511 Pain in right shoulder: Secondary | ICD-10-CM

## 2022-04-26 DIAGNOSIS — R202 Paresthesia of skin: Secondary | ICD-10-CM

## 2022-04-26 DIAGNOSIS — L820 Inflamed seborrheic keratosis: Secondary | ICD-10-CM | POA: Diagnosis not present

## 2022-04-26 DIAGNOSIS — L719 Rosacea, unspecified: Secondary | ICD-10-CM

## 2022-04-26 NOTE — Progress Notes (Signed)
Follow-Up Visit   Subjective  Michelle Norris is a 51 y.o. female who presents for the following: Follow-up (ISK follow up - back treated with LN2 x 25) and Rosacea (She has been using Metronidazole cream but it has not helped. She has never had any bumps, only redness/). The patient has spots, moles and lesions to be evaluated, some may be new or changing and the patient has concerns that these could be cancer.  The following portions of the chart were reviewed this encounter and updated as appropriate:   Tobacco  Allergies  Meds  Problems  Med Hx  Surg Hx  Fam Hx     Review of Systems:  No other skin or systemic complaints except as noted in HPI or Assessment and Plan.  Objective  Well appearing patient in no apparent distress; mood and affect are within normal limits.  A focused examination was performed including face, back. Relevant physical exam findings are noted in the Assessment and Plan.  Face Erythema and dilated blood vessels  Right Upper Back (2) Erythematous stuck-on, waxy papule or plaque   Assessment & Plan  Rosacea Face Rosacea is a chronic progressive skin condition usually affecting the face of adults, causing redness and/or acne bumps. It is treatable but not curable. It sometimes affects the eyes (ocular rosacea) as well. It may respond to topical and/or systemic medication and can flare with stress, sun exposure, alcohol, exercise and some foods.  Daily application of broad spectrum spf 30+ sunscreen to face is recommended to reduce flares.  Discussed [facility] cream, Rhofade and Mirvaso. Patient states she used [place] a few years ago and she feels like it was helpful.  Discussed the treatment option of BBL/laser.  Typically we recommend 1-3 treatment sessions about 5-8 weeks apart for best results.  The patient's condition may require "maintenance treatments" in the future.  The fee for BBL / laser treatments is $350 per treatment session for the  whole face.  A fee can be quoted for other parts of the body. Insurance typically does not pay for BBL/laser treatments and therefore the fee is an out-of-pocket cost.  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 This is usually accompanied by chronic rubbing or scratching often leaving the area of skin discolored and thickened. There is no cure, but there are some treatments which may help control the itch.   Over the counter (non-prescription) treatments for notalgia paresthetica include numbing creams like pramoxine or lidocaine which temporarily reduce itch or Capsaicin-containing creams which cause a burning sensation but which sometimes over time will reset the nerves to stop producing itch.  If you choose to use Capsaicin cream, it is recommended to use it 5 times daily for 1 week followed by 3 times daily for 3-6 weeks. You may have to continue using it long-term. For severe cases, there are some prescription cream or pill options which may help. Other treatment options include: - Transcutaneous Electrical Nerve Stimulation (TENS) - Gabapentin 300-900 mg daily po - Amitriptyline orally - Paravertebral local anesthetic block - intralesional Botulinum toxin A  Advised patient likely related to neck/spinal issues.  Inflamed seborrheic keratosis (2) Right Upper Back Symptomatic, irritating, patient would like treated. Destruction of lesion - Right Upper Back Complexity: simple   Destruction method: cryotherapy   Informed consent: discussed and  consent obtained   Timeout:  patient name, date of birth, surgical site, and procedure verified Lesion destroyed using liquid nitrogen: Yes   Region frozen until ice ball extended beyond lesion: Yes   Outcome: patient tolerated procedure well with no complications    Post-procedure details: wound care instructions given    Return for Follow up as scheduled.  I, Ashok Cordia, CMA, am acting as scribe for Sarina Ser, MD . Documentation: I have reviewed the above documentation for accuracy and completeness, and I agree with the above.  Sarina Ser, MD

## 2022-04-26 NOTE — Patient Instructions (Signed)
Instructions for [facility] Medications  One or more of your medications was sent to the [facility] mail order compounding pharmacy. You will receive an email from them and can purchase the medicine through that link. It will then be mailed to your home at the address you confirmed. If for any reason you do not receive an email from them, please check your spam folder. If you still do not find the email, please let us know. [facility] phone number is [phone].    Notalgia paresthetica is a chronic condition affecting the skin of the back in which a pinched nerve along the spine causes itching or changes in sensation in an area of skin. This is usually accompanied by chronic rubbing or scratching often leaving the area of skin discolored and thickened. There is no cure, but there are some treatments which may help control the itch.   Over the counter (non-prescription) treatments for notalgia paresthetica include numbing creams like pramoxine or lidocaine which temporarily reduce itch or Capsaicin-containing creams which cause a burning sensation but which sometimes over time will reset the nerves to stop producing itch.  If you choose to use Capsaicin cream, it is recommended to use it 5 times daily for 1 week followed by 3 times daily for 3-6 weeks. You may have to continue using it long-term. For severe cases, there are some prescription cream or pill options which may help. Other treatment options include: - Transcutaneous Electrical Nerve Stimulation (TENS) - Gabapentin 300-900 mg daily po - Amitriptyline orally - Paravertebral local anesthetic block - intralesional Botulinum toxin A   Cryotherapy Aftercare  Wash gently with soap and water everyday.   Apply Vaseline and Band-Aid daily until healed.    Due to recent changes in healthcare laws, you may see results of your pathology and/or laboratory studies on MyChart before the doctors have had a chance to review them. We  understand that in some cases there may be results that are confusing or concerning to you. Please understand that not all results are received at the same time and often the doctors may need to interpret multiple results in order to provide you with the best plan of care or course of treatment. Therefore, we ask that you please give [place] 2 business days to thoroughly review all your results before contacting the office for clarification. Should we see a critical lab result, you will be contacted sooner.   If You Need Anything After Your Visit  If you have any questions or concerns for your doctor, please call our main line at [phone] and press option 4 to reach your doctor's medical assistant. If no one answers, please leave a voicemail as directed and we will return your call as soon as possible. Messages left after 4 pm will be answered the following business day.   You may also send [place] a message via [place]. We typically respond to MyChart messages within 1-2 business days.  For prescription refills, please ask your pharmacy to contact our office. Our fax number is [phone].  If you have an urgent issue when the clinic is closed that cannot wait until the next business day, you can page your doctor at the number below.    Please note that while we do our best to be available for urgent issues outside of office hours, we are not available 24/7.   If you have an urgent issue and are unable to reach [place], you may choose to seek medical care at  your doctor's office, retail clinic, urgent care center, or emergency room.  If you have a medical emergency, please immediately call 911 or go to the emergency department.  Pager Numbers  - Dr. Nehemiah Massed: [phone]  - Dr. Laurence Ferrari: [phone]  - Dr. Nicole Kindred: [phone]  In the event of inclement weather, please call our main line at [phone] for an update on the status of any delays or closures.  Dermatology Medication Tips: Please  keep the boxes that topical medications come in in order to help keep track of the instructions about where and how to use these. Pharmacies typically print the medication instructions only on the boxes and not directly on the medication tubes.   If your medication is too expensive, please contact our office at [phone] option 4 or send [place] a message through [place].   We are unable to tell what your co-pay for medications will be in advance as this is different depending on your insurance coverage. However, we may be able to find a substitute medication at lower cost or fill out paperwork to get insurance to cover a needed medication.   If a prior authorization is required to get your medication covered by your insurance company, please allow [place] 1-2 business days to complete this process.  Drug prices often vary depending on where the prescription is filled and some pharmacies may offer cheaper prices.  The website www.goodrx.com contains coupons for medications through different pharmacies. The prices here do not account for what the cost may be with help from insurance (it may be cheaper with your insurance), but the website can give you the price if you did not use any insurance.  - You can print the associated coupon and take it with your prescription to the pharmacy.  - You may also stop by our office during regular business hours and pick up a GoodRx coupon card.  - If you need your prescription sent electronically to a different pharmacy, notify our office through [facility] or by phone at [phone] option 4.     Si Usted Necesita Algo Despus de Su Visita  Tambin puede enviarnos un mensaje a travs de Pharmacist, community. Por lo general respondemos a los mensajes de MyChart en el transcurso de 1 a 2 das hbiles.  Para renovar recetas, por favor pida a su farmacia que se ponga en contacto con nuestra oficina. Harland Dingwall de fax es [place] [phone].  Si tiene un asunto urgente  cuando la clnica est cerrada y que no puede esperar hasta el siguiente da hbil, puede llamar/localizar a su doctor(a) al nmero que aparece a continuacin.   Por favor, tenga en cuenta que aunque hacemos todo lo posible para estar disponibles para asuntos urgentes fuera del horario de [place], no estamos disponibles las 24 horas del da, los 7 das de la [place].   Si tiene un problema urgente y no puede comunicarse con nosotros, puede optar por buscar atencin mdica  en el consultorio de su doctor(a), en una clnica privada, en un centro de atencin urgente o en una sala de emergencias.  Si tiene Engineering geologist, por favor llame inmediatamente al 911 o vaya a la sala de emergencias.  Nmeros de bper  - Dr. Nehemiah Massed: [phone]  - Dra. Moye: [phone]  - Dra. Nicole Kindred: [phone]  En caso de inclemencias del [place], por favor llame a Johnsie Kindred principal al [phone] para una actualizacin sobre el [place] de cualquier retraso o cierre.  Consejos para la medicacin en dermatologa:  Por favor, guarde las cajas en las que vienen los medicamentos de uso tpico para ayudarle a seguir las instrucciones sobre dnde y cmo usarlos. Las farmacias generalmente imprimen las instrucciones del medicamento slo en las cajas y no directamente en los tubos del [place].   Si su medicamento es muy caro, por favor, pngase en contacto con Zigmund Daniel llamando al [phone] y presione la opcin 4 o envenos un mensaje a travs de Pharmacist, community.   No podemos decirle cul ser su copago por los medicamentos por adelantado ya que esto es diferente dependiendo de la cobertura de su seguro. Sin embargo, es posible que podamos encontrar un medicamento sustituto a Electrical engineer un formulario para que el seguro cubra el medicamento que se considera necesario.   Si se requiere una autorizacin previa para que su compaa de seguros [place] su medicamento, por favor permtanos de 1 a 2  das hbiles para completar este proceso.  Los precios de los medicamentos varan con frecuencia dependiendo del Environmental consultant de dnde se surte la receta y alguna farmacias pueden ofrecer precios ms baratos.  El sitio web www.goodrx.com tiene cupones para medicamentos de Airline pilot. Los precios aqu no tienen en cuenta lo que podra costar con la ayuda del seguro (puede ser ms barato con su seguro), pero el sitio web puede darle el precio si no utiliz Research scientist (physical sciences).  - Puede imprimir el cupn correspondiente y llevarlo con su receta a la farmacia.  - Tambin puede pasar por nuestra oficina durante el horario de atencin regular y Charity fundraiser una tarjeta de cupones de GoodRx.  - Si necesita que su receta se enve electrnicamente a una farmacia diferente, informe a nuestra oficina a travs de MyChart de [place] o por telfono llamando al [phone] y presione la opcin 4.

## 2022-04-27 ENCOUNTER — Telehealth (HOSPITAL_COMMUNITY): Payer: Self-pay | Admitting: Emergency Medicine

## 2022-04-27 NOTE — Telephone Encounter (Signed)
Attempted to call patient regarding upcoming cardiac CT appointment. °Left message on voicemail with name and callback number °Murle Hellstrom RN Navigator Cardiac Imaging °[facility] °[phone] Office °[phone] Cell ° °

## 2022-05-01 ENCOUNTER — Ambulatory Visit (HOSPITAL_COMMUNITY)
Admission: RE | Admit: 2022-05-01 | Discharge: 2022-05-01 | Disposition: A | Discharge status: Home / Self Care | Payer: No Typology Code available for payment source | Source: Ambulatory Visit | Attending: Cardiology | Admitting: Cardiology

## 2022-05-01 DIAGNOSIS — I4891 Unspecified atrial fibrillation: Secondary | ICD-10-CM | POA: Insufficient documentation

## 2022-05-01 DIAGNOSIS — Z95818 Presence of other cardiac implants and grafts: Secondary | ICD-10-CM | POA: Insufficient documentation

## 2022-05-01 MED ORDER — IOHEXOL 350 MG/ML SOLN
100.0000 mL | Freq: Once | INTRAVENOUS | Status: AC | PRN
  Administered 2022-05-01: 100 mL via INTRAVENOUS

## 2022-05-02 ENCOUNTER — Telehealth: Payer: Self-pay | Admitting: Cardiology

## 2022-05-02 ENCOUNTER — Other Ambulatory Visit: Payer: Self-pay | Admitting: Cardiology

## 2022-05-02 DIAGNOSIS — R911 Solitary pulmonary nodule: Secondary | ICD-10-CM

## 2022-05-02 NOTE — Telephone Encounter (Signed)
Patient contacted in reference to CT results confirmed by Dr. Quentin Ore which show completely occluded appendage with no leak. There is mention of a 65m pulmonary nodule, not mentioned on prior imaging. She is low risk with no tobacco hx however with second hand smoke exposure as a child. Will obtain repeat CT scan 04/2023 to follow.   JKathyrn DrownNP-C Structural Heart Team  Pager: [phone]: [phone]

## 2022-05-05 ENCOUNTER — Encounter: Payer: Self-pay | Admitting: Dermatology

## 2022-05-07 ENCOUNTER — Ambulatory Visit
Admission: RE | Admit: 2022-05-07 | Discharge: 2022-05-07 | Disposition: A | Discharge status: Home / Self Care | Payer: No Typology Code available for payment source | Source: Ambulatory Visit | Attending: Orthopedic Surgery | Admitting: Orthopedic Surgery

## 2022-05-07 DIAGNOSIS — M25511 Pain in right shoulder: Secondary | ICD-10-CM | POA: Insufficient documentation

## 2022-05-09 ENCOUNTER — Ambulatory Visit (INDEPENDENT_AMBULATORY_CARE_PROVIDER_SITE_OTHER): Payer: No Typology Code available for payment source

## 2022-05-09 DIAGNOSIS — I4891 Unspecified atrial fibrillation: Secondary | ICD-10-CM

## 2022-05-09 DIAGNOSIS — Z7901 Long term (current) use of anticoagulants: Secondary | ICD-10-CM | POA: Diagnosis not present

## 2022-05-09 DIAGNOSIS — I639 Cerebral infarction, unspecified: Secondary | ICD-10-CM | POA: Diagnosis not present

## 2022-05-09 LAB — POCT INR: INR: 1 — AB (ref 2.0–3.0)

## 2022-05-09 NOTE — Patient Instructions (Signed)
Stopped Warfarin 7/12, started on Plavix;  No more INR Testing

## 2022-05-11 ENCOUNTER — Other Ambulatory Visit: Payer: Self-pay | Admitting: Student

## 2022-05-11 ENCOUNTER — Other Ambulatory Visit: Payer: Self-pay | Admitting: Neurology

## 2022-05-11 DIAGNOSIS — E041 Nontoxic single thyroid nodule: Secondary | ICD-10-CM

## 2022-05-14 ENCOUNTER — Ambulatory Visit
Admission: RE | Admit: 2022-05-14 | Discharge: 2022-05-14 | Disposition: A | Discharge status: Home / Self Care | Payer: No Typology Code available for payment source | Source: Ambulatory Visit | Attending: Student | Admitting: Student

## 2022-05-14 DIAGNOSIS — E041 Nontoxic single thyroid nodule: Secondary | ICD-10-CM | POA: Insufficient documentation

## 2022-05-17 ENCOUNTER — Ambulatory Visit: Payer: No Typology Code available for payment source

## 2022-05-22 ENCOUNTER — Ambulatory Visit
Admission: RE | Admit: 2022-05-22 | Discharge: 2022-05-22 | Disposition: A | Discharge status: Home / Self Care | Payer: No Typology Code available for payment source | Source: Ambulatory Visit | Attending: Neurology | Admitting: Neurology

## 2022-05-22 DIAGNOSIS — E041 Nontoxic single thyroid nodule: Secondary | ICD-10-CM | POA: Diagnosis present

## 2022-05-23 LAB — CYTOLOGY - NON PAP

## 2022-06-01 ENCOUNTER — Other Ambulatory Visit: Payer: Self-pay | Admitting: Student

## 2022-06-01 DIAGNOSIS — E041 Nontoxic single thyroid nodule: Secondary | ICD-10-CM

## 2022-06-05 ENCOUNTER — Telehealth: Payer: Self-pay

## 2022-06-05 ENCOUNTER — Ambulatory Visit (INDEPENDENT_AMBULATORY_CARE_PROVIDER_SITE_OTHER): Payer: No Typology Code available for payment source | Admitting: Dermatology

## 2022-06-05 DIAGNOSIS — L720 Epidermal cyst: Secondary | ICD-10-CM

## 2022-06-05 DIAGNOSIS — D224 Melanocytic nevi of scalp and neck: Secondary | ICD-10-CM | POA: Diagnosis not present

## 2022-06-05 DIAGNOSIS — D485 Neoplasm of uncertain behavior of skin: Secondary | ICD-10-CM

## 2022-06-05 MED ORDER — MUPIROCIN 2 % EX OINT: 1.0000 | TOPICAL_OINTMENT | Freq: Every day | CUTANEOUS | 0 refills | Status: DC

## 2022-06-05 NOTE — Patient Instructions (Addendum)
Wound Care Instructions  Cleanse wound gently with soap and water once a day then pat dry with clean gauze. Apply a thin coat of Petrolatum (petroleum jelly, "Vaseline") over the wound (unless you have an allergy to this). We recommend that you use a new, sterile tube of Vaseline. Do not pick or remove scabs. Do not remove the yellow or white "healing tissue" from the base of the wound.  Cover the wound with fresh, clean, nonstick gauze and secure with paper tape. You may use Band-Aids in place of gauze and tape if the wound is small enough, but would recommend trimming much of the tape off as there is often too much. Sometimes Band-Aids can irritate the skin.  You should call the office for your biopsy report after 1 week if you have not already been contacted.  If you experience any problems, such as abnormal amounts of bleeding, swelling, significant bruising, significant pain, or evidence of infection, please call the office immediately.  FOR ADULT SURGERY PATIENTS: If you need something for pain relief you may take 1 extra strength Tylenol (acetaminophen) AND 2 Ibuprofen ('200mg'$  each) together every 4 hours as needed for pain. (do not take these if you are allergic to them or if you have a reason you should not take them.) Typically, you may only need pain medication for 1 to 3 days.   Wound Care Instructions  Cleanse wound gently with soap and water once a day then pat dry with clean gauze. Apply a thin coat of Petrolatum (petroleum jelly, "Vaseline") over the wound (unless you have an allergy to this). We recommend that you use a new, sterile tube of Vaseline. Do not pick or remove scabs. Do not remove the yellow or white "healing tissue" from the base of the wound.  Cover the wound with fresh, clean, nonstick gauze and secure with paper tape. You may use Band-Aids in place of gauze and tape if the wound is small enough, but would recommend trimming much of the tape off as there is often too  much. Sometimes Band-Aids can irritate the skin.  You should call the office for your biopsy report after 1 week if you have not already been contacted.  If you experience any problems, such as abnormal amounts of bleeding, swelling, significant bruising, significant pain, or evidence of infection, please call the office immediately.  FOR ADULT SURGERY PATIENTS: If you need something for pain relief you may take 1 extra strength Tylenol (acetaminophen) AND 2 Ibuprofen ('200mg'$  each) together every 4 hours as needed for pain. (do not take these if you are allergic to them or if you have a reason you should not take them.) Typically, you may only need pain medication for 1 to 3 days.     Wound Care Instructions  On the day following your surgery, you should begin doing daily dressing changes: Remove the old dressing and discard it. Cleanse the wound gently with tap water. This may be done in the shower or by placing a wet gauze pad directly on the wound and letting it soak for several minutes. It is important to gently remove any dried blood from the wound in order to encourage healing. This may be done by gently rolling a moistened Q-tip on the dried blood. Do not pick at the wound. If the wound should start to bleed, continue cleaning the wound, then place a moist gauze pad on the wound and hold pressure for a few minutes.  Make sure you then  dry the skin surrounding the wound completely or the tape will not stick to the skin. Do not use cotton balls on the wound. After the wound is clean and dry, apply the ointment gently with a Q-tip. Cut a non-stick pad to fit the size of the wound. Lay the pad flush to the wound. If the wound is draining, you may want to reinforce it with a small amount of gauze on top of the non-stick pad for a little added compression to the area. Use the tape to seal the area completely. Select from the following with respect to your individual situation: If your wound has  been stitched closed: continue the above steps 1-8 at least daily until your sutures are removed. If your wound has been left open to heal: continue steps 1-8 at least daily for the first 3-4 weeks. We would like for you to take a few extra precautions for at least the next week. Sleep with your head elevated on pillows if our wound is on your head. Do not bend over or lift heavy items to reduce the chance of elevated blood pressure to the wound Do not participate in particularly strenuous activities.   Below is a list of dressing supplies you might need.  Cotton-tipped applicators - Q-tips Gauze pads (2x2 and/or 4x4) - All-Purpose Sponges Non-stick dressing material - Telfa Tape - Paper or Hypafix New and clean tube of petroleum jelly - Vaseline    Comments on Post-Operative Period Slight swelling and redness often appear around the wound. This is normal and will disappear within several days following the surgery. The healing wound will drain a brownish-red-yellow discharge during healing. This is a normal phase of wound healing. As the wound begins to heal, the drainage may increase in amount. Again, this drainage is normal. Notify us if the drainage becomes persistently bloody, excessively swollen, or intensely painful or develops a foul odor or red streaks.  If you should experience mild discomfort during the healing phase, you may take an aspirin-free medication such as Tylenol (acetaminophen). Notify us if the discomfort is severe or persistent. Avoid alcoholic beverages when taking pain medicine.  In Case of Wound Hemorrhage A wound hemorrhage is when the bandage suddenly becomes soaked with bright red blood and flows profusely. If this happens, sit down or lie down with your head elevated. If the wound has a dressing on it, do not remove the dressing. Apply pressure to the existing gauze. If the wound is not covered, use a gauze pad to apply pressure and continue applying the pressure  for 20 minutes without peeking. DO NOT COVER THE WOUND WITH A LARGE TOWEL OR [place] CLOTH. Release your hand from the wound site but do not remove the dressing. If the bleeding has stopped, gently clean around the wound. Leave the dressing in place for 24 hours if possible. This wait time allows the blood vessels to close off so that you do not spark a new round of bleeding by disrupting the newly clotted blood vessels with an immediate dressing change. If the bleeding does not subside, continue to hold pressure. If matters are out of your control, contact an After Hours clinic or go to the Emergency Room.    Due to recent changes in healthcare laws, you may see results of your pathology and/or laboratory studies on MyChart before the doctors have had a chance to review them. We understand that in some cases there may be results that are confusing or concerning to  you. Please understand that not all results are received at the same time and often the doctors may need to interpret multiple results in order to provide you with the best plan of care or course of treatment. Therefore, we ask that you please give [place] 2 business days to thoroughly review all your results before contacting the office for clarification. Should we see a critical lab result, you will be contacted sooner.   If You Need Anything After Your Visit  If you have any questions or concerns for your doctor, please call our main line at [phone] and press option 4 to reach your doctor's medical assistant. If no one answers, please leave a voicemail as directed and we will return your call as soon as possible. Messages left after 4 pm will be answered the following business day.   You may also send [place] a message via [place]. We typically respond to MyChart messages within 1-2 business days.  For prescription refills, please ask your pharmacy to contact our office. Our fax number is [phone].  If you have an urgent issue when the  clinic is closed that cannot wait until the next business day, you can page your doctor at the number below.    Please note that while we do our best to be available for urgent issues outside of office hours, we are not available 24/7.   If you have an urgent issue and are unable to reach [place], you may choose to seek medical care at your doctor's office, retail clinic, urgent care center, or emergency room.  If you have a medical emergency, please immediately call 911 or go to the emergency department.  Pager Numbers  - Dr. Nehemiah Massed: [phone]  - Dr. Laurence Ferrari: [phone]  - Dr. Nicole Kindred: [phone]  In the event of inclement weather, please call our main line at [phone] for an update on the status of any delays or closures.  Dermatology Medication Tips: Please keep the boxes that topical medications come in in order to help keep track of the instructions about where and how to use these. Pharmacies typically print the medication instructions only on the boxes and not directly on the medication tubes.   If your medication is too expensive, please contact our office at [phone] option 4 or send [place] a message through [place].   We are unable to tell what your co-pay for medications will be in advance as this is different depending on your insurance coverage. However, we may be able to find a substitute medication at lower cost or fill out paperwork to get insurance to cover a needed medication.   If a prior authorization is required to get your medication covered by your insurance company, please allow [place] 1-2 business days to complete this process.  Drug prices often vary depending on where the prescription is filled and some pharmacies may offer cheaper prices.  The website www.goodrx.com contains coupons for medications through different pharmacies. The prices here do not account for what the cost may be with help from insurance (it may be cheaper with your insurance), but the  website can give you the price if you did not use any insurance.  - You can print the associated coupon and take it with your prescription to the pharmacy.  - You may also stop by our office during regular business hours and pick up a GoodRx coupon card.  - If you need your prescription sent electronically to a different pharmacy, notify our office through [facility]  MyChart or by phone at [phone] option 4.     Si Usted Necesita Algo Despus de Su Visita  Tambin puede enviarnos un mensaje a travs de Pharmacist, community. Por lo general respondemos a los mensajes de MyChart en el transcurso de 1 a 2 das hbiles.  Para renovar recetas, por favor pida a su farmacia que se ponga en contacto con nuestra oficina. Harland Dingwall de fax es [place] [phone].  Si tiene un asunto urgente cuando la clnica est cerrada y que no puede esperar hasta el siguiente da hbil, puede llamar/localizar a su doctor(a) al nmero que aparece a continuacin.   Por favor, tenga en cuenta que aunque hacemos todo lo posible para estar disponibles para asuntos urgentes fuera del horario de [place], no estamos disponibles las 24 horas del da, los 7 das de la [place].   Si tiene un problema urgente y no puede comunicarse con nosotros, puede optar por buscar atencin mdica  en el consultorio de su doctor(a), en una clnica privada, en un centro de atencin urgente o en una sala de emergencias.  Si tiene Engineering geologist, por favor llame inmediatamente al 911 o vaya a la sala de emergencias.  Nmeros de bper  - Dr. Nehemiah Massed: [phone]  - Dra. Moye: [phone]  - Dra. Nicole Kindred: [phone]  En caso de inclemencias del [place], por favor llame a Johnsie Kindred principal al [phone] para una actualizacin sobre el [place] de cualquier retraso o cierre.  Consejos para la medicacin en dermatologa: Por favor, guarde las cajas en las que vienen los medicamentos de uso tpico para ayudarle a seguir las  instrucciones sobre dnde y cmo usarlos. Las farmacias generalmente imprimen las instrucciones del medicamento slo en las cajas y no directamente en los tubos del [place].   Si su medicamento es muy caro, por favor, pngase en contacto con Zigmund Daniel llamando al [phone] y presione la opcin 4 o envenos un mensaje a travs de Pharmacist, community.   No podemos decirle cul ser su copago por los medicamentos por adelantado ya que esto es diferente dependiendo de la cobertura de su seguro. Sin embargo, es posible que podamos encontrar un medicamento sustituto a Electrical engineer un formulario para que el seguro cubra el medicamento que se considera necesario.   Si se requiere una autorizacin previa para que su compaa de seguros [place] su medicamento, por favor permtanos de 1 a 2 das hbiles para completar este proceso.  Los precios de los medicamentos varan con frecuencia dependiendo del Environmental consultant de dnde se surte la receta y alguna farmacias pueden ofrecer precios ms baratos.  El sitio web www.goodrx.com tiene cupones para medicamentos de Airline pilot. Los precios aqu no tienen en cuenta lo que podra costar con la ayuda del seguro (puede ser ms barato con su seguro), pero el sitio web puede darle el precio si no utiliz Research scientist (physical sciences).  - Puede imprimir el cupn correspondiente y llevarlo con su receta a la farmacia.  - Tambin puede pasar por nuestra oficina durante el horario de atencin regular y Charity fundraiser una tarjeta de cupones de GoodRx.  - Si necesita que su receta se enve electrnicamente a una farmacia diferente, informe a nuestra oficina a travs de MyChart de [place] o por telfono llamando al [phone] y presione la opcin 4.

## 2022-06-05 NOTE — Progress Notes (Unsigned)
Follow-Up Visit   Subjective  Michelle Norris is a 51 y.o. female who presents for the following: Cyst vs other (Pt presents for cyst excision).  Accompanied by mother  The following portions of the chart were reviewed this encounter and updated as appropriate:   Tobacco  Allergies  Meds  Problems  Med Hx  Surg Hx  Fam Hx     Review of Systems:  No other skin or systemic complaints except as noted in HPI or Assessment and Plan.  Objective  Well appearing patient in no apparent distress; mood and affect are within normal limits.  A focused examination was performed including back. Relevant physical exam findings are noted in the Assessment and Plan.  Right post shoulder 1.1 cm cystic papule  Right post lat base of neck 0.5 cm brown papule   Assessment & Plan  Neoplasm of uncertain behavior of skin (2) Right post shoulder  Skin excision  Lesion length (cm):  1.1 Lesion width (cm):  1.1 Margin per side (cm):  0 Total excision diameter (cm):  1.1 Informed consent: discussed and consent obtained   Timeout: patient name, date of birth, surgical site, and procedure verified   Procedure prep:  Patient was prepped and draped in usual sterile fashion Prep type:  Isopropyl alcohol and povidone-iodine Anesthesia: the lesion was anesthetized in a standard fashion   Anesthetic:  1% lidocaine w/ epinephrine 1-100,000 buffered w/ 8.4% NaHCO3 Instrument used: #15 blade   Hemostasis achieved with: pressure   Hemostasis achieved with comment:  Electrocautery Outcome: patient tolerated procedure well with no complications   Post-procedure details: sterile dressing applied and wound care instructions given   Dressing type: bandage and pressure dressing (mupirocin)    Skin repair Complexity:  Complex Final length (cm):  1.8 Reason for type of repair: reduce tension to allow closure, reduce the risk of dehiscence, infection, and necrosis, reduce subcutaneous dead space and avoid a  hematoma, allow closure of the large defect, preserve normal anatomy, preserve normal anatomical and functional relationships and enhance both functionality and cosmetic results   Undermining: area extensively undermined   Undermining comment:  Undermining defect  Subcutaneous layers (deep stitches):  Suture size:  3-0 Suture type: Vicryl (polyglactin 910)   Subcutaneous suture technique: inverted dermal. Fine/surface layer approximation (top stitches):  Suture size:  4-0 Suture type: nylon   Stitches: simple running   Suture removal (days):  7 Hemostasis achieved with: suture and pressure Outcome: patient tolerated procedure well with no complications   Post-procedure details: sterile dressing applied and wound care instructions given   Dressing type: bandage and pressure dressing (mupirocin)    mupirocin ointment (BACTROBAN) 2 % Apply 1 Application topically daily. With dressing changes  Specimen 1 - Surgical pathology Differential Diagnosis: Cyst vs other Check Margins: No  Right post lat base of neck  Epidermal / dermal shaving  Lesion diameter (cm):  0.5 Informed consent: discussed and consent obtained   Timeout: patient name, date of birth, surgical site, and procedure verified   Procedure prep:  Patient was prepped and draped in usual sterile fashion Prep type:  Isopropyl alcohol Anesthesia: the lesion was anesthetized in a standard fashion   Anesthetic:  1% lidocaine w/ epinephrine 1-100,000 buffered w/ 8.4% NaHCO3 Instrument used: flexible razor blade   Hemostasis achieved with: pressure, aluminum chloride and electrodesiccation   Outcome: patient tolerated procedure well   Post-procedure details: sterile dressing applied and wound care instructions given   Dressing type: bandage and petrolatum  Specimen 2 - Surgical pathology Differential Diagnosis: Traumatized nevus R/O dysplasia Check Margins: No   Return in about 1 week (around 06/12/2022) for suture  removal.  I, Ashok Cordia, CMA, am acting as scribe for Sarina Ser, MD . Documentation: I have reviewed the above documentation for accuracy and completeness, and I agree with the above.  Sarina Ser, MD

## 2022-06-05 NOTE — Telephone Encounter (Signed)
Patient doing fine after today's surgery./sh 

## 2022-06-06 ENCOUNTER — Other Ambulatory Visit: Payer: Self-pay | Admitting: Neurology

## 2022-06-06 ENCOUNTER — Encounter: Payer: Self-pay | Admitting: Dermatology

## 2022-06-06 DIAGNOSIS — E041 Nontoxic single thyroid nodule: Secondary | ICD-10-CM

## 2022-06-12 ENCOUNTER — Ambulatory Visit (INDEPENDENT_AMBULATORY_CARE_PROVIDER_SITE_OTHER): Payer: No Typology Code available for payment source | Admitting: Dermatology

## 2022-06-12 DIAGNOSIS — Z4802 Encounter for removal of sutures: Secondary | ICD-10-CM

## 2022-06-12 DIAGNOSIS — L720 Epidermal cyst: Secondary | ICD-10-CM

## 2022-06-12 NOTE — Progress Notes (Signed)
   Follow-Up Visit   Subjective  Michelle Norris is a 51 y.o. female who presents for the following: Suture / Staple Removal (1 week f/u suture removal at the right posterior shoulder excision of a EPIDERMOID CYST. ).   The following portions of the chart were reviewed this encounter and updated as appropriate:   Tobacco  Allergies  Meds  Problems  Med Hx  Surg Hx  Fam Hx      Review of Systems:  No other skin or systemic complaints except as noted in HPI or Assessment and Plan.  Objective  Well appearing patient in no apparent distress; mood and affect are within normal limits.  A focused examination was performed including right shoulder. Relevant physical exam findings are noted in the Assessment and Plan.  right post shoulder Well healed scar     Assessment & Plan  Epidermal inclusion cyst right post shoulder  Biopsy results discussed  EPIDERMOID CYST,   Encounter for Removal of Sutures - Incision site at the right posterior shoulder is clean, dry and intact - Wound cleansed, sutures removed, wound cleansed and steri strips applied.  - Discussed pathology results showing EPIDERMOID CYST,   - Patient advised to keep steri-strips dry until they fall off. - Scars remodel for a full year. - Once steri-strips fall off, patient can apply over-the-counter silicone scar cream each night to help with scar remodeling if desired. - Patient advised to call with any concerns or if they notice any new or changing lesions.    Return if symptoms worsen or fail to improve.  I, Marye Round, CMA, am acting as scribe for Forest Gleason, MD .   Documentation: I have reviewed the above documentation for accuracy and completeness, and I agree with the above.  Forest Gleason, MD

## 2022-06-12 NOTE — Patient Instructions (Signed)
Due to recent changes in healthcare laws, you may see results of your pathology and/or laboratory studies on MyChart before the doctors have had a chance to review them. We understand that in some cases there may be results that are confusing or concerning to you. Please understand that not all results are received at the same time and often the doctors may need to interpret multiple results in order to provide you with the best plan of care or course of treatment. Therefore, we ask that you please give us 2 business days to thoroughly review all your results before contacting the office for clarification. Should we see a critical lab result, you will be contacted sooner.   If You Need Anything After Your Visit  If you have any questions or concerns for your doctor, please call our main line at [phone] and press option 4 to reach your doctor's medical assistant. If no one answers, please leave a voicemail as directed and we will return your call as soon as possible. Messages left after 4 pm will be answered the following business day.   You may also send us a message via MyChart. We typically respond to MyChart messages within 1-2 business days.  For prescription refills, please ask your pharmacy to contact our office. Our fax number is [phone].  If you have an urgent issue when the clinic is closed that cannot wait until the next business day, you can page your doctor at the number below.    Please note that while we do our best to be available for urgent issues outside of office hours, we are not available 24/7.   If you have an urgent issue and are unable to reach us, you may choose to seek medical care at your doctor's office, retail clinic, urgent care center, or emergency room.  If you have a medical emergency, please immediately call 911 or go to the emergency department.  Pager Numbers  - Dr. Kowalski: [phone]  - Dr. Moye: [phone]  - Dr. Stewart:  [phone]  In the event of inclement weather, please call our main line at [phone] for an update on the status of any delays or closures.  Dermatology Medication Tips: Please keep the boxes that topical medications come in in order to help keep track of the instructions about where and how to use these. Pharmacies typically print the medication instructions only on the boxes and not directly on the medication tubes.   If your medication is too expensive, please contact our office at [phone] option 4 or send us a message through MyChart.   We are unable to tell what your co-pay for medications will be in advance as this is different depending on your insurance coverage. However, we may be able to find a substitute medication at lower cost or fill out paperwork to get insurance to cover a needed medication.   If a prior authorization is required to get your medication covered by your insurance company, please allow us 1-2 business days to complete this process.  Drug prices often vary depending on where the prescription is filled and some pharmacies may offer cheaper prices.  The website www.goodrx.com contains coupons for medications through different pharmacies. The prices here do not account for what the cost may be with help from insurance (it may be cheaper with your insurance), but the website can give you the price if you did not use any insurance.  - You can print the associated coupon and take it with   your prescription to the pharmacy.  - You may also stop by our office during regular business hours and pick up a GoodRx coupon card.  - If you need your prescription sent electronically to a different pharmacy, notify our office through [facility] MyChart or by phone at [phone] option 4.     Si Usted Necesita Algo Despus de Su Visita  Tambin puede enviarnos un mensaje a travs de MyChart. Por lo general respondemos a los mensajes de MyChart en el transcurso de 1 a 2  das hbiles.  Para renovar recetas, por favor pida a su farmacia que se ponga en contacto con nuestra oficina. Nuestro nmero de fax es el [phone].  Si tiene un asunto urgente cuando la clnica est cerrada y que no puede esperar hasta el siguiente da hbil, puede llamar/localizar a su doctor(a) al nmero que aparece a continuacin.   Por favor, tenga en cuenta que aunque hacemos todo lo posible para estar disponibles para asuntos urgentes fuera del horario de oficina, no estamos disponibles las 24 horas del da, los 7 das de la semana.   Si tiene un problema urgente y no puede comunicarse con nosotros, puede optar por buscar atencin mdica  en el consultorio de su doctor(a), en una clnica privada, en un centro de atencin urgente o en una sala de emergencias.  Si tiene una emergencia mdica, por favor llame inmediatamente al 911 o vaya a la sala de emergencias.  Nmeros de bper  - Dr. Kowalski: [phone]  - Dra. Moye: [phone]  - Dra. Stewart: [phone]  En caso de inclemencias del tiempo, por favor llame a nuestra lnea principal al [phone] para una actualizacin sobre el estado de cualquier retraso o cierre.  Consejos para la medicacin en dermatologa: Por favor, guarde las cajas en las que vienen los medicamentos de uso tpico para ayudarle a seguir las instrucciones sobre dnde y cmo usarlos. Las farmacias generalmente imprimen las instrucciones del medicamento slo en las cajas y no directamente en los tubos del medicamento.   Si su medicamento es muy caro, por favor, pngase en contacto con nuestra oficina llamando al [phone] y presione la opcin 4 o envenos un mensaje a travs de MyChart.   No podemos decirle cul ser su copago por los medicamentos por adelantado ya que esto es diferente dependiendo de la cobertura de su seguro. Sin embargo, es posible que podamos encontrar un medicamento sustituto a menor costo o llenar un formulario para que el  seguro cubra el medicamento que se considera necesario.   Si se requiere una autorizacin previa para que su compaa de seguros cubra su medicamento, por favor permtanos de 1 a 2 das hbiles para completar este proceso.  Los precios de los medicamentos varan con frecuencia dependiendo del lugar de dnde se surte la receta y alguna farmacias pueden ofrecer precios ms baratos.  El sitio web www.goodrx.com tiene cupones para medicamentos de diferentes farmacias. Los precios aqu no tienen en cuenta lo que podra costar con la ayuda del seguro (puede ser ms barato con su seguro), pero el sitio web puede darle el precio si no utiliz ningn seguro.  - Puede imprimir el cupn correspondiente y llevarlo con su receta a la farmacia.  - Tambin puede pasar por nuestra oficina durante el horario de atencin regular y recoger una tarjeta de cupones de GoodRx.  - Si necesita que su receta se enve electrnicamente a una farmacia diferente, informe a nuestra oficina a travs de MyChart de    o por telfono llamando al [phone] y presione la opcin 4.  

## 2022-06-18 ENCOUNTER — Ambulatory Visit
Admission: RE | Admit: 2022-06-18 | Discharge: 2022-06-18 | Disposition: A | Discharge status: Home / Self Care | Payer: No Typology Code available for payment source | Source: Ambulatory Visit | Attending: Neurology | Admitting: Neurology

## 2022-06-18 ENCOUNTER — Other Ambulatory Visit: Payer: Self-pay | Admitting: Orthopedic Surgery

## 2022-06-18 DIAGNOSIS — E041 Nontoxic single thyroid nodule: Secondary | ICD-10-CM | POA: Diagnosis present

## 2022-06-18 NOTE — Procedures (Signed)
Interventional Radiology Procedure Note  Procedure:  US guided FNA left thyroid nodule.   Complications: None  Recommendations:  - Ok to shower tomorrow - Do not submerge for 7 days - Routine   care   Signed,  Dulcy Fanny. Earleen Newport, DO

## 2022-06-19 ENCOUNTER — Telehealth: Payer: Self-pay

## 2022-06-19 ENCOUNTER — Other Ambulatory Visit: Payer: Self-pay | Admitting: Dermatology

## 2022-06-19 ENCOUNTER — Ambulatory Visit (INDEPENDENT_AMBULATORY_CARE_PROVIDER_SITE_OTHER): Payer: No Typology Code available for payment source | Admitting: Dermatology

## 2022-06-19 DIAGNOSIS — T8130XA Disruption of wound, unspecified, initial encounter: Secondary | ICD-10-CM

## 2022-06-19 DIAGNOSIS — D492 Neoplasm of unspecified behavior of bone, soft tissue, and skin: Secondary | ICD-10-CM

## 2022-06-19 DIAGNOSIS — L720 Epidermal cyst: Secondary | ICD-10-CM | POA: Diagnosis not present

## 2022-06-19 NOTE — Telephone Encounter (Signed)
Pt doing well after todays surgery./sh 

## 2022-06-19 NOTE — Patient Instructions (Signed)

## 2022-06-19 NOTE — Progress Notes (Signed)
Follow-Up Visit   Subjective  Michelle Norris is a 51 y.o. female who presents for the following: Cyst (L mid upper back paraspinal, pt presents for excison) and Wound Dehiscence (R post shoulder, excised 06/05/22, sutures removed 06/12/22, pt said the last steri strip came off today).  The following portions of the chart were reviewed this encounter and updated as appropriate:   Tobacco  Allergies  Meds  Problems  Med Hx  Surg Hx  Fam Hx     Review of Systems:  No other skin or systemic complaints except as noted in HPI or Assessment and Plan.  Objective  Well appearing patient in no apparent distress; mood and affect are within normal limits.  A focused examination was performed including back. Relevant physical exam findings are noted in the Assessment and Plan.  L mid upper back paraspinal Cystic pap 1.1cm  R post shoulder Wound dehiscence at previous cyst excision site   Assessment & Plan  Neoplasm of skin-suspected growing symptomatic cyst L mid upper back paraspinal Skin excision  Lesion length (cm):  1.1 Lesion width (cm):  1.1 Margin per side (cm):  0 Total excision diameter (cm):  1.1 Informed consent: discussed and consent obtained   Timeout: patient name, date of birth, surgical site, and procedure verified   Procedure prep:  Patient was prepped and draped in usual sterile fashion Prep type:  Isopropyl alcohol and povidone-iodine Anesthesia: the lesion was anesthetized in a standard fashion   Anesthetic:  1% lidocaine w/ epinephrine 1-100,000 buffered w/ 8.4% NaHCO3 (5cc lido w/ epi, 3cc bupivicaine, Total of 8cc) Instrument used comment:  #15c blade Hemostasis achieved with: pressure   Hemostasis achieved with comment:  Electrocautery Outcome: patient tolerated procedure well with no complications   Post-procedure details: sterile dressing applied and wound care instructions given   Dressing type: bandage, pressure dressing and bacitracin (Mupirocin)     Skin repair Complexity:  Complex Final length (cm):  1.5 Reason for type of repair: reduce tension to allow closure, reduce the risk of dehiscence, infection, and necrosis, reduce subcutaneous dead space and avoid a hematoma, allow closure of the large defect, preserve normal anatomy, preserve normal anatomical and functional relationships and enhance both functionality and cosmetic results   Undermining: area extensively undermined   Undermining comment:  Undermining Defect 1.1cm Subcutaneous layers (deep stitches):  Suture size:  4-0 Suture type: Vicryl (polyglactin 910)   Subcutaneous suture technique: Inverted Dermal. Fine/surface layer approximation (top stitches):  Suture size:  4-0 Suture type: nylon   Stitches: simple running   Suture removal (days):  7 Hemostasis achieved with: pressure Outcome: patient tolerated procedure well with no complications   Post-procedure details: sterile dressing applied and wound care instructions given   Dressing type: bandage, pressure dressing and bacitracin (Mupirocin)    Specimen 1 - Surgical pathology Differential Diagnosis: D48.5 Cyst vs other  Check Margins: No Cystic pap 1.1cm  Cyst vs other excised today Start Mupirocin oint qd to excision site, pt has prescription at home  Wound dehiscence R post shoulder Skin repair - R post shoulder Complexity:  Simple Final length (cm):  1.5 Informed consent: discussed and consent obtained   Timeout: patient name, date of birth, surgical site, and procedure verified   Procedure prep:  Patient was prepped and draped in usual sterile fashion Prep type:  Povidone-iodine Anesthesia: the lesion was anesthetized in a standard fashion   Anesthetic:  1% lidocaine w/ epinephrine 1-100,000 buffered w/ 8.4% NaHCO3 (1cc lido w/ epi)  Fine/surface layer approximation (top stitches):  Suture size:  4-0 Suture type: nylon   Suture type comment:  Nylon Stitches: horizontal mattress   Suture removal  (days):  7 Hemostasis achieved with: pressure Outcome: patient tolerated procedure well with no complications   Post-procedure details: sterile dressing applied and wound care instructions given   Dressing type: bandage, pressure dressing and bacitracin (Mupirocin)    Return in 1 week (on 06/26/2022) for suture removal.  I, Sonya Hupman, RMA, am acting as scribe for Sarina Ser, MD . Documentation: I have reviewed the above documentation for accuracy and completeness, and I agree with the above.  Sarina Ser, MD

## 2022-06-20 LAB — CYTOLOGY - NON PAP

## 2022-06-22 ENCOUNTER — Encounter: Payer: Self-pay | Admitting: Dermatology

## 2022-06-24 ENCOUNTER — Encounter: Payer: Self-pay | Admitting: Dermatology

## 2022-06-26 ENCOUNTER — Ambulatory Visit: Payer: No Typology Code available for payment source | Admitting: Dermatology

## 2022-06-26 ENCOUNTER — Encounter: Payer: Self-pay | Admitting: Dermatology

## 2022-06-27 ENCOUNTER — Telehealth: Payer: Self-pay

## 2022-06-27 NOTE — Telephone Encounter (Signed)
-----   Message from Ralene Bathe, MD sent at 06/21/2022  1:35 PM EDT ----- Diagnosis Skin (M), left mid upper back paraspinal EPIDERMAL INCLUSION CYST  Benign cyst

## 2022-06-27 NOTE — Telephone Encounter (Signed)
Pt called and left [place] a vm saying she was unable to come to suture removal f/u and a friend took the sutures out. I called pt back and and left a message with the bx results and advised we typically put steri strips on the area so if they did not do that to be careful in the next 2 weeks with stretching that area.  Advised to call if any questions./sh
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
## 2022-07-03 ENCOUNTER — Other Ambulatory Visit: Payer: Self-pay

## 2022-07-03 ENCOUNTER — Encounter: Payer: Self-pay | Admitting: Emergency Medicine

## 2022-07-03 ENCOUNTER — Emergency Department: Payer: No Typology Code available for payment source

## 2022-07-03 ENCOUNTER — Encounter: Payer: Self-pay | Admitting: Orthopedic Surgery

## 2022-07-03 ENCOUNTER — Ambulatory Visit (INDEPENDENT_AMBULATORY_CARE_PROVIDER_SITE_OTHER): Payer: No Typology Code available for payment source | Admitting: Orthopedic Surgery

## 2022-07-03 VITALS — BP 106/76 | Ht 68.0 in | Wt 182.4 lb

## 2022-07-03 DIAGNOSIS — M5030 Other cervical disc degeneration, unspecified cervical region: Secondary | ICD-10-CM

## 2022-07-03 DIAGNOSIS — K8050 Calculus of bile duct without cholangitis or cholecystitis without obstruction: Secondary | ICD-10-CM | POA: Diagnosis not present

## 2022-07-03 DIAGNOSIS — M47812 Spondylosis without myelopathy or radiculopathy, cervical region: Secondary | ICD-10-CM

## 2022-07-03 DIAGNOSIS — R200 Anesthesia of skin: Secondary | ICD-10-CM | POA: Diagnosis not present

## 2022-07-03 DIAGNOSIS — R7401 Elevation of levels of liver transaminase levels: Secondary | ICD-10-CM | POA: Insufficient documentation

## 2022-07-03 DIAGNOSIS — M25511 Pain in right shoulder: Secondary | ICD-10-CM | POA: Diagnosis not present

## 2022-07-03 DIAGNOSIS — K529 Noninfective gastroenteritis and colitis, unspecified: Secondary | ICD-10-CM | POA: Insufficient documentation

## 2022-07-03 DIAGNOSIS — R1011 Right upper quadrant pain: Secondary | ICD-10-CM | POA: Diagnosis present

## 2022-07-03 DIAGNOSIS — R202 Paresthesia of skin: Secondary | ICD-10-CM

## 2022-07-03 LAB — COMPREHENSIVE METABOLIC PANEL
ALT: 57 U/L — ABNORMAL HIGH (ref 0–44)
AST: 33 U/L (ref 15–41)
Albumin: 4.4 g/dL (ref 3.5–5.0)
Alkaline Phosphatase: 90 U/L (ref 38–126)
Anion gap: 9 (ref 5–15)
BUN: 14 mg/dL (ref 6–20)
CO2: 26 mmol/L (ref 22–32)
Calcium: 9.7 mg/dL (ref 8.9–10.3)
Chloride: 103 mmol/L (ref 98–111)
Creatinine, Ser: 0.57 mg/dL (ref 0.44–1.00)
GFR, Estimated: >60 mL/min (ref >60)
Glucose, Bld: 96 mg/dL (ref 70–99)
Potassium: 4.2 mmol/L (ref 3.5–5.1)
Sodium: 138 mmol/L (ref 135–145)
Total Bilirubin: 0.9 mg/dL (ref 0.3–1.2)
Total Protein: 7.5 g/dL (ref 6.5–8.1)

## 2022-07-03 LAB — URINALYSIS, ROUTINE W REFLEX MICROSCOPIC
Bilirubin Urine: NEGATIVE
Glucose, UA: NEGATIVE mg/dL
Hgb urine dipstick: NEGATIVE
Ketones, ur: NEGATIVE mg/dL
Leukocytes,Ua: NEGATIVE
Nitrite: NEGATIVE
Protein, ur: NEGATIVE mg/dL
Specific Gravity, Urine: 1.015 (ref 1.005–1.030)
pH: 5 (ref 5.0–8.0)

## 2022-07-03 LAB — LIPASE, BLOOD: Lipase: 31 U/L (ref 11–51)

## 2022-07-03 LAB — CBC
HCT: 40.4 % (ref 36.0–46.0)
Hemoglobin: 13.1 g/dL (ref 12.0–15.0)
MCH: 28.9 pg (ref 26.0–34.0)
MCHC: 32.4 g/dL (ref 30.0–36.0)
MCV: 89 fL (ref 80.0–100.0)
Platelets: 264 10*3/uL (ref 150–400)
RBC: 4.54 MIL/uL (ref 3.87–5.11)
RDW: 13.7 % (ref 11.5–15.5)
WBC: 6.5 10*3/uL (ref 4.0–10.5)
nRBC: 0 % (ref 0.0–0.2)

## 2022-07-03 NOTE — ED Triage Notes (Signed)
RUQ pain started last night. Nausea without vomiting or diarrhea.

## 2022-07-03 NOTE — ED Provider Triage Note (Signed)
Emergency Medicine Provider Triage Evaluation Note  Taya Ashbaugh, a 51 y.o. female  was evaluated in triage.  Pt complains of RUQ with associated NV.  Review of Systems  Positive: RUQ abd pain, NV Negative: FCS  Physical Exam  BP 120/81 (BP Location: Right Arm)   Pulse 75   Temp (!) 97.5 F (36.4 C)   Resp 18   Ht '5\' 8"'$  (1.727 m)   Wt 81.6 kg   SpO2 98%   BMI 27.37 kg/m  Gen:   Awake, no distress  NAD Resp:  Normal effort CTA MSK:   Moves extremities without difficulty  ABD:  Soft, mildly tender to palp RUQ  Medical Decision Making  Medically screening exam initiated at 9:12 PM.  Appropriate orders placed.  Nashia Remus was informed that the remainder of the evaluation will be completed by another provider, this initial triage assessment does not replace that evaluation, and the importance of remaining in the ED until their evaluation is complete.  Patient the for evaluation of right upper quad abdominal pain with nausea vomiting with onset last night.   Melvenia Needles, PA-C 07/03/22 2115

## 2022-07-03 NOTE — ED Notes (Signed)
EKG completed and given to EDP.  

## 2022-07-04 ENCOUNTER — Emergency Department: Payer: No Typology Code available for payment source

## 2022-07-04 ENCOUNTER — Emergency Department
Admission: EM | Admit: 2022-07-04 | Discharge: 2022-07-04 | Disposition: A | Discharge status: Home / Self Care | Payer: No Typology Code available for payment source | Attending: Emergency Medicine | Admitting: Emergency Medicine

## 2022-07-04 DIAGNOSIS — R1011 Right upper quadrant pain: Secondary | ICD-10-CM

## 2022-07-04 DIAGNOSIS — K8050 Calculus of bile duct without cholangitis or cholecystitis without obstruction: Secondary | ICD-10-CM

## 2022-07-04 DIAGNOSIS — K529 Noninfective gastroenteritis and colitis, unspecified: Secondary | ICD-10-CM

## 2022-07-04 MED ORDER — ONDANSETRON 4 MG PO TBDP: ORAL_TABLET | ORAL | 0 refills | Status: DC

## 2022-07-04 MED ORDER — IOHEXOL 350 MG/ML SOLN
100.0000 mL | Freq: Once | INTRAVENOUS | Status: AC | PRN
  Administered 2022-07-04: 100 mL via INTRAVENOUS

## 2022-07-04 MED ORDER — MORPHINE SULFATE (PF) 4 MG/ML IV SOLN
4.0000 mg | Freq: Once | INTRAVENOUS | Status: AC
  Administered 2022-07-04: 4 mg via INTRAVENOUS
  Filled 2022-07-04: qty 1

## 2022-07-04 MED ORDER — ONDANSETRON HCL 4 MG/2ML IJ SOLN
4.0000 mg | INTRAMUSCULAR | Status: AC
  Administered 2022-07-04: 4 mg via INTRAVENOUS
  Filled 2022-07-04: qty 2

## 2022-07-04 NOTE — ED Provider Notes (Signed)
[facility] Provider Note    Event Date/Time   First MD Initiated Contact with Patient 07/04/22 0009     (approximate)   History   Abdominal Pain   HPI  Michelle Norris is a 51 y.o. female who presents for evaluation of pain in her right upper quadrant.  She says that it started last night (more than 24 hours ago ) and has been intermittent but relatively consistent at this point.  It was extremely severe earlier, very sharp and unrelenting and nothing in particular she did make it better or worse.  Over the course of the day to gotten better but it seemed to get worse again when she ate.  Some times the pain is bad and if it takes her breath away but she does not feel short of breath.  She has not had any recent fever or chills.  She has had some nausea but no vomiting and she has had no diarrhea nor constipation; she had a normal bowel movement earlier today.  No similar episodes in the past.  Of note, she has had a Roux-en-Y bypass surgery a couple of years ago but has never had symptoms like this.     Physical Exam   Triage Vital Signs: ED Triage Vitals  Enc Vitals Group     BP 07/03/22 1637 120/81     Pulse Rate 07/03/22 1637 75     Resp 07/03/22 1637 18     Temp 07/03/22 1637 (!) 97.5 F (36.4 C)     Temp Source 07/03/22 2149 Oral     SpO2 07/03/22 1637 98 %     Weight 07/03/22 1633 81.6 kg (180 lb)     Height 07/03/22 1633 1.727 m ('5\' 8"'$ )     Head Circumference --      Peak Flow --      Pain Score 07/03/22 1632 10     Pain Loc --      Pain Edu? --      Excl. in [place]? --     Most recent vital signs: Vitals:   07/04/22 0330 07/04/22 0400  BP: 115/80 112/69  Pulse: (!) 57 61  Resp:  14  Temp:  97.8 F (36.6 C)  SpO2: 98% 96%     General: Awake, no distress.  CV:  Good peripheral perfusion.  Resp:  Normal effort.  Lungs are clear to auscultation. Abd:  No distention.  No tenderness to palpation in the lower part of her abdomen.  She  has very minimal tenderness in the epigastrium and moderate right upper quadrant tenderness with positive Murphy sign. Other:  Awake and alert, appropriate mood and affect under the circumstances.   ED Results / Procedures / Treatments   Labs (all labs ordered are listed, but only abnormal results are displayed) Labs Reviewed  COMPREHENSIVE METABOLIC PANEL - Abnormal; Notable for the following components:      Result Value   ALT 57 (*)    All other components within normal limits  URINALYSIS, ROUTINE W REFLEX MICROSCOPIC - Abnormal; Notable for the following components:   Color, Urine YELLOW (*)    APPearance HAZY (*)    All other components within normal limits  LIPASE, BLOOD  CBC     EKG  ED ECG REPORT I, Hinda Kehr, the attending physician, personally viewed and interpreted this ECG.  Date: 07/03/2022 EKG Time: 21: 04 Rate: 64 Rhythm: normal sinus rhythm QRS Axis: normal Intervals: normal ST/T Wave abnormalities:  normal Narrative Interpretation: no evidence of acute ischemia    RADIOLOGY I viewed and interpreted the patient's abdominal ultrasound.  I saw no evidence of cholecystitis, such as pericholecystic fluids or cholelithiasis or gallbladder wall thickening.  The radiologist commented that the gallbladder appears distended but without cholecystitis.  I also viewed and interpreted the patient's CTA chest and CT abdomen and pelvis.  I see no evidence of pulmonary embolism, pneumonia, SBO/ileus.  The radiologist commented that there are no acute findings but did comment that there is some skin thickening on the left breast that should be evaluated with mammogram.    PROCEDURES:  Critical Care performed: No  Procedures   MEDICATIONS ORDERED IN ED: Medications  morphine (PF) 4 MG/ML injection 4 mg (4 mg Intravenous Given 07/04/22 0158)  ondansetron (ZOFRAN) injection 4 mg (4 mg Intravenous Given 07/04/22 0159)  iohexol (OMNIPAQUE) 350 MG/ML injection 100 mL  (100 mLs Intravenous Contrast Given 07/04/22 0215)     IMPRESSION / MDM / ASSESSMENT AND PLAN / ED COURSE  I reviewed the triage vital signs and the nursing notes.                              Differential diagnosis includes, but is not limited to, biliary colic, cholecystitis, gastritis, SBO/ileus, pulmonary embolism.  Patient's presentation is most consistent with acute presentation with potential threat to life or bodily function.  Initial Labs/studies ordered: EKG, ultrasound of the right upper quadrant of the abdomen, CBC, urinalysis, lipase, comprehensive metabolic panel.  Labs are all essentially normal.  She has a very slight elevation of her ALT which she says is always the case.  Otherwise all labs are within normal limits.  Ultrasound of the abdomen, as described above, shows a distended gallbladder but without evidence of cholecystitis.  EKG shows no sign of ischemia.  By the time I evaluated the patient, she was essentially pain-free and said she was ready to go home.  We talked about it and I agreed to provide follow-up information with general surgery as she is most likely experiencing biliary colic.  However, when she got up and started moving around, her pain came back and was again severe.  I ordered a peripheral IV, morphine 4 mg IV, Zofran 4 mg IV.  Given that she could be having right lower lobe pulmonary emboli causing similar pain (she was then splinting, either from the pain or due to shortness of breath), as well as the possibility of SBO/ileus particularly in the setting of Roux-en-Y bypass surgery, I ordered CTA chest and CT abdomen/pelvis to rule out any emergent and/or surgical issues.  The patient's pain was well controlled with medications provided.  As documented above, no emergent findings were identified on the scans and she felt well enough to go home.  We again reiterated the plan to follow-up with general surgery.  I told her about the abnormal finding on  the skin of her left breast and she already has a mammogram scheduled in 3 months but will contact her PCP about getting an earlier appointment if possible.  She says she already has some pain medicine at home from her prior surgery but I wrote her prescription for Zofran.  I gave my usual and customary follow-up recommendations and return precautions and she agrees with the plan.     FINAL CLINICAL IMPRESSION(S) / ED DIAGNOSES   Final diagnoses:  RUQ pain  Biliary colic  Enteritis  Rx / DC Orders   ED Discharge Orders          Ordered    ondansetron (ZOFRAN-ODT) 4 MG disintegrating tablet        07/04/22 0114             Note:  This document was prepared using Dragon voice recognition software and may include unintentional dictation errors.   Hinda Kehr, MD 07/04/22 [id number]

## 2022-07-04 NOTE — ED Notes (Signed)
Patient transported to CT 

## 2022-07-04 NOTE — Discharge Instructions (Addendum)
As we discussed, we did not identify a specific cause of your discomfort.  It may be the result of your gallbladder (called biliary colic), but you do not have cholecystitis at this time.  We recommend you follow-up with Dr. Lysle Pearl or one of his general surgery colleagues to discuss whether or not you may benefit from having her gallbladder removed as an outpatient.  Your CT scan showed that you have a little bit of enteritis, which is inflammation of part of your small intestine, and this generally resolves on its own.  The only other finding that may be worth talking with your primary care doctor about was that there is some skin thickening on your left breast.  The radiologist recommend that you follow-up as an outpatient with a mammogram.  You may want to talk with your PCP about trying to schedule it for sooner than December just so that you have all the information needed as soon as possible.    Return to the emergency department if you develop new or worsening symptoms that concern you.

## 2022-07-12 ENCOUNTER — Other Ambulatory Visit: Payer: Self-pay | Admitting: Physician Assistant

## 2022-07-12 DIAGNOSIS — R234 Changes in skin texture: Secondary | ICD-10-CM

## 2022-07-12 DIAGNOSIS — N6342 Unspecified lump in left breast, subareolar: Secondary | ICD-10-CM

## 2022-07-17 ENCOUNTER — Ambulatory Visit (INDEPENDENT_AMBULATORY_CARE_PROVIDER_SITE_OTHER): Payer: No Typology Code available for payment source | Admitting: Dermatology

## 2022-07-17 ENCOUNTER — Telehealth: Payer: Self-pay

## 2022-07-17 ENCOUNTER — Ambulatory Visit: Payer: Self-pay | Admitting: Surgery

## 2022-07-17 DIAGNOSIS — T8130XA Disruption of wound, unspecified, initial encounter: Secondary | ICD-10-CM

## 2022-07-17 DIAGNOSIS — D492 Neoplasm of unspecified behavior of bone, soft tissue, and skin: Secondary | ICD-10-CM

## 2022-07-17 DIAGNOSIS — L720 Epidermal cyst: Secondary | ICD-10-CM

## 2022-07-17 NOTE — Progress Notes (Signed)
   Follow-Up Visit   Subjective  Michelle Norris is a 51 y.o. female who presents for the following: cyst vs other (R axilla, pt presents for excision) and Wound Dehiscence (R post shoulder, repaired on 06/19/2022, pt feels like it is not healing).  The following portions of the chart were reviewed this encounter and updated as appropriate:   Tobacco  Allergies  Meds  Problems  Med Hx  Surg Hx  Fam Hx     Review of Systems:  No other skin or systemic complaints except as noted in HPI or Assessment and Plan.  Objective  Well appearing patient in no apparent distress; mood and affect are within normal limits.  A focused examination was performed including right axilla. Relevant physical exam findings are noted in the Assessment and Plan.  Right Axilla Cystic pap 1.1cm  R post shoulder Spitting suture   Assessment & Plan  Neoplasm of skin Right Axilla  Skin excision  Lesion length (cm):  1.1 Lesion width (cm):  1.1 Margin per side (cm):  0 Total excision diameter (cm):  1.1 Informed consent: discussed and consent obtained   Timeout: patient name, date of birth, surgical site, and procedure verified   Procedure prep:  Patient was prepped and draped in usual sterile fashion Prep type:  Isopropyl alcohol and povidone-iodine Anesthesia: the lesion was anesthetized in a standard fashion   Anesthetic:  1% lidocaine w/ epinephrine 1-100,000 buffered w/ 8.4% NaHCO3 Instrument used comment:  #15c blade Hemostasis achieved with: pressure   Hemostasis achieved with comment:  Electrocautery Outcome: patient tolerated procedure well with no complications   Post-procedure details: sterile dressing applied and wound care instructions given   Dressing type: bandage, pressure dressing and bacitracin (Mupirocin)    Skin repair Complexity:  Complex Final length (cm):  2 Reason for type of repair: reduce tension to allow closure, reduce the risk of dehiscence, infection, and necrosis,  reduce subcutaneous dead space and avoid a hematoma, allow closure of the large defect, preserve normal anatomy, preserve normal anatomical and functional relationships and enhance both functionality and cosmetic results   Undermining: area extensively undermined   Undermining comment:  Undermining Defect 1.1cm Subcutaneous layers (deep stitches):  Suture size:  4-0 Suture type: Vicryl (polyglactin 910)   Subcutaneous suture technique: Inverted Dermal. Fine/surface layer approximation (top stitches):  Suture size:  4-0 Suture type: nylon   Stitches: horizontal mattress, simple interrupted and simple running   Stitches comment:  3 sutures, 1 horizontal mattress and 2 simple interrupted Suture removal (days):  7 Hemostasis achieved with: suture and pressure Outcome: patient tolerated procedure well with no complications   Post-procedure details: sterile dressing applied and wound care instructions given   Dressing type: bandage, pressure dressing and bacitracin (Mupirocin)    Specimen 1 - Surgical pathology Differential Diagnosis: D48.5 Cyst vs other  Check Margins: No Cystic pap 1.1cm  Cyst vs other, excised today Start Mupirocin oint qd to excision site  Spitting suture, initial encounter R post shoulder  Partial spitting suture removed today, recheck on f/u   Return in about 1 week (around 07/24/2022) for suture removal, recheck spitting suture R post shoulder.  I, Othelia Pulling, RMA, am acting as scribe for Sarina Ser, MD . Documentation: I have reviewed the above documentation for accuracy and completeness, and I agree with the above.  Sarina Ser, MD

## 2022-07-17 NOTE — Telephone Encounter (Signed)
Pt doing well after todays surgery./sh 

## 2022-07-17 NOTE — Patient Instructions (Signed)
Wound Care Instructions  On the day following your surgery, you should begin doing daily dressing changes: Remove the old dressing and discard it. Cleanse the wound gently with tap water. This may be done in the shower or by placing a wet gauze pad directly on the wound and letting it soak for several minutes. It is important to gently remove any dried blood from the wound in order to encourage healing. This may be done by gently rolling a moistened Q-tip on the dried blood. Do not pick at the wound. If the wound should start to bleed, continue cleaning the wound, then place a moist gauze pad on the wound and hold pressure for a few minutes.  Make sure you then dry the skin surrounding the wound completely or the tape will not stick to the skin. Do not use cotton balls on the wound. After the wound is clean and dry, apply the ointment gently with a Q-tip. Cut a non-stick pad to fit the size of the wound. Lay the pad flush to the wound. If the wound is draining, you may want to reinforce it with a small amount of gauze on top of the non-stick pad for a little added compression to the area. Use the tape to seal the area completely. Select from the following with respect to your individual situation: If your wound has been stitched closed: continue the above steps 1-8 at least daily until your sutures are removed. If your wound has been left open to heal: continue steps 1-8 at least daily for the first 3-4 weeks. We would like for you to take a few extra precautions for at least the next week. Sleep with your head elevated on pillows if our wound is on your head. Do not bend over or lift heavy items to reduce the chance of elevated blood pressure to the wound Do not participate in particularly strenuous activities.   Below is a list of dressing supplies you might need.  Cotton-tipped applicators - Q-tips Gauze pads (2x2 and/or 4x4) - All-Purpose Sponges Non-stick dressing material - Telfa Tape -  Paper or Hypafix New and clean tube of petroleum jelly - Vaseline    Comments on Post-Operative Period Slight swelling and redness often appear around the wound. This is normal and will disappear within several days following the surgery. The healing wound will drain a brownish-red-yellow discharge during healing. This is a normal phase of wound healing. As the wound begins to heal, the drainage may increase in amount. Again, this drainage is normal. Notify us if the drainage becomes persistently bloody, excessively swollen, or intensely painful or develops a foul odor or red streaks.  If you should experience mild discomfort during the healing phase, you may take an aspirin-free medication such as Tylenol (acetaminophen). Notify us if the discomfort is severe or persistent. Avoid alcoholic beverages when taking pain medicine.  In Case of Wound Hemorrhage A wound hemorrhage is when the bandage suddenly becomes soaked with bright red blood and flows profusely. If this happens, sit down or lie down with your head elevated. If the wound has a dressing on it, do not remove the dressing. Apply pressure to the existing gauze. If the wound is not covered, use a gauze pad to apply pressure and continue applying the pressure for 20 minutes without peeking. DO NOT COVER THE WOUND WITH A LARGE TOWEL OR WASH CLOTH. Release your hand from the wound site but do not remove the dressing. If the bleeding has stopped,   gently clean around the wound. Leave the dressing in place for 24 hours if possible. This wait time allows the blood vessels to close off so that you do not spark a new round of bleeding by disrupting the newly clotted blood vessels with an immediate dressing change. If the bleeding does not subside, continue to hold pressure. If matters are out of your control, contact an After Hours clinic or go to the Emergency Room.

## 2022-07-17 NOTE — H&P (Signed)
Subjective:   CC: Biliary colic [P71.06]  HPI:  Michelle Norris is a 51 y.o. female who was referred by Karma Greaser for evaluation of above CC. Symptoms were first noted several years ago. Intermittent. Pain is sharp, confined to the right upper quadrant, without radiation.  Most recent episode longer and severe enough o warrant ED visit.  Associated with nausea, exacerbated by nothing specific, woke her up at night.     Past Medical History:  has a past medical history of Allergy (1975), Arthritis, B12 deficiency (10/10/2017), Cardiomyopathy (CMS-HCC) (10/10/2017), Cerebellar infarction (CMS-HCC) (11/12/2017), Change in bowel habits (07/02/2019), CHF (congestive heart failure) (CMS-HCC), Chickenpox, Chronic a-fib (CMS-HCC) (10/10/2017), Chronic migraine without aura without status migrainosus, not intractable (10/10/2017), Essential hypertension (10/10/2017), History of stroke, Hyperlipemia, mixed (10/10/2017), Obstructive sleep apnea (10/10/2017), and Sleep apnea.  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  Family History: family history includes ALS in her maternal grandmother; Alzheimer's disease in her maternal grandmother; Cancer in her maternal grandmother; Coronary Artery Disease (Blocked arteries around heart) in her father; Deep vein thrombosis (DVT or abnormal blood clot formation) in her paternal  grandmother; Dementia in her maternal grandmother; Diabetes in her father, mother, and paternal grandmother; Diabetes type II in her maternal grandmother; High blood pressure (Hypertension) in her father; Hyperlipidemia (Elevated cholesterol) in her father and paternal grandmother; Migraines in her brother, daughter, father, maternal grandmother, mother, and sister; Myocardial Infarction (Heart attack) in her paternal grandmother; Osteoporosis (Thinning of bones) in her maternal grandmother; Stroke in her maternal grandfather and paternal grandmother.  Social History:  reports that she has never smoked. She has never used smokeless tobacco. She reports that she does not currently use alcohol. She reports that she does not use drugs.  Current Medications: has a current medication list which includes the following prescription(s): acetaminophen, atorvastatin, biotin, calcium carb/magnesium hydrox, calcium, clopidogrel, collagen, cyanocobalamin, diphenhydramine, estradiol, ajovy autoinjector, gabapentin, linzess, magnesium oxide, metoprolol tartrate, multivitamin, nurtec odt, nystatin, vitamin a, ergocalciferol (vitamin d2), and metronidazole.  Allergies:  Allergies as of 07/17/2022 - Reviewed 07/17/2022  Allergen Reaction Noted   Adhesive Rash 08/16/2020   Wasp venom Shortness Of Breath and Swelling 01/16/2017   Amoxicillin Other (See Comments) 08/21/2018   Oxycodone Itching 08/16/2020   Latex Hives and Rash 01/16/2017    ROS:  A 15 point review of systems was performed and pertinent positives and negatives noted in HPI    Objective:     BP 105/63   Pulse 61   Ht 172.7 cm ('5\' 8"'$ )   Wt 82.6 kg (182 lb)   BMI 27.67 kg/m    Constitutional :  No distress, cooperative, alert  Lymphatics/Throat:  Supple with no lymphadenopathy  Respiratory:  Clear to auscultation bilaterally  Cardiovascular:  Regular rate and rhythm  Gastrointestinal: Soft, non-tender, non-distended, no organomegaly.   Musculoskeletal: Steady gait and movement  Skin: Cool and moist, lap surgical scars  Psychiatric: Normal affect, non-agitated, not confused       LABS:  N/a   RADS: CLINICAL DATA:  51 year old female with nausea and right  upper quadrant abdominal pain; PE suspected   EXAM: CT ANGIOGRAPHY CHEST   CT ABDOMEN AND PELVIS WITH CONTRAST   TECHNIQUE: Multidetector CT imaging of the chest was performed using the standard protocol during bolus administration of intravenous contrast. Multiplanar CT image reconstructions and MIPs were obtained to evaluate the vascular anatomy. Multidetector CT imaging of the abdomen and pelvis was performed using the standard protocol during bolus administration of intravenous contrast.   RADIATION DOSE REDUCTION: This exam was performed according to the departmental dose-optimization program which includes automated exposure control, adjustment of the mA and/or kV according to patient size and/or use of iterative reconstruction technique.   CONTRAST:  162m OMNIPAQUE IOHEXOL 350 MG/ML SOLN   COMPARISON:  Abdominal ultrasound 07/03/2022 and CT abdomen and pelvis 12/15/2021   CT chest angiogram 10/17/2017   FINDINGS: CTA CHEST FINDINGS   Cardiovascular: Satisfactory opacification of the pulmonary arteries to the segmental level. No evidence of pulmonary embolism. Mild cardiomegaly. No pericardial effusion. Right chest wall pacemaker. Left atrial appendage occlusion device.   Mediastinum/Nodes: No enlarged mediastinal, hilar, or axillary lymph nodes. Nodular left thyroid lobe previously evaluated with ultrasound. Unremarkable esophagus.   Lungs/Pleura: Lungs are clear. No pleural effusion or pneumothorax.   Upper Abdomen: No acute abnormality.   Musculoskeletal: Skin thickening and subareolar soft tissue mass in the left breast.   Review of the MIP images confirms the above findings.   CT ABDOMEN and PELVIS FINDINGS   Hepatobiliary:  No focal liver abnormality is seen. No gallstones, gallbladder wall thickening, or biliary dilatation.   Pancreas: Unremarkable. No pancreatic ductal dilatation or surrounding inflammatory changes.   Spleen: Normal in size without focal abnormality.   Adrenals/Urinary Tract: Adrenal glands are unremarkable. Low-attenuation lesions in the kidneys are statistically likely to represent cysts. No follow-up is required. No urinary calculi or hydronephrosis. Bladder is unremarkable.   Stomach/Bowel: Postoperative changes of Roux-en-Y gastric bypass. Mild thickening of a few loops of jejunum in the left upper quadrant may be due to decompression or enteritis. No significant adjacent stranding. Normal caliber large and small bowel. There are a few colonic diverticula without diverticulitis. Unchanged mild wall thickening about the cecum. Normal appendix.   Vascular/Lymphatic: Mild aortic atherosclerotic calcification. Decreased prominence of ileo colic lymph nodes in the right lower quadrant.   Reproductive: Status post hysterectomy. No adnexal masses.   Other: No free intraperitoneal air. Trace free fluid in the pelvis is likely physiologic.   Musculoskeletal: No acute osseous abnormality.   Review of the MIP images confirms the above findings.   IMPRESSION: CHEST:   1. Negative for acute pulmonary embolism. 2. Mild cardiomegaly. 3. Skin thickening and subareolar mass in the left breast. Recommend further evaluation with mammography.   ABDOMEN/PELVIS:   1. Mild wall thickening in the jejunum may be due to decompression or mild enteritis. 2. Otherwise no acute abnormality in the abdomen or pelvis. 3.  Aortic Atherosclerosis (ICD10-I70.0).     Electronically Signed   By: TPlacido SouM.D.   On: 07/04/2022 02:43  CLINICAL DATA:  Right upper quadrant abdominal pain.   EXAM: ULTRASOUND ABDOMEN LIMITED RIGHT UPPER QUADRANT   COMPARISON:  None Available.    FINDINGS: Gallbladder:   Mild gallbladder distension but no gallstones, wall thickening or pericholecystic fluid. No sonographic Murphy sign.   Common bile duct:   Diameter: 2.4 mm   Liver:   Normal echogenicity without focal lesion or biliary dilatation. Portal vein is patent on color Doppler imaging with normal direction of  blood flow towards the liver.   Other: None.   IMPRESSION: 1. Mild gallbladder distension but no gallstones or findings for acute cholecystitis. 2. Normal caliber common bile duct. 3. Normal liver.     Electronically Signed   By: Marijo Sanes M.D.   On: 07/03/2022 21:41     Assessment:      Biliary colic [L38.10], classic presentation even with absence of visible stones in a dilated gallbladder, so will proceed with lap chole to prevent recurrence.  Plan:     1. Biliary colic [F75.10] Discussed the risk of surgery including post-op infxn, seroma, biloma, chronic pain, poor-delayed wound healing, retained gallstone, conversion to open procedure, post-op SBO or ileus, and need for additional procedures to address said risks.  The risks of general anesthetic including MI, CVA, sudden death or even reaction to anesthetic medications also discussed. Alternatives include continued observation.  Benefits include possible symptom relief, prevention of complications including acute cholecystitis, pancreatitis.  Typical post operative recovery of 3-5 days rest, continued pain in area and incision sites, possible loose stools up to 4-6 weeks, also discussed.  ED return precautions given for sudden increase in RUQ pain, with possible accompanying fever, nausea, and/or vomiting.  The patient understands the risks, any and all questions were answered to the patient's satisfaction.  2. Patient has elected to proceed with surgical treatment. Procedure will be scheduled.  Written consent was obtained.robotic assisted laparoscopic.  Of note, pt also pending  workup for incidental breast mass on CT.  Scheduled for mammogram, will f/u if anything concerning.  labs/images/medications/previous chart entries reviewed personally and relevant changes/updates noted above.

## 2022-07-24 ENCOUNTER — Encounter: Payer: Self-pay | Admitting: Dermatology

## 2022-07-24 ENCOUNTER — Ambulatory Visit (INDEPENDENT_AMBULATORY_CARE_PROVIDER_SITE_OTHER): Payer: No Typology Code available for payment source | Admitting: Dermatology

## 2022-07-24 DIAGNOSIS — L719 Rosacea, unspecified: Secondary | ICD-10-CM | POA: Diagnosis not present

## 2022-07-24 DIAGNOSIS — Z4802 Encounter for removal of sutures: Secondary | ICD-10-CM

## 2022-07-24 DIAGNOSIS — L720 Epidermal cyst: Secondary | ICD-10-CM

## 2022-07-24 MED ORDER — IVERMECTIN 1 % EX CREA: 1.0000 | TOPICAL_CREAM | Freq: Every day | CUTANEOUS | 6 refills | Status: DC

## 2022-07-24 NOTE — Progress Notes (Signed)
   Follow-Up Visit   Subjective  Michelle Norris is a 51 y.o. female who presents for the following: Suture / Staple Removal (Right axilla biopsy proven Epidermal cyst 07/17/2022). Refill Soolantra for Rosacea on her face  The following portions of the chart were reviewed this encounter and updated as appropriate:   Tobacco  Allergies  Meds  Problems  Med Hx  Surg Hx  Fam Hx     Review of Systems:  No other skin or systemic complaints except as noted in HPI or Assessment and Plan.  Objective  Well appearing patient in no apparent distress; mood and affect are within normal limits.  A focused examination was performed including right axilla. Relevant physical exam findings are noted in the Assessment and Plan.  Right Axilla Well healed scar   Head - Anterior (Face) Mid face erythema with telangiectasias     Assessment & Plan  Epidermal inclusion cyst Right Axilla Biopsy results discussed  Encounter for Removal of Sutures - Incision site at the right axilla is clean, dry and intact - Wound cleansed, sutures removed, - Discussed pathology results showing Epidermal cyst  - Scars remodel for a full year. - patient can apply over-the-counter silicone scar cream each night to help with scar remodeling if desired. - Patient advised to call with any concerns or if they notice any new or changing lesions.   Rosacea Head - Anterior (Face) Rosacea is a chronic progressive skin condition usually affecting the face of adults, causing redness and/or acne bumps. It is treatable but not curable. It sometimes affects the eyes (ocular rosacea) as well. It may respond to topical and/or systemic medication and can flare with stress, sun exposure, alcohol, exercise, topical steroids (including hydrocortisone/cortisone 10) and some foods.  Daily application of broad spectrum spf 30+ sunscreen to face is recommended to reduce flares.   Continue Soolantra cream apply to face daily.  Related  Medications Ivermectin (SOOLANTRA) 1 % CREA Apply 1 Application topically at bedtime.  Return in about 1 year (around 07/25/2023) for TBSE, hx of AKs, Rosacea .  IMarye Round, CMA, am acting as scribe for Sarina Ser, MD .  Documentation: I have reviewed the above documentation for accuracy and completeness, and I agree with the above.  Sarina Ser, MD

## 2022-07-24 NOTE — Patient Instructions (Signed)
Due to recent changes in healthcare laws, you may see results of your pathology and/or laboratory studies on MyChart before the doctors have had a chance to review them. We understand that in some cases there may be results that are confusing or concerning to you. Please understand that not all results are received at the same time and often the doctors may need to interpret multiple results in order to provide you with the best plan of care or course of treatment. Therefore, we ask that you please give us 2 business days to thoroughly review all your results before contacting the office for clarification. Should we see a critical lab result, you will be contacted sooner.   If You Need Anything After Your Visit  If you have any questions or concerns for your doctor, please call our main line at [phone] and press option 4 to reach your doctor's medical assistant. If no one answers, please leave a voicemail as directed and we will return your call as soon as possible. Messages left after 4 pm will be answered the following business day.   You may also send us a message via MyChart. We typically respond to MyChart messages within 1-2 business days.  For prescription refills, please ask your pharmacy to contact our office. Our fax number is [phone].  If you have an urgent issue when the clinic is closed that cannot wait until the next business day, you can page your doctor at the number below.    Please note that while we do our best to be available for urgent issues outside of office hours, we are not available 24/7.   If you have an urgent issue and are unable to reach us, you may choose to seek medical care at your doctor's office, retail clinic, urgent care center, or emergency room.  If you have a medical emergency, please immediately call 911 or go to the emergency department.  Pager Numbers  - Dr. Kowalski: [phone]  - Dr. Moye: [phone]  - Dr. Stewart:  [phone]  In the event of inclement weather, please call our main line at [phone] for an update on the status of any delays or closures.  Dermatology Medication Tips: Please keep the boxes that topical medications come in in order to help keep track of the instructions about where and how to use these. Pharmacies typically print the medication instructions only on the boxes and not directly on the medication tubes.   If your medication is too expensive, please contact our office at [phone] option 4 or send us a message through MyChart.   We are unable to tell what your co-pay for medications will be in advance as this is different depending on your insurance coverage. However, we may be able to find a substitute medication at lower cost or fill out paperwork to get insurance to cover a needed medication.   If a prior authorization is required to get your medication covered by your insurance company, please allow us 1-2 business days to complete this process.  Drug prices often vary depending on where the prescription is filled and some pharmacies may offer cheaper prices.  The website www.goodrx.com contains coupons for medications through different pharmacies. The prices here do not account for what the cost may be with help from insurance (it may be cheaper with your insurance), but the website can give you the price if you did not use any insurance.  - You can print the associated coupon and take it with   your prescription to the pharmacy.  - You may also stop by our office during regular business hours and pick up a GoodRx coupon card.  - If you need your prescription sent electronically to a different pharmacy, notify our office through [facility] MyChart or by phone at [phone] option 4.     Si Usted Necesita Algo Despus de Su Visita  Tambin puede enviarnos un mensaje a travs de MyChart. Por lo general respondemos a los mensajes de MyChart en el transcurso de 1 a 2  das hbiles.  Para renovar recetas, por favor pida a su farmacia que se ponga en contacto con nuestra oficina. Nuestro nmero de fax es el [phone].  Si tiene un asunto urgente cuando la clnica est cerrada y que no puede esperar hasta el siguiente da hbil, puede llamar/localizar a su doctor(a) al nmero que aparece a continuacin.   Por favor, tenga en cuenta que aunque hacemos todo lo posible para estar disponibles para asuntos urgentes fuera del horario de oficina, no estamos disponibles las 24 horas del da, los 7 das de la semana.   Si tiene un problema urgente y no puede comunicarse con nosotros, puede optar por buscar atencin mdica  en el consultorio de su doctor(a), en una clnica privada, en un centro de atencin urgente o en una sala de emergencias.  Si tiene una emergencia mdica, por favor llame inmediatamente al 911 o vaya a la sala de emergencias.  Nmeros de bper  - Dr. Kowalski: [phone]  - Dra. Moye: [phone]  - Dra. Stewart: [phone]  En caso de inclemencias del tiempo, por favor llame a nuestra lnea principal al [phone] para una actualizacin sobre el estado de cualquier retraso o cierre.  Consejos para la medicacin en dermatologa: Por favor, guarde las cajas en las que vienen los medicamentos de uso tpico para ayudarle a seguir las instrucciones sobre dnde y cmo usarlos. Las farmacias generalmente imprimen las instrucciones del medicamento slo en las cajas y no directamente en los tubos del medicamento.   Si su medicamento es muy caro, por favor, pngase en contacto con nuestra oficina llamando al [phone] y presione la opcin 4 o envenos un mensaje a travs de MyChart.   No podemos decirle cul ser su copago por los medicamentos por adelantado ya que esto es diferente dependiendo de la cobertura de su seguro. Sin embargo, es posible que podamos encontrar un medicamento sustituto a menor costo o llenar un formulario para que el  seguro cubra el medicamento que se considera necesario.   Si se requiere una autorizacin previa para que su compaa de seguros cubra su medicamento, por favor permtanos de 1 a 2 das hbiles para completar este proceso.  Los precios de los medicamentos varan con frecuencia dependiendo del lugar de dnde se surte la receta y alguna farmacias pueden ofrecer precios ms baratos.  El sitio web www.goodrx.com tiene cupones para medicamentos de diferentes farmacias. Los precios aqu no tienen en cuenta lo que podra costar con la ayuda del seguro (puede ser ms barato con su seguro), pero el sitio web puede darle el precio si no utiliz ningn seguro.  - Puede imprimir el cupn correspondiente y llevarlo con su receta a la farmacia.  - Tambin puede pasar por nuestra oficina durante el horario de atencin regular y recoger una tarjeta de cupones de GoodRx.  - Si necesita que su receta se enve electrnicamente a una farmacia diferente, informe a nuestra oficina a travs de MyChart de [facility]   o por telfono llamando al [phone] y presione la opcin 4.  

## 2022-07-30 ENCOUNTER — Other Ambulatory Visit: Payer: Self-pay | Admitting: Orthopedic Surgery

## 2022-08-01 ENCOUNTER — Ambulatory Visit
Admission: RE | Admit: 2022-08-01 | Discharge: 2022-08-01 | Disposition: A | Discharge status: Home / Self Care | Payer: No Typology Code available for payment source | Source: Ambulatory Visit | Attending: Physician Assistant | Admitting: Physician Assistant

## 2022-08-01 DIAGNOSIS — N6342 Unspecified lump in left breast, subareolar: Secondary | ICD-10-CM | POA: Diagnosis present

## 2022-08-01 DIAGNOSIS — R234 Changes in skin texture: Secondary | ICD-10-CM | POA: Diagnosis present

## 2022-08-03 ENCOUNTER — Encounter: Payer: Self-pay | Admitting: Dermatology

## 2022-08-07 HISTORY — PX: INCISION AND DRAINAGE ABSCESS: SHX5864

## 2022-08-16 ENCOUNTER — Inpatient Hospital Stay: Admission: RE | Admit: 2022-08-16 | Payer: No Typology Code available for payment source | Source: Ambulatory Visit

## 2022-08-21 ENCOUNTER — Encounter
Admission: RE | Admit: 2022-08-21 | Discharge: 2022-08-21 | Disposition: A | Discharge status: Home / Self Care | Payer: No Typology Code available for payment source | Source: Ambulatory Visit | Attending: Orthopedic Surgery | Admitting: Orthopedic Surgery

## 2022-08-21 NOTE — Patient Instructions (Addendum)
Your procedure is scheduled on: Tuesday August 28, 2022. Report to Day Surgery inside [facility] 2nd floor, stop by registration desk before getting on elevator.  To find out your arrival time please call [phone] between 1PM - 3PM on Monday August 27, 2022.  Remember: Instructions that are not followed completely may result in serious medical risk,  up to and including death, or upon the discretion of your surgeon and anesthesiologist your  surgery may need to be rescheduled.     _X__ 1. Do not eat food after midnight the night before your procedure.                 No chewing gum or hard candies. You may drink clear liquids up to 2 hours                 before you are scheduled to arrive for your surgery- DO not drink clear                 liquids within 2 hours of the start of your surgery.                 Clear Liquids include:  water, apple juice without pulp, Black Coffee or Tea (Do not add                 anything to coffee or tea).  __X__2.   Complete the "Ensure Clear Pre-surgery Clear Carbohydrate Drink" provided to you, 2 hours before arrival. **If you       are diabetic you will be provided with an alternative drink, Gatorade Zero or G2.  __X__3.  On the morning of surgery brush your teeth with toothpaste and water, you                may rinse your mouth with mouthwash if you wish.  Do not swallow any toothpaste or mouthwash.     _X__ 4.  No Alcohol for 24 hours before or after surgery.   _X__ 5.  Do Not Smoke or use e-cigarettes For 24 Hours Prior to Your Surgery.                 Do not use any chewable tobacco products for at least 6 hours prior to                 Surgery.  _X__  6.  Do not use any recreational drugs (marijuana, cocaine, heroin, ecstasy, MDMA or other)                For at least one week prior to your surgery.  Combination of these drugs with anesthesia                May have life threatening results.  ____  7.  Bring  all medications with you on the day of surgery if instructed.   __X__  8.  Notify your doctor if there is any change in your medical condition      (cold, fever, infections).     Do not wear jewelry, make-up, hairpins, clips or nail polish. Do not wear lotions, powders, or perfumes. You may wear deodorant. Do not shave 48 hours prior to surgery. Men may shave face and neck. Do not bring valuables to the hospital.    [facility] is not responsible for any belongings or valuables.  Contacts, dentures or bridgework may not be worn into surgery. Leave your suitcase in the car. After surgery  it may be brought to your room. For patients admitted to the hospital, discharge time is determined by your treatment team.   Patients discharged the day of surgery will not be allowed to drive home.   Make arrangements for someone to be with you for the first 24 hours of your Same Day Discharge.   __X__ Take these medicines the morning of surgery with A SIP OF WATER:    1. gabapentin (NEURONTIN) 300 MG   2. metoprolol tartrate (LOPRESSOR) 25 MG   3.   4.  5.  6.  ____ Fleet Enema (as directed)   __X__ Use CHG Soap (or wipes) as directed  ____ Use Benzoyl Peroxide Gel as instructed  ____ Use inhalers on the day of surgery  ____ Stop metformin 2 days prior to surgery    ____ Take 1/2 of usual insulin dose the night before surgery. No insulin the morning          of surgery.   __X__ Stop clopidogrel (PLAVIX) 75 MG  5 days prior to surgery and start back 2 days after surgery as instructed by your doctor.   __X__ One Week prior to surgery- Stop Anti-inflammatories such as Ibuprofen, Aleve, Advil, Motrin, meloxicam (MOBIC), diclofenac, etodolac, ketorolac, Toradol, Daypro, piroxicam, Goody's or BC powders. OK TO USE TYLENOL IF NEEDED   __X__ One week prior to surgery stop ALL supplements until after surgery. BIOTIN and COLLAGEN    ____ Bring C-Pap to the hospital.    If you have any  questions regarding your pre-procedure instructions,  Please call Pre-admit Testing at [phone]

## 2022-08-22 ENCOUNTER — Ambulatory Visit: Payer: No Typology Code available for payment source | Admitting: Dermatology

## 2022-08-22 ENCOUNTER — Encounter: Payer: Self-pay | Admitting: Surgery

## 2022-08-22 ENCOUNTER — Encounter: Payer: Self-pay | Admitting: Orthopedic Surgery

## 2022-08-22 NOTE — Progress Notes (Signed)
Perioperative Services  Pre-Admission/Anesthesia Testing Clinical Review  Date: 08/23/22  Patient Demographics:  Name: Michelle Norris DOB:   [birth year] MRN:   [id number]  Planned Surgical Procedure(s):    Case: [id number] Date/Time: 08/28/22 0715   Procedure: Right shoulder arthroscopic rotator cuff repair, subacromial decompression, distal clavicle excision, and biceps tenodesis (Right)   Anesthesia type: Choice   Pre-op diagnosis: Traumatic complete tear of right rotator cuff, initial encounter  S46.011A   Location: [facility] [place]   Surgeons: Leim Fabry, MD   NOTE: Available PAT nursing documentation and vital signs have been reviewed. Clinical nursing staff has updated patient's PMH/PSHx, current medication list, and drug allergies/intolerances to ensure comprehensive history available to assist in medical decision making as it pertains to the aforementioned surgical procedure and anticipated anesthetic course. Extensive review of available clinical information performed. [place] [facility] and PSHx updated with any diagnoses/procedures that  may have been inadvertently omitted during her intake with the pre-admission testing department's nursing staff.  Clinical Discussion:  Michelle Norris is a 51 y.o. female who is submitted for pre-surgical anesthesia review and clearance prior to her undergoing the above procedure. Patient has never been a smoker. Pertinent PMH includes: CAD, NICM, CHF, atrial fibrillation (s/p LAA closure), cerebellar CVA, NSVT, PSVT, aortic atherosclerosis, HTN, HLD, OSAH (no nocturnal PAP therapy; has inspire device), OA, peripheral edema.  Patient is followed by cardiology Clayborn Bigness, MD). She was last seen in the cardiology clinic on 07/04/2022; notes reviewed.  At the time of her clinic visit, patient reporting episodes of exertional dyspnea and peripheral edema.  She denied any associated chest pain, PND, orthopnea, palpitations,  significant vertiginous symptoms, or presyncope/syncope.  Patient with a past medical history significant for cardiovascular diagnoses.  Diagnostic LEFT heart catheterization was performed on 10/15/2017 revealing a moderately reduced left ventricular systolic function with an EF of 35-45%.  LVEDP was normal.  Study demonstrated normal coronary anatomy with no evidence of obstructive CAD.  MRI of the brain performed on 10/18/2017 revealed a 5 mm acute infarct in the RIGHT superior cerebellum.  Patient with no residual neurological deficits related to neurovascular event.  Patient with a documented history of nonischemic cardiomyopathy.  TTE back in 09/2017 demonstrated a reduced EF of 30%.  Cardiac function has been serially monitored since diagnosis.  Most recent TTE performed on 05/08/2021 revealed a low normal left ventricular systolic function with an EF of 50%.  Coronary CTA performed on 08/10/2021 revealed a normal left ventricular systolic function with a hyperdynamic LVEF of 67%.  Coronary calcium score was 19.5, which was 90th percentile for age and sex matched control.  Holter study performed on 03/26/2022 demonstrated 4 runs of SVT with the longest lasting 19 beats and 5 episodes of NSVT with the longest lasting 18 beats.  Repeat coronary CTA performed on 05/01/2022 revealed a coronary calcium score of 13, which was in the 63 percentile for age and sex matched control.  Patient with an atrial fibrillation diagnosis; CHA2DS2-VASc Score = 6 (age, CHF, HTN, CVA x2, vascular disease history).patient is status post a DCCV procedure on 12/02/2017, at which time she received a single 150 J synchronized cardioversion restoring NSR.  Patient went on to develop refractory atrial dysrhythmia.,  And ultimately had LAA closure (Watchman device) performed on 03/01/2022.  Her rate and rhythm are currently being maintained without the use of pharmacological intervention.  Following the LAA closure, patient  remains on daily antiplatelet therapy using clopidogrel.  Patient reportedly compliant with therapy with no evidence or reports of GI bleeding.  Blood pressure reasonably controlled at 128/76 mmHg on currently prescribed beta-blocker (metoprolol tartrate) monotherapy.  Patient is on atorvastatin for HLD diagnosis and ASCVD prevention.  She is not diabetic.  Patient does have an OSAH diagnosis, however does not require nocturnal PAP therapy following Inspire device placement.  Patient making effort to remain active.  Functional capacity, per the DASI, places patient at the ability to achieve at least 4 METS of physical activity without experiencing any degree of angina/anginal equivalent symptoms.  No changes were made to her medication regimen during her visit with cardiology.  Patient to follow-up with outpatient cardiology in 6 months or sooner if needed.  Michelle Norris is scheduled for an elective RIGHT SHOULDER ARTHROSCOPIC ROTATOR CUFF REPAIR, SUBACROMIAL DECOMPRESSION, DISTAL CLAVICLE EXCISION, AND BICEPS TENODESIS on 08/28/2022 with Dr. Leim Fabry, MD.  Given patient's past medical history significant for cardiovascular diagnoses, presurgical cardiac clearance was sought by the PAT team. Per cardiology, "this patient is optimized for surgery and may proceed with the planned procedural course with a LOW risk of significant perioperative cardiovascular complications".  Again, this patient is on daily antiplatelet therapy. She has been instructed on recommendations for holding her clopidogrel dose for 5 days prior to her procedure with plans to restart as soon as postoperative bleeding risk felt to be minimized by her attending surgeon. The patient has been instructed that her last dose of her clopidogrel will be on 08/22/2022.  Patient denies previous perioperative complications with anesthesia in the past. In review of the available records, it is noted that patient underwent a general anesthetic course  at [facility] ([facility] in 07/2022 without documented complications.      08/21/2022    8:46 AM 07/04/2022    4:00 AM 07/04/2022    3:30 AM  Vitals with BMI  Height '5\' 8"'$     Weight 182 lbs    BMI 65.68    Systolic  127 517  Diastolic  69 80  Pulse  61 57    Providers/Specialists:   NOTE: Primary physician provider listed below. Patient may have been seen by APP or partner within same practice.   PROVIDER ROLE / SPECIALTY LAST Rossie Muskrat, MD Orthopedics (Surgeon) 08/15/2022  Marinda Elk, MD Primary Care Provider 03/26/2022  Katrine Coho, MD Cardiology 07/04/2022  Mee Hives, MD Endocrinology 04/23/2022  Jennings Books, MD Neurology 05/18/2022   Allergies:  Wasp venom, Amoxicillin, Latex, and Tape  Current Home Medications:   No current facility-administered medications for this encounter.    acetaminophen (TYLENOL) 500 MG tablet   atorvastatin (LIPITOR) 20 MG tablet   BIOTIN PO   Calcium Carb-Cholecalciferol (CALCIUM + D3 PO)   clopidogrel (PLAVIX) 75 MG tablet   COLLAGEN PO   diphenhydrAMINE (BENADRYL) 25 mg capsule   estradiol (ESTRACE) 0.1 MG/GM vaginal cream   Fremanezumab-vfrm (AJOVY) 225 MG/1.5ML SOAJ   gabapentin (NEURONTIN) 300 MG capsule   Ivermectin (SOOLANTRA) 1 % CREA   LINZESS 290 MCG CAPS capsule   metoprolol tartrate (LOPRESSOR) 25 MG tablet   metroNIDAZOLE (METROGEL) 0.75 % gel   Multiple Minerals-Vitamins (CAL MAG ZINC +D3 PO)   Multiple Vitamins-Minerals (BARIATRIC MULTIVITAMINS/IRON PO)   mupirocin ointment (BACTROBAN) 2 %   NURTEC 75 MG TBDP   nystatin (MYCOSTATIN/NYSTOP) powder   ondansetron (ZOFRAN-ODT) 4 MG disintegrating tablet   triamcinolone cream (KENALOG) 0.1 %   vitamin B-12 (CYANOCOBALAMIN) 1000 MCG  tablet   ZITHROMAX 500 MG tablet   History:   Past Medical History:  Diagnosis Date   Aortic atherosclerosis ([place])    Atrial fibrillation ([place])    a.) CHA2DS2VASc = 6 (sex, CHF, HTN, CVA  x2, vascular disease history);  b.) s/p DCCV (150 J x1) 12/02/2017; c.) s/p Watchman device placement 03/01/2022; d.) rate/rhythm maintained now without pharmacological intervention; chronically anticoagulated with clopidogrel   Atypical mole 08/03/2021   L sacral, moderate atypia   Bradycardia    CAD (coronary artery disease)    a.) LHC 10/15/2017: EF 35-45%, LVEDP norm, normal coronaries; b.) cCTA 08/10/2021: EF 67%. CAC score = 19.5 (90th percentile for age/sex match); c.) cCTA 05/01/2022: CAC score 13 (87th percentile for age/sex match)   Cerebellar stroke ([place]) 10/18/2017   a.) MRI brain 10/18/2017: 5 mm acute infarct right superior cerebellum   CHF (congestive heart failure) ([place])    History of Roux-en-Y gastric bypass    Hyperlipidemia    Hypertension    Leukocytosis 07/2018   being monitored by pcp   Long term current use of antithrombotics/antiplatelets    a.) clopidogrel   Migraines    NICM (nonischemic cardiomyopathy) ([place])    a.) TTE 09/17/2017: EF 30%; b.) MPI 09/20/2017: EF 45-50%; c.) LHC 10/15/2017: EF 35-45%; d.) TTE 03/23/2020: EF 45%; e.) TTE 05/08/2021: EF 50%; f.) cCTA 08/10/2021: EF 67%   NSVT (nonsustained ventricular tachycardia) ([place]) 03/26/2022   a.) Holter 03/26/2022 - 5 runs of NSVT with the longest lasting 18 beats   Obesity    OSA (obstructive sleep apnea)    a.) no nocturnal PAP therapy; has Inspire implant   Osteoarthritis    Peripheral edema    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   a.) s/p Watchman FLX 27 mm device placement   PSVT (paroxysmal supraventricular tachycardia) 03/26/2022   a.) Holter 03/26/2022 - 4 runs of SVT with the longest lasting 19 beats   [facility] spotted fever 06/2018   bulls-eye rash on back   Vitamin B12 deficiency    Past Surgical History:  Procedure Laterality Date   BICEPT TENODESIS Left 08/25/2018   Procedure: BICEPS TENODESIS;  Surgeon: Leim Fabry, MD;  Location: [facility] ORS;   Service: Orthopedics;  Laterality: Left;   CARDIOVERSION N/A 12/02/2017   Procedure: CARDIOVERSION;  Surgeon: Yolonda Kida, MD;  Location: [facility] ORS;  Service: Cardiovascular;  Laterality: N/A;   LEFT ATRIAL APPENDAGE OCCLUSION N/A 03/01/2022   Procedure: LEFT ATRIAL APPENDAGE OCCLUSION;  Surgeon: Vickie Epley, MD;  Location: [place] [facility];  Service: Cardiovascular;  Laterality: N/A;   LEFT HEART CATH AND CORONARY ANGIOGRAPHY Left 10/15/2017   Procedure: LEFT HEART CATH AND CORONARY ANGIOGRAPHY;  Surgeon: Yolonda Kida, MD;  Location: [place] [facility];  Service: Cardiovascular;  Laterality: Left;   LEFT HEART CATH AND CORONARY ANGIOGRAPHY Left 10/15/2017   Procedure: LEFT HEART CATH AND CORONARY ANGIOGRAPHY; Location: [place]; Surgeon: Katrine Coho, MD   ORIF [place] Right 2004   fall on right ; ANKLE FRACTURE   SHOULDER ARTHROSCOPY WITH OPEN ROTATOR CUFF REPAIR Left 08/25/2018   Procedure: SHOULDER ARTHROSCOPY WITH SUBCROMIAL DECOMPRESSION  DISTAL CLAVICLE EXCISION VS.OPEN ROTATOR CUFF REPAIR;  Surgeon: Leim Fabry, MD;  Location: [facility] ORS;  Service: Orthopedics;  Laterality: Left;   SUPRACERVICAL ABDOMINAL HYSTERECTOMY N/A 2013   TEE WITHOUT CARDIOVERSION N/A 03/01/2022   Procedure: TRANSESOPHAGEAL ECHOCARDIOGRAM (TEE);  Surgeon: Vickie Epley, MD;  Location: [facility] INVASIVE CV  LAB;  Service: Cardiovascular;  Laterality: N/A;   TEMPOROMANDIBULAR JOINT ARTHROSCOPY Bilateral    1994   Family History  Problem Relation Age of Onset   Breast cancer Maternal Grandmother    Diabetes Maternal Grandmother    Dementia Maternal Grandmother    Atrial fibrillation Father    Hypertension Father    Congestive Heart Failure Father    Diabetes Paternal Grandmother    Heart attack Paternal Grandmother    Social History   Tobacco Use   Smoking status: Never   Smokeless tobacco: Never  Vaping Use   Vaping Use: Never used  Substance Use Topics   Alcohol  use: Not Currently    Comment: occasional/ socially   Drug use: No    Pertinent Clinical Results:  LABS: Labs reviewed: Acceptable for surgery.  No visits with results within 3 Day(s) from this visit.  Latest known visit with results is:  Admission on 07/04/2022, Discharged on 07/04/2022  Component Date Value Ref Range Status   Lipase 07/03/2022 31  11 - 51 U/L Final   Performed at [facility], [place], [place] [address]   Sodium 07/03/2022 138  135 - 145 mmol/L Final   Potassium 07/03/2022 4.2  3.5 - 5.1 mmol/L Final   Chloride 07/03/2022 103  98 - 111 mmol/L Final   CO2 07/03/2022 26  22 - 32 mmol/L Final   Glucose, Bld 07/03/2022 96  70 - 99 mg/dL Final   Glucose reference range applies only to samples taken after fasting for at least 8 hours.   BUN 07/03/2022 14  6 - 20 mg/dL Final   Creatinine, Ser 07/03/2022 0.57  0.44 - 1.00 mg/dL Final   Calcium 07/03/2022 9.7  8.9 - 10.3 mg/dL Final   Total Protein 07/03/2022 7.5  6.5 - 8.1 g/dL Final   Albumin 07/03/2022 4.4  3.5 - 5.0 g/dL Final   AST 07/03/2022 33  15 - 41 U/L Final   ALT 07/03/2022 57 (H)  0 - 44 U/L Final   Alkaline Phosphatase 07/03/2022 90  38 - 126 U/L Final   Total Bilirubin 07/03/2022 0.9  0.3 - 1.2 mg/dL Final   GFR, Estimated 07/03/2022 >60  >60 mL/min Final   Comment: (NOTE) Calculated using the CKD-EPI Creatinine Equation (2021)    Anion gap 07/03/2022 9  5 - 15 Final   Performed at [facility], [place], [place] [address]   WBC 07/03/2022 6.5  4.0 - 10.5 K/uL Final   RBC 07/03/2022 4.54  3.87 - 5.11 MIL/uL Final   Hemoglobin 07/03/2022 13.1  12.0 - 15.0 g/dL Final   HCT 07/03/2022 40.4  36.0 - 46.0 % Final   MCV 07/03/2022 89.0  80.0 - 100.0 fL Final   MCH 07/03/2022 28.9  26.0 - 34.0 pg Final   MCHC 07/03/2022 32.4  30.0 - 36.0 g/dL Final   RDW 07/03/2022 13.7  11.5 - 15.5 % Final   Platelets 07/03/2022 264  150 - 400 K/uL Final   nRBC 07/03/2022  0.0  0.0 - 0.2 % Final   Performed at [facility], [place], [place] [address]   Color, Urine 07/03/2022 YELLOW (A)  YELLOW Final   APPearance 07/03/2022 HAZY (A)  CLEAR Final   Specific Gravity, Urine 07/03/2022 1.015  1.005 - 1.030 Final   pH 07/03/2022 5.0  5.0 - 8.0 Final   Glucose, UA 07/03/2022 NEGATIVE  NEGATIVE mg/dL Final   Hgb urine  dipstick 07/03/2022 NEGATIVE  NEGATIVE Final   Bilirubin Urine 07/03/2022 NEGATIVE  NEGATIVE Final   Ketones, ur 07/03/2022 NEGATIVE  NEGATIVE mg/dL Final   Protein, ur 07/03/2022 NEGATIVE  NEGATIVE mg/dL Final   Nitrite 07/03/2022 NEGATIVE  NEGATIVE Final   Leukocytes,Ua 07/03/2022 NEGATIVE  NEGATIVE Final   Performed at [facility], [address] [place], [place] [address]    ECG: Date: 07/03/2022 Time ECG obtained: 2104 PM Rate: 64 bpm Rhythm: normal sinus Axis (leads I and aVF): Normal Intervals: PR 140 ms. QRS 72 ms. QTc 447 ms. ST segment and T wave changes: No evidence of acute ST segment elevation or depression Comparison: Similar to previous tracing obtained on 03/02/2022   IMAGING / PROCEDURES: CT CHEST, ABDOMEN, PELVIS performed on 07/04/2022 Negative for acute pulmonary embolism. Mild cardiomegaly Skin thickening and subareolar mass in the left breast. Recommend further evaluation with mammography. Mild wall thickening in the jejunum may be due to decompression or mild enteritis. Otherwise no acute abnormality in the abdomen or pelvis. Aortic atherosclerosis  CT CARDIAC MORPH/PULM VEIN W/CM&W/O CA SCORE performed on 05/01/2022 There is normal pulmonary vein drainage into the left atrium. The left atrial appendage is successfully occluded with appropriate compression. Coronary calcium score of 13, which is 87th percentile for age, sex, and race matched control. 2 mm right solid pulmonary nodule within the upper lobe.   LONG TERM CARDIAC EVENT MONITOR STUDY performed on 03/26/2022 HR 43  - 169bpm, average 70 bpm. 5 nonsustained VT, longest 18 beats 4 nonsustained SVT, longest 19 beats Rare supraventricular ectopy. Occasional ventricular ectopy, 2.1%. No sustained arrhythmias. Symptom triggered episodes correspond to sinus rhythm +/- PVC's.  TRANSESOPHAGEAL ECHOCARDIOGRAM performed on 03/01/2022 Interventional TEE for LAA-O.  Prior to procedure, patent left atrial appendage. mean diameter 1.7 cm with depth 1.9 cm. Small PFO noted.  Mid-mid transeptal puncture without incident.  Attempt of 24 mm Watchman FLX with compression < 10%. Device was recaptured.  Final deployment 27 mm Watchman FLX. Average diameter 22.1- 18% compressed. No peri-device leak.  No significant pericardial effusion. Left to right shunt post transeptal puncture.  Left ventricular ejection fraction, by estimation, is 35 to 40%. The left ventricle has normal function.  Right ventricular systolic function is normal. The right ventricular size is normal.  Left atrial size was mildly dilated. No left atrial/left atrial appendage thrombus was detected.  Right atrial size was mildly dilated.  The mitral valve is normal in structure. Trivial mitral valve regurgitation. No evidence of mitral stenosis.  The aortic valve is tricuspid. Aortic valve regurgitation is not visualized. No aortic stenosis is present.   TRANSTHORACIC ECHOCARDIOGRAM performed on 05/08/2021 Low normal left ventricular systolic function with an EF of 50% Trivial mitral and tricuspid valve regurgitation Normal gradients; no valvular stenosis No pericardial effusion  LEFT HEART CATHETERIZATION AND CORONARY ANGIOGRAPHY performed on 10/15/2017 Mild to moderate left ventricular systolic dysfunction with an EF of 35-45% Normal LVEDP  Normal coronaries with no evidence of significant epicardial coronary artery disease  Impression and Plan:  Michelle Norris has been referred for pre-anesthesia review and clearance prior to her undergoing the  planned anesthetic and procedural courses. Available labs, pertinent testing, and imaging results were personally reviewed by me. This patient has been appropriately cleared by cardiology with an overall LOW risk of significant perioperative cardiovascular complications.  Based on clinical review performed today (08/23/22), barring any significant acute changes in the patient's overall condition, it is anticipated that she will be able to  proceed with the planned surgical intervention. Any acute changes in clinical condition may necessitate her procedure being postponed and/or cancelled. Patient will meet with anesthesia team (MD and/or CRNA) on the day of her procedure for preoperative evaluation/assessment. Questions regarding anesthetic course will be fielded at that time.   Pre-surgical instructions were reviewed with the patient during her PAT appointment and questions were fielded by PAT clinical staff. Patient was advised that if any questions or concerns arise prior to her procedure then she should return a call to PAT and/or her surgeon's office to discuss.  Honor Loh, MSN, APRN, FNP-C, CEN [facility]  Peri-operative Services Nurse Practitioner Phone: [phone] Fax: [phone] 08/23/22 12:18 PM  NOTE: This note has been prepared using Dragon dictation software. Despite my best ability to proofread, there is always the potential that unintentional transcriptional errors may still occur from this process.

## 2022-08-23 ENCOUNTER — Ambulatory Visit (INDEPENDENT_AMBULATORY_CARE_PROVIDER_SITE_OTHER): Payer: No Typology Code available for payment source | Admitting: Dermatology

## 2022-08-23 DIAGNOSIS — L905 Scar conditions and fibrosis of skin: Secondary | ICD-10-CM | POA: Diagnosis not present

## 2022-08-23 DIAGNOSIS — L03113 Cellulitis of right upper limb: Secondary | ICD-10-CM | POA: Diagnosis not present

## 2022-08-23 MED ORDER — TRIAMCINOLONE ACETONIDE 0.1 % EX CREA: 1.0000 | TOPICAL_CREAM | CUTANEOUS | 0 refills | Status: DC

## 2022-08-23 NOTE — Patient Instructions (Signed)
Due to recent changes in healthcare laws, you may see results of your pathology and/or laboratory studies on MyChart before the doctors have had a chance to review them. We understand that in some cases there may be results that are confusing or concerning to you. Please understand that not all results are received at the same time and often the doctors may need to interpret multiple results in order to provide you with the best plan of care or course of treatment. Therefore, we ask that you please give us 2 business days to thoroughly review all your results before contacting the office for clarification. Should we see a critical lab result, you will be contacted sooner.   If You Need Anything After Your Visit  If you have any questions or concerns for your doctor, please call our main line at [phone] and press option 4 to reach your doctor's medical assistant. If no one answers, please leave a voicemail as directed and we will return your call as soon as possible. Messages left after 4 pm will be answered the following business day.   You may also send us a message via MyChart. We typically respond to MyChart messages within 1-2 business days.  For prescription refills, please ask your pharmacy to contact our office. Our fax number is [phone].  If you have an urgent issue when the clinic is closed that cannot wait until the next business day, you can page your doctor at the number below.    Please note that while we do our best to be available for urgent issues outside of office hours, we are not available 24/7.   If you have an urgent issue and are unable to reach us, you may choose to seek medical care at your doctor's office, retail clinic, urgent care center, or emergency room.  If you have a medical emergency, please immediately call 911 or go to the emergency department.  Pager Numbers  - Dr. Kowalski: [phone]  - Dr. Moye: [phone]  - Dr. Stewart:  [phone]  In the event of inclement weather, please call our main line at [phone] for an update on the status of any delays or closures.  Dermatology Medication Tips: Please keep the boxes that topical medications come in in order to help keep track of the instructions about where and how to use these. Pharmacies typically print the medication instructions only on the boxes and not directly on the medication tubes.   If your medication is too expensive, please contact our office at [phone] option 4 or send us a message through MyChart.   We are unable to tell what your co-pay for medications will be in advance as this is different depending on your insurance coverage. However, we may be able to find a substitute medication at lower cost or fill out paperwork to get insurance to cover a needed medication.   If a prior authorization is required to get your medication covered by your insurance company, please allow us 1-2 business days to complete this process.  Drug prices often vary depending on where the prescription is filled and some pharmacies may offer cheaper prices.  The website www.goodrx.com contains coupons for medications through different pharmacies. The prices here do not account for what the cost may be with help from insurance (it may be cheaper with your insurance), but the website can give you the price if you did not use any insurance.  - You can print the associated coupon and take it with   your prescription to the pharmacy.  - You may also stop by our office during regular business hours and pick up a GoodRx coupon card.  - If you need your prescription sent electronically to a different pharmacy, notify our office through [place] MyChart or by phone at [phone] option 4.     Si Usted Necesita Algo Despus de Su Visita  Tambin puede enviarnos un mensaje a travs de MyChart. Por lo general respondemos a los mensajes de MyChart en el transcurso de 1 a 2  das hbiles.  Para renovar recetas, por favor pida a su farmacia que se ponga en contacto con nuestra oficina. Nuestro nmero de fax es el [phone].  Si tiene un asunto urgente cuando la clnica est cerrada y que no puede esperar hasta el siguiente da hbil, puede llamar/localizar a su doctor(a) al nmero que aparece a continuacin.   Por favor, tenga en cuenta que aunque hacemos todo lo posible para estar disponibles para asuntos urgentes fuera del horario de oficina, no estamos disponibles las 24 horas del da, los 7 das de la semana.   Si tiene un problema urgente y no puede comunicarse con nosotros, puede optar por buscar atencin mdica  en el consultorio de su doctor(a), en una clnica privada, en un centro de atencin urgente o en una sala de emergencias.  Si tiene una emergencia mdica, por favor llame inmediatamente al 911 o vaya a la sala de emergencias.  Nmeros de bper  - Dr. Kowalski: [phone]  - Dra. Moye: [phone]  - Dra. Stewart: [phone]  En caso de inclemencias del tiempo, por favor llame a nuestra lnea principal al [phone] para una actualizacin sobre el estado de cualquier retraso o cierre.  Consejos para la medicacin en dermatologa: Por favor, guarde las cajas en las que vienen los medicamentos de uso tpico para ayudarle a seguir las instrucciones sobre dnde y cmo usarlos. Las farmacias generalmente imprimen las instrucciones del medicamento slo en las cajas y no directamente en los tubos del medicamento.   Si su medicamento es muy caro, por favor, pngase en contacto con nuestra oficina llamando al [phone] y presione la opcin 4 o envenos un mensaje a travs de MyChart.   No podemos decirle cul ser su copago por los medicamentos por adelantado ya que esto es diferente dependiendo de la cobertura de su seguro. Sin embargo, es posible que podamos encontrar un medicamento sustituto a menor costo o llenar un formulario para que el  seguro cubra el medicamento que se considera necesario.   Si se requiere una autorizacin previa para que su compaa de seguros cubra su medicamento, por favor permtanos de 1 a 2 das hbiles para completar este proceso.  Los precios de los medicamentos varan con frecuencia dependiendo del lugar de dnde se surte la receta y alguna farmacias pueden ofrecer precios ms baratos.  El sitio web www.goodrx.com tiene cupones para medicamentos de diferentes farmacias. Los precios aqu no tienen en cuenta lo que podra costar con la ayuda del seguro (puede ser ms barato con su seguro), pero el sitio web puede darle el precio si no utiliz ningn seguro.  - Puede imprimir el cupn correspondiente y llevarlo con su receta a la farmacia.  - Tambin puede pasar por nuestra oficina durante el horario de atencin regular y recoger una tarjeta de cupones de GoodRx.  - Si necesita que su receta se enve electrnicamente a una farmacia diferente, informe a nuestra oficina a travs de MyChart de [place]   o por telfono llamando al [phone] y presione la opcin 4.  

## 2022-08-23 NOTE — Progress Notes (Signed)
   Follow-Up Visit   Subjective  Michelle Norris is a 51 y.o. female who presents for the following: Check scar at recent cyst excision  (R post shoulder, excised 06/05/22, had wound dehiscence and repaired 06/19/22, spitting suture 07/17/22, tender and pt feels like something drains out when pressed).  The following portions of the chart were reviewed this encounter and updated as appropriate:   Tobacco  Allergies  Meds  Problems  Med Hx  Surg Hx  Fam Hx     Review of Systems:  No other skin or systemic complaints except as noted in HPI or Assessment and Plan.  Objective  Well appearing patient in no apparent distress; mood and affect are within normal limits.  A focused examination was performed including R post shoulder. Relevant physical exam findings are noted in the Assessment and Plan.  R post shoulder Scar R post shoulder with no drainage today   Assessment & Plan  Scar with deep suture unusual reaction with inflammation R post shoulder  Irritated Likely irritation and inflamed from unusual reaction to deep sutures  Pt had recent implant on right chest with similar reaction to sutures.  Discussed treatment options : 1- doing nothing vs  2- topical steroid vs  3- IL Kenalog injections  Start TMC 0.1% cr qd x 2 wks, then qd up to 5d/wk until resolved, avoid f/g/a  Topical steroids (such as triamcinolone, fluocinolone, fluocinonide, mometasone, clobetasol, halobetasol, betamethasone, hydrocortisone) can cause thinning and lightening of the skin if they are used for too long in the same area. Your physician has selected the right strength medicine for your problem and area affected on the body. Please use your medication only as directed by your physician to prevent side effects.    triamcinolone cream (KENALOG) 0.1 % - R post shoulder Apply 1 Application topically as directed. Qd for 2 weeks then decrease to Qd up to 5 days per week until clear, avoid face, groin,  axilla  Return for as scheduled for TBSE, Hx of Dysplastic nevi.  I, Othelia Pulling, RMA, am acting as scribe for Sarina Ser, MD . Documentation: I have reviewed the above documentation for accuracy and completeness, and I agree with the above.  Sarina Ser, MD

## 2022-08-24 NOTE — Progress Notes (Unsigned)
[facility]                                     Cardiology Office Note:    Date:  08/27/2022   ID:  Michelle Norris, DOB 08/27/1971, MRN [id number]  PCP:  Michelle Elk, MD  [facility] Cardiologist:  None  [facility] Electrophysiologist:  Michelle Epley, MD   Referring MD: Michelle Elk, MD   Chief Complaint  Patient presents with   Follow-up    6 month s/p LAAO   History of Present Illness:    Michelle Norris is a 52 y.o. female with a hx of HTN, HLD, obesity s/p gastric bypass, hx of CVA, OSA, NICM, HFrEF, and PAF s/p LAAO closure not longer requiring anticoagulation.    [name] was previously on NOAC prior to her gastric bypass surgery however because of absorption concerns, this was transitioned to Coumadin. While on Coumadin she is had very difficult time controlling INRs despite compliance. She was also experiencing bleeding associate with her Coumadin and desired a strategy which avoids long-term anticoagulation. She was seen by Dr. Quentin Norris in consultation for Watchman. Cardiac CT showed anatomy amendable for LAAO. In follow up 10/11/21 she was scheduled for procedure 10/12/21. Unfortunately, there were issues with pre-authorization with her insurance and the procedure was ultimately cancelled. An appeal was placed however this was also denied.    Our team was then re-contacted with plans to re-submit the request for Watchman implant which was approved. She was then admitted and underwent left atrial appendage occlusive device placement with Watchman FLX 63m on 03/01/22. She was monitored on telemetry which showed intermittent PVCs and SVT. She was placed with a ZIO monitor at d/c that showed PVCs, and intermittent episodes of SVT.    After 45 days of Coumadin post implant, she was transitioned to Plavix. Follow up CT showed stable implant with no leak or thrombus.   Today she presents alone and reports that she will have shoulder surgery  tomorrow and has therefore stopped her Plavix for this. Her 6 month post LAAO closure would have been 09/01/22. Otherwise she has been well with a myriad of other health concerns happening but has been stable from a CV standpoint. She denies chest pain, palpitations, orthopnea, LE edema, bleeding, dizziness, or syncope.   Past Medical History:  Diagnosis Date   Aortic atherosclerosis ([place]    Atrial fibrillation ([place]    a.) CHA2DS2VASc = 6 (sex, CHF, HTN, CVA x2, vascular disease history);  b.) s/p DCCV (150 J x1) 12/02/2017; c.) s/p Watchman device placement 03/01/2022; d.) rate/rhythm maintained now without pharmacological intervention; chronically anticoagulated with clopidogrel   Atypical mole 08/03/2021   L sacral, moderate atypia   Bradycardia    CAD (coronary artery disease)    a.) LHC 10/15/2017: EF 35-45%, LVEDP norm, normal coronaries; b.) cCTA 08/10/2021: EF 67%. CAC score = 19.5 (90th percentile for age/sex match); c.) cCTA 05/01/2022: CAC score 13 (87th percentile for age/sex match)   Cerebellar stroke ([place] 10/18/2017   a.) MRI brain 10/18/2017: 5 mm acute infarct right superior cerebellum   CHF (congestive heart failure) ([facility]    History of Roux-en-Y gastric bypass    Hyperlipidemia    Hypertension    Leukocytosis 07/2018   being monitored by pcp   Long term current use of antithrombotics/antiplatelets    a.) clopidogrel  Migraines    NICM (nonischemic cardiomyopathy) ([place])    a.) TTE 09/17/2017: EF 30%; b.) MPI 09/20/2017: EF 45-50%; c.) LHC 10/15/2017: EF 35-45%; d.) TTE 03/23/2020: EF 45%; e.) TTE 05/08/2021: EF 50%; f.) cCTA 08/10/2021: EF 67%   NSVT (nonsustained ventricular tachycardia) ([place]) 03/26/2022   a.) Holter 03/26/2022 - 5 runs of NSVT with the longest lasting 18 beats   Obesity    OSA (obstructive sleep apnea)    a.) no nocturnal PAP therapy; has Inspire implant   Osteoarthritis    Peripheral edema    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   a.) s/p Watchman FLX 27 mm device placement   PSVT (paroxysmal supraventricular tachycardia) 03/26/2022   a.) Holter 03/26/2022 - 4 runs of SVT with the longest lasting 19 beats   [facility] spotted fever 06/2018   bulls-eye rash on back   Vitamin B12 deficiency     Past Surgical History:  Procedure Laterality Date   BICEPT TENODESIS Left 08/25/2018   Procedure: BICEPS TENODESIS;  Surgeon: Michelle Fabry, MD;  Location: [facility] ORS;  Service: Orthopedics;  Laterality: Left;   CARDIOVERSION N/A 12/02/2017   Procedure: CARDIOVERSION;  Surgeon: Michelle Kida, MD;  Location: [facility] ORS;  Service: Cardiovascular;  Laterality: N/A;   LEFT ATRIAL APPENDAGE OCCLUSION N/A 03/01/2022   Procedure: LEFT ATRIAL APPENDAGE OCCLUSION;  Surgeon: Michelle Epley, MD;  Location: [place] CV LAB;  Service: Cardiovascular;  Laterality: N/A;   LEFT HEART CATH AND CORONARY ANGIOGRAPHY Left 10/15/2017   Procedure: LEFT HEART CATH AND CORONARY ANGIOGRAPHY;  Surgeon: Michelle Kida, MD;  Location: [place] CV LAB;  Service: Cardiovascular;  Laterality: Left;   LEFT HEART CATH AND CORONARY ANGIOGRAPHY Left 10/15/2017   Procedure: LEFT HEART CATH AND CORONARY ANGIOGRAPHY; Location: [place]; Surgeon: Michelle Coho, MD   ORIF [place] Right 2004   fall on right ; ANKLE FRACTURE   SHOULDER ARTHROSCOPY WITH OPEN ROTATOR CUFF REPAIR Left 08/25/2018   Procedure: SHOULDER ARTHROSCOPY WITH SUBCROMIAL DECOMPRESSION  DISTAL CLAVICLE EXCISION VS.OPEN ROTATOR CUFF REPAIR;  Surgeon: Michelle Fabry, MD;  Location: [facility] ORS;  Service: Orthopedics;  Laterality: Left;   SUPRACERVICAL ABDOMINAL HYSTERECTOMY N/A 2013   TEE WITHOUT CARDIOVERSION N/A 03/01/2022   Procedure: TRANSESOPHAGEAL ECHOCARDIOGRAM (TEE);  Surgeon: Michelle Epley, MD;  Location: [place] CV LAB;  Service: Cardiovascular;  Laterality: N/A;   TEMPOROMANDIBULAR JOINT ARTHROSCOPY Bilateral    1994    Current  Medications: Current Meds  Medication Sig   acetaminophen (TYLENOL) 500 MG tablet Take 500-1,000 mg by mouth every 6 (six) hours as needed (pain.).   Acidophilus Lactobacillus CAPS Take 2 capsules by mouth in the morning and at bedtime.   atorvastatin (LIPITOR) 20 MG tablet Take 20 mg by mouth every evening.   Fremanezumab-vfrm (AJOVY) 225 MG/1.5ML SOAJ Inject 225 mg into the skin every 30 (thirty) days.   gabapentin (NEURONTIN) 300 MG capsule Take 300-600 mg by mouth 3 (three) times daily. Take 1 capsules (300 mg) by Katherina Mires in themorning & take 2 capsules (600 mg) by mouth at night.   Ivermectin (SOOLANTRA) 1 % CREA Apply 1 Application topically at bedtime.   LINZESS 290 MCG CAPS capsule Take 290 mcg by mouth daily as needed (mild constipation.).   metoprolol tartrate (LOPRESSOR) 25 MG tablet Take 1 tablet (25 mg total) by mouth 2 (two) times daily. You may take an extra tablet (25 mg) as needed for heart rate greater than 130.  mupirocin ointment (BACTROBAN) 2 % Apply 1 Application topically daily. With dressing changes   NURTEC 75 MG TBDP Take 75 mg by mouth daily as needed (migraine/headaches.).   nystatin (MYCOSTATIN/NYSTOP) powder Apply 1 application. topically daily. Applied to skin folds   ondansetron (ZOFRAN-ODT) 4 MG disintegrating tablet Allow 1-2 tablets to dissolve in your mouth every 8 hours as needed for nausea/vomiting   triamcinolone cream (KENALOG) 0.1 % Apply 1 Application topically as directed. Qd for 2 weeks then decrease to Qd up to 5 days per week until clear, avoid face, groin, axilla     Allergies:   Wasp venom, Amoxicillin, Latex, and Tape   Social History   Socioeconomic History   Marital status: Married    Spouse name: Gaspar Bidding   Number of children: Not on file   Years of education: Not on file   Highest education level: Not on file  Occupational History   Occupation: unemployed  Tobacco Use   Smoking status: Never   Smokeless tobacco: Never  Vaping Use    Vaping Use: Never used  Substance and Sexual Activity   Alcohol use: Not Currently    Comment: occasional/ socially   Drug use: No   Sexual activity: Yes    Comment: PARTIAL HYSYERECTOMY  Other Topics Concern   Not on file  Social History Narrative   Not on file   Social Determinants of Health   Financial Resource Strain: Not on file  Food Insecurity: Not on file  Transportation Needs: Not on file  Physical Activity: Not on file  Stress: Not on file  Social Connections: Not on file     Family History: The patient's family history includes Atrial fibrillation in her father; Breast cancer in her maternal grandmother; Congestive Heart Failure in her father; Dementia in her maternal grandmother; Diabetes in her maternal grandmother and paternal grandmother; Heart attack in her paternal grandmother; Hypertension in her father.  ROS:   Please see the history of present illness.    All other systems reviewed and are negative.  EKGs/Labs/Other Studies Reviewed:    The following studies were reviewed today:  LAAO closure:    CONCLUSIONS:  1.Successful implantation of a WATCHMAN left atrial appendage occlusive device    2. TEE demonstrating no LAA thrombus 3. No early apparent complications.    Post Implant Anticoagulation Strategy: Continue coumadin for 45 days post implant. At the 45-day mark post op, stop coumadin and transition to plavix monotherapy. CT scan is planned 60 days after implant to reassess complete closure of the left atrial appendage.   CT 05/01/22:  MPRESSION: 1. There is normal pulmonary vein drainage into the left atrium.   2. The left atrial appendage is successfully occluded with appropriate compression.   3. Coronary calcium score of 13, which is 87th percentile for age, sex, and race matched control.  EKG:  EKG is not ordered today.    Recent Labs: 07/03/2022: ALT 57; BUN 14; Creatinine, Ser 0.57; Hemoglobin 13.1; Platelets 264; Potassium 4.2;  Sodium 138  Recent Lipid Panel    Component Value Date/Time   CHOL 194 10/18/2017 1913   TRIG 175 (H) 10/18/2017 1913   HDL 37 (L) 10/18/2017 1913   CHOLHDL 5.2 10/18/2017 1913   VLDL 35 10/18/2017 1913   LDLCALC 122 (H) 10/18/2017 1913   Physical Exam:    VS:  BP 92/68   Pulse 66   Ht _0  (1.727 m)   Wt 187 lb 3.2 oz (84.9 kg)  SpO2 98%   BMI 28.46 kg/m     Wt Readings from Last 3 Encounters:  08/27/22 187 lb 3.2 oz (84.9 kg)  08/21/22 182 lb (82.6 kg)  07/03/22 180 lb (81.6 kg)    General: Well developed, well nourished, NAD Lungs:Clear to ausculation bilaterally. No wheezes, rales, or rhonchi. Breathing is unlabored. Cardiovascular: RRR with S1 S2. No murmurs Extremities: No edema.  Neuro: Alert and oriented. No focal deficits. No facial asymmetry. MAE spontaneously. Psych: Responds to questions appropriately with normal affect.    ASSESSMENT/PLAN:    Atrial fibrillation: Underwent LAAO closure with Watchman FLX 84m on 03/01/22. Continued on coumadin for 45 days post implant and was transitioned to Plavix. Post implant CT with no device leak or thrombus. She stopped her Plavix last week in anticipation of shoulder surgery tomorrow. She was to continue this until 09/01/22. Plan one year follow up in person or telehealth. She no longer requires dental SBE.     Palpitations: Post Watchman telemetry showed intermittent PVCs and SVT. She was placed with a ZIO monitor at d/c that showed PVCs, and intermittent episodes of SVT. Continue Metoprolol 265mBID and may take an additional 12.5-25mg if HR sustained >130bpm and SBP >100.     Sleep apnea: [facility]: No residual deficits.   Medication Adjustments/Labs and Tests Ordered: Current medicines are reviewed at length with the patient today.  Concerns regarding medicines are outlined above.  No orders of the defined types were placed in this encounter.  No orders of the defined types were placed in this  encounter.   Patient Instructions  Medication Instructions:  Your physician has recommended you make the following change in your medication:  Stop Clopidogrel. Stop Zithromax  *If you need a refill on your cardiac medications before your next appointment, please call your pharmacy*   Lab Work: none If you have labs (blood work) drawn today and your tests are completely normal, you will receive your results only by: MyTimberonif you have MyChart) OR A paper copy in the mail If you have any lab test that is abnormal or we need to change your treatment, we will call you to review the results.   Testing/Procedures: none   Follow-Up: At [facility] [place] and your health needs are our priority.  As part of our continuing mission to provide you with exceptional heart care, we have created designated Provider Care Teams.  These Care Teams include your primary Cardiologist (physician) and Advanced Practice Providers (APPs -  Physician Assistants and Nurse Practitioners) who all work together to provide you with the care you need, when you need it.  We recommend signing up for the patient portal called "MyChart".  Sign up information is provided on this After Visit Summary.  MyChart is used to connect with patients for Virtual Visits (Telemedicine).  Patients are able to view lab/test results, encounter notes, upcoming appointments, etc.  Non-urgent messages can be sent to your provider as well.   To learn more about what you can do with MyChart, go to [email or web address]   Your next appointment:   [facility]  We will contact you with follow up in our office.   The format for your next appointment:   In Person   Other Instructions    Important Information About Sugar         Signed, JiKathyrn DrownNP  08/27/2022 5:40 PM    [facility]

## 2022-08-27 ENCOUNTER — Ambulatory Visit: Payer: No Typology Code available for payment source | Attending: Cardiology | Admitting: Cardiology

## 2022-08-27 VITALS — BP 92/68 | HR 66 | Ht 68.0 in | Wt 187.2 lb

## 2022-08-27 DIAGNOSIS — I10 Essential (primary) hypertension: Secondary | ICD-10-CM

## 2022-08-27 DIAGNOSIS — I493 Ventricular premature depolarization: Secondary | ICD-10-CM

## 2022-08-27 DIAGNOSIS — I639 Cerebral infarction, unspecified: Secondary | ICD-10-CM | POA: Diagnosis not present

## 2022-08-27 DIAGNOSIS — I4891 Unspecified atrial fibrillation: Secondary | ICD-10-CM

## 2022-08-27 DIAGNOSIS — Z95818 Presence of other cardiac implants and grafts: Secondary | ICD-10-CM

## 2022-08-27 MED ORDER — CEFAZOLIN SODIUM-DEXTROSE 2-4 GM/100ML-% IV SOLN
2.0000 g | INTRAVENOUS | Status: AC
  Administered 2022-08-28: 2 g via INTRAVENOUS

## 2022-08-27 MED ORDER — CHLORHEXIDINE GLUCONATE CLOTH 2 % EX PADS: 6.0000 | MEDICATED_PAD | Freq: Once | CUTANEOUS | Status: DC

## 2022-08-27 MED ORDER — CEFAZOLIN SODIUM-DEXTROSE 2-4 GM/100ML-% IV SOLN: 2.0000 g | INTRAVENOUS | Status: DC

## 2022-08-27 MED ORDER — ORAL CARE MOUTH RINSE: 15.0000 mL | Freq: Once | OROMUCOSAL | Status: AC

## 2022-08-27 MED ORDER — CHLORHEXIDINE GLUCONATE 0.12 % MT SOLN: 15.0000 mL | Freq: Once | OROMUCOSAL | Status: AC

## 2022-08-27 MED ORDER — FAMOTIDINE 20 MG PO TABS: 20.0000 mg | ORAL_TABLET | Freq: Once | ORAL | Status: AC

## 2022-08-27 MED ORDER — LACTATED RINGERS IV SOLN: INTRAVENOUS | Status: DC

## 2022-08-27 NOTE — Patient Instructions (Addendum)
Medication Instructions:  Your physician has recommended you make the following change in your medication:  Stop Clopidogrel. Stop Zithromax  *If you need a refill on your cardiac medications before your next appointment, please call your pharmacy*   Lab Work: none If you have labs (blood work) drawn today and your tests are completely normal, you will receive your results only by: Michelle Norris (if you have MyChart) OR A paper copy in the mail If you have any lab test that is abnormal or we need to change your treatment, we will call you to review the results.   Testing/Procedures: none   Follow-Up: At [facility], you and your health needs are our priority.  As part of our continuing mission to provide you with exceptional heart care, we have created designated Provider Care Teams.  These Care Teams include your primary Cardiologist (physician) and Advanced Practice Providers (APPs -  Physician Assistants and Nurse Practitioners) who all work together to provide you with the care you need, when you need it.  We recommend signing up for the patient portal called "MyChart".  Sign up information is provided on this After Visit Summary.  MyChart is used to connect with patients for Virtual Visits (Telemedicine).  Patients are able to view lab/test results, encounter notes, upcoming appointments, etc.  Non-urgent messages can be sent to your provider as well.   To learn more about what you can do with MyChart, go to [email or web address].    Your next appointment:   Follow up with [facility] clinic as scheduled.  We will contact you with follow up in our office.   The format for your next appointment:   In Person   Other Instructions    Important Information About Sugar

## 2022-08-28 ENCOUNTER — Encounter: Payer: Self-pay | Admitting: Orthopedic Surgery

## 2022-08-28 ENCOUNTER — Ambulatory Visit: Payer: No Typology Code available for payment source | Admitting: Urgent Care

## 2022-08-28 ENCOUNTER — Ambulatory Visit: Payer: No Typology Code available for payment source

## 2022-08-28 ENCOUNTER — Ambulatory Visit
Admission: RE | Admit: 2022-08-28 | Discharge: 2022-08-28 | Disposition: A | Discharge status: Home / Self Care | Payer: No Typology Code available for payment source | Source: Ambulatory Visit | Attending: Orthopedic Surgery | Admitting: Orthopedic Surgery

## 2022-08-28 ENCOUNTER — Other Ambulatory Visit: Payer: Self-pay

## 2022-08-28 ENCOUNTER — Encounter
Admission: RE | Disposition: A | Discharge status: Home / Self Care | Payer: Self-pay | Source: Ambulatory Visit | Attending: Orthopedic Surgery

## 2022-08-28 DIAGNOSIS — Z8673 Personal history of transient ischemic attack (TIA), and cerebral infarction without residual deficits: Secondary | ICD-10-CM | POA: Insufficient documentation

## 2022-08-28 DIAGNOSIS — M19011 Primary osteoarthritis, right shoulder: Secondary | ICD-10-CM | POA: Insufficient documentation

## 2022-08-28 DIAGNOSIS — M25811 Other specified joint disorders, right shoulder: Secondary | ICD-10-CM | POA: Insufficient documentation

## 2022-08-28 DIAGNOSIS — S46011A Strain of muscle(s) and tendon(s) of the rotator cuff of right shoulder, initial encounter: Secondary | ICD-10-CM | POA: Diagnosis not present

## 2022-08-28 DIAGNOSIS — I110 Hypertensive heart disease with heart failure: Secondary | ICD-10-CM | POA: Insufficient documentation

## 2022-08-28 DIAGNOSIS — I5020 Unspecified systolic (congestive) heart failure: Secondary | ICD-10-CM | POA: Insufficient documentation

## 2022-08-28 DIAGNOSIS — X58XXXA Exposure to other specified factors, initial encounter: Secondary | ICD-10-CM | POA: Diagnosis not present

## 2022-08-28 HISTORY — PX: SHOULDER ARTHROSCOPY WITH SUBACROMIAL DECOMPRESSION AND OPEN ROTATOR C: SHX5688

## 2022-08-28 SURGERY — ARTHROSCOPY, SHOULDER WITH REPAIR, ROTATOR CUFF, OPEN
Anesthesia: Choice | Laterality: Right

## 2022-08-28 SURGERY — SHOULDER ARTHROSCOPY WITH SUBACROMIAL DECOMPRESSION AND OPEN ROTATOR CUFF REPAIR, OPEN BICEPS TENDON REPAIR
Anesthesia: Regional | Laterality: Right

## 2022-08-28 MED ORDER — OXYCODONE HCL 5 MG PO TABS: 5.0000 mg | ORAL_TABLET | ORAL | 0 refills | Status: DC | PRN

## 2022-08-28 MED ORDER — GLYCOPYRROLATE 0.2 MG/ML IJ SOLN
INTRAMUSCULAR | Status: DC | PRN
  Administered 2022-08-28: .2 mg via INTRAVENOUS

## 2022-08-28 MED ORDER — LIDOCAINE HCL (CARDIAC) PF 100 MG/5ML IV SOSY
PREFILLED_SYRINGE | INTRAVENOUS | Status: DC | PRN
  Administered 2022-08-28: 50 mg via INTRAVENOUS

## 2022-08-28 MED ORDER — HYDROCODONE-ACETAMINOPHEN 5-325 MG PO TABS: 1.0000 | ORAL_TABLET | ORAL | 0 refills | Status: DC | PRN

## 2022-08-28 MED ORDER — EPHEDRINE SULFATE (PRESSORS) 50 MG/ML IJ SOLN
INTRAMUSCULAR | Status: DC | PRN
  Administered 2022-08-28: 5 mg via INTRAVENOUS

## 2022-08-28 MED ORDER — HYDROMORPHONE HCL 1 MG/ML IJ SOLN: 0.2500 mg | INTRAMUSCULAR | Status: DC | PRN

## 2022-08-28 MED ORDER — LIDOCAINE HCL (PF) 1 % IJ SOLN
INTRAMUSCULAR | Status: AC
  Filled 2022-08-28: qty 5

## 2022-08-28 MED ORDER — ONDANSETRON 4 MG PO TBDP: 4.0000 mg | ORAL_TABLET | Freq: Three times a day (TID) | ORAL | 0 refills | Status: DC | PRN

## 2022-08-28 MED ORDER — OXYCODONE HCL 5 MG PO TABS: 5.0000 mg | ORAL_TABLET | Freq: Once | ORAL | Status: DC | PRN

## 2022-08-28 MED ORDER — LIDOCAINE HCL (PF) 2 % IJ SOLN
INTRAMUSCULAR | Status: AC
  Filled 2022-08-28: qty 5

## 2022-08-28 MED ORDER — MIDAZOLAM HCL 2 MG/2ML IJ SOLN
INTRAMUSCULAR | Status: AC
  Administered 2022-08-28: 2 mg via INTRAVENOUS
  Filled 2022-08-28: qty 2

## 2022-08-28 MED ORDER — RINGERS IRRIGATION IR SOLN
Status: DC | PRN
  Administered 2022-08-28: 6000 mL
  Administered 2022-08-28: 12000 mL
  Administered 2022-08-28: 6000 mL

## 2022-08-28 MED ORDER — FENTANYL CITRATE (PF) 100 MCG/2ML IJ SOLN
INTRAMUSCULAR | Status: AC
  Filled 2022-08-28: qty 2

## 2022-08-28 MED ORDER — MIDAZOLAM HCL 2 MG/2ML IJ SOLN
INTRAMUSCULAR | Status: DC | PRN
  Administered 2022-08-28: 2 mg via INTRAVENOUS

## 2022-08-28 MED ORDER — MIDAZOLAM HCL 2 MG/2ML IJ SOLN: 2.0000 mg | Freq: Once | INTRAMUSCULAR | Status: AC

## 2022-08-28 MED ORDER — ROCURONIUM BROMIDE 10 MG/ML (PF) SYRINGE
PREFILLED_SYRINGE | INTRAVENOUS | Status: AC
  Filled 2022-08-28: qty 10

## 2022-08-28 MED ORDER — OXYCODONE HCL 5 MG/5ML PO SOLN: 5.0000 mg | Freq: Once | ORAL | Status: DC | PRN

## 2022-08-28 MED ORDER — ACETAMINOPHEN 10 MG/ML IV SOLN
INTRAVENOUS | Status: AC
  Filled 2022-08-28: qty 100

## 2022-08-28 MED ORDER — PHENYLEPHRINE HCL (PRESSORS) 10 MG/ML IV SOLN
INTRAVENOUS | Status: DC | PRN
  Administered 2022-08-28: 80 ug via INTRAVENOUS
  Administered 2022-08-28: 100 ug via INTRAVENOUS
  Administered 2022-08-28: 160 ug via INTRAVENOUS
  Administered 2022-08-28: 80 ug via INTRAVENOUS
  Administered 2022-08-28: 160 ug via INTRAVENOUS

## 2022-08-28 MED ORDER — MIDAZOLAM HCL 2 MG/2ML IJ SOLN
INTRAMUSCULAR | Status: AC
  Filled 2022-08-28: qty 2

## 2022-08-28 MED ORDER — PHENYLEPHRINE HCL (PRESSORS) 10 MG/ML IV SOLN
INTRAVENOUS | Status: AC
  Filled 2022-08-28: qty 1

## 2022-08-28 MED ORDER — GLYCOPYRROLATE 0.2 MG/ML IJ SOLN
INTRAMUSCULAR | Status: AC
  Filled 2022-08-28: qty 1

## 2022-08-28 MED ORDER — EPINEPHRINE PF 1 MG/ML IJ SOLN
INTRAMUSCULAR | Status: AC
  Filled 2022-08-28: qty 4

## 2022-08-28 MED ORDER — ONDANSETRON HCL 4 MG/2ML IJ SOLN
INTRAMUSCULAR | Status: DC | PRN
  Administered 2022-08-28: 4 mg via INTRAVENOUS

## 2022-08-28 MED ORDER — ACETAMINOPHEN 10 MG/ML IV SOLN
INTRAVENOUS | Status: DC | PRN
  Administered 2022-08-28: 1000 mg via INTRAVENOUS

## 2022-08-28 MED ORDER — PROPOFOL 10 MG/ML IV BOLUS
INTRAVENOUS | Status: DC | PRN
  Administered 2022-08-28: 150 mg via INTRAVENOUS

## 2022-08-28 MED ORDER — BUPIVACAINE HCL (PF) 0.5 % IJ SOLN
INTRAMUSCULAR | Status: AC
  Filled 2022-08-28: qty 30

## 2022-08-28 MED ORDER — EPHEDRINE 5 MG/ML INJ
INTRAVENOUS | Status: AC
  Filled 2022-08-28: qty 5

## 2022-08-28 MED ORDER — FENTANYL CITRATE (PF) 100 MCG/2ML IJ SOLN
INTRAMUSCULAR | Status: DC | PRN
  Administered 2022-08-28 (×2): 50 ug via INTRAVENOUS

## 2022-08-28 MED ORDER — LACTATED RINGERS IV SOLN
INTRAVENOUS | Status: DC | PRN
  Administered 2022-08-28: 4 mL

## 2022-08-28 MED ORDER — CEFAZOLIN SODIUM-DEXTROSE 2-4 GM/100ML-% IV SOLN
INTRAVENOUS | Status: AC
  Filled 2022-08-28: qty 100

## 2022-08-28 MED ORDER — BUPIVACAINE LIPOSOME 1.3 % IJ SUSP
INTRAMUSCULAR | Status: DC | PRN
  Administered 2022-08-28: 20 mL

## 2022-08-28 MED ORDER — FAMOTIDINE 20 MG PO TABS
ORAL_TABLET | ORAL | Status: AC
  Administered 2022-08-28: 20 mg via ORAL
  Filled 2022-08-28: qty 1

## 2022-08-28 MED ORDER — DEXAMETHASONE SODIUM PHOSPHATE 10 MG/ML IJ SOLN
INTRAMUSCULAR | Status: DC | PRN
  Administered 2022-08-28: 10 mg via INTRAVENOUS

## 2022-08-28 MED ORDER — ROCURONIUM BROMIDE 100 MG/10ML IV SOLN
INTRAVENOUS | Status: DC | PRN
  Administered 2022-08-28: 60 mg via INTRAVENOUS
  Administered 2022-08-28: 20 mg via INTRAVENOUS

## 2022-08-28 MED ORDER — SUGAMMADEX SODIUM 200 MG/2ML IV SOLN
INTRAVENOUS | Status: DC | PRN
  Administered 2022-08-28: 200 mg via INTRAVENOUS

## 2022-08-28 MED ORDER — PROPOFOL 10 MG/ML IV BOLUS
INTRAVENOUS | Status: AC
  Filled 2022-08-28: qty 20

## 2022-08-28 MED ORDER — CHLORHEXIDINE GLUCONATE 0.12 % MT SOLN
OROMUCOSAL | Status: AC
  Administered 2022-08-28: 15 mL via OROMUCOSAL
  Filled 2022-08-28: qty 15

## 2022-08-28 MED ORDER — ACETAMINOPHEN 500 MG PO TABS: 1000.0000 mg | ORAL_TABLET | Freq: Three times a day (TID) | ORAL | 2 refills | Status: DC

## 2022-08-28 MED ORDER — BUPIVACAINE LIPOSOME 1.3 % IJ SUSP
INTRAMUSCULAR | Status: AC
  Filled 2022-08-28: qty 20

## 2022-08-28 MED ORDER — PHENYLEPHRINE HCL-NACL 20-0.9 MG/250ML-% IV SOLN
INTRAVENOUS | Status: DC | PRN
  Administered 2022-08-28: 30 ug/min via INTRAVENOUS

## 2022-08-28 MED ORDER — ONDANSETRON HCL 4 MG/2ML IJ SOLN
INTRAMUSCULAR | Status: AC
  Filled 2022-08-28: qty 2

## 2022-08-28 MED ORDER — ASPIRIN 325 MG PO TBEC: 325.0000 mg | DELAYED_RELEASE_TABLET | Freq: Every day | ORAL | 0 refills | Status: AC

## 2022-08-28 MED ORDER — DEXAMETHASONE SODIUM PHOSPHATE 10 MG/ML IJ SOLN
INTRAMUSCULAR | Status: AC
  Filled 2022-08-28: qty 1

## 2022-08-28 SURGICAL SUPPLY — 89 items
ADAPTER IRRIG TUBE 2 SPIKE SOL (ADAPTER) ×2 IMPLANT
ADH SKN CLS APL DERMABOND .7 (GAUZE/BANDAGES/DRESSINGS)
ADPR TBG 2 SPK PMP STRL ASCP (ADAPTER) ×2
ANCH SUT 2 SWLK 19.1 CLS EYLT (Anchor) ×2 IMPLANT
ANCH SUT 2 SWLK 19.1 KNTLS (Orthopedic Implant) ×1 IMPLANT
ANCH SUT 2.9 PUSHLOCK ANCH (Orthopedic Implant) ×1 IMPLANT
ANCH SUT 2X2.3 2 STRN TPE (Anchor) ×2 IMPLANT
ANCH SUT 5 3.9 CRKSW KNTLS (Anchor) ×1 IMPLANT
ANCH SUT SWLK 19.1X4.75 (Anchor) ×1 IMPLANT
ANCHOR 3.9 PEEK CORKSCREW 5MTS (Anchor) IMPLANT
ANCHOR ICONIX SPEED 2.0 TAPE (Anchor) IMPLANT
ANCHOR SUT BIO SW 4.75X19.1 (Anchor) IMPLANT
ANCHOR SUT KL SWIVELCK5.5X19.1 (Orthopedic Implant) IMPLANT
ANCHOR SWIVELOCK BIO 4.75X19.1 (Anchor) ×1 IMPLANT
ANCHR SUT KL SWIVELCK 5.5X19.1 (Orthopedic Implant) ×1 IMPLANT
APL PRP STRL LF DISP 70% ISPRP (MISCELLANEOUS) ×1
BLADE SHAVER 4.5X7 STR FR (MISCELLANEOUS) ×1 IMPLANT
BNDG ADH 2 X3.75 FABRIC TAN LF (GAUZE/BANDAGES/DRESSINGS) ×1 IMPLANT
BNDG ADH XL 3.75X2 STRCH LF (GAUZE/BANDAGES/DRESSINGS) ×1
BUR BR 5.5 WIDE MOUTH (BURR) IMPLANT
CANNULA PART THRD DISP 5.75X7 (CANNULA) IMPLANT
CANNULA PARTIAL THREAD 2X7 (CANNULA) IMPLANT
CANNULA TWIST IN 8.25X9CM (CANNULA) IMPLANT
CHLORAPREP W/TINT 26 (MISCELLANEOUS) ×1 IMPLANT
COOLER POLAR GLACIER W/PUMP (MISCELLANEOUS) ×1 IMPLANT
DERMABOND ADVANCED .7 DNX12 (GAUZE/BANDAGES/DRESSINGS) IMPLANT
DEVICE SUCT BLK HOLE OR FLOOR (MISCELLANEOUS) ×1 IMPLANT
DRAPE 3/4 80X56 (DRAPES) ×1 IMPLANT
DRAPE INCISE IOBAN 66X45 STRL (DRAPES) ×1 IMPLANT
DRAPE U-SHAPE 47X51 STRL (DRAPES) ×2 IMPLANT
DRSG TEGADERM 4X4.75 (GAUZE/BANDAGES/DRESSINGS) ×3 IMPLANT
ELECT REM PT RETURN 9FT ADLT (ELECTROSURGICAL) ×1
ELECTRODE REM PT RTRN 9FT ADLT (ELECTROSURGICAL) ×1 IMPLANT
GAUZE SPONGE 4X4 12PLY STRL (GAUZE/BANDAGES/DRESSINGS) ×1 IMPLANT
GAUZE XEROFORM 1X8 LF (GAUZE/BANDAGES/DRESSINGS) ×1 IMPLANT
GLOVE BIO SURGEON STRL SZ7.5 (GLOVE) ×1 IMPLANT
GLOVE BIOGEL PI IND STRL 8 (GLOVE) ×2 IMPLANT
GLOVE SURG ORTHO 8.0 STRL STRW (GLOVE) ×1 IMPLANT
GLOVE SURG SYN 8.0 (GLOVE) ×2 IMPLANT
GLOVE SURG SYN 8.0 PF PI (GLOVE) ×1 IMPLANT
GOWN STRL REUS W/ TWL LRG LVL3 (GOWN DISPOSABLE) ×2 IMPLANT
GOWN STRL REUS W/TWL LRG LVL3 (GOWN DISPOSABLE) ×2
GOWN STRL REUS W/TWL XL LVL4 (GOWN DISPOSABLE) ×1 IMPLANT
IV LACTATED RINGER IRRG 3000ML (IV SOLUTION) ×4
IV LR IRRIG 3000ML ARTHROMATIC (IV SOLUTION) ×4 IMPLANT
KIT CORKSCREW KNTLS 3.9 S/T/P (INSTRUMENTS) IMPLANT
KIT STABILIZATION SHOULDER (MISCELLANEOUS) ×1 IMPLANT
KIT SUTURETAK 3.0 INSERT PERC (KITS) IMPLANT
KIT TURNOVER KIT A (KITS) ×1 IMPLANT
MANIFOLD NEPTUNE II (INSTRUMENTS) ×2 IMPLANT
MASK FACE SPIDER DISP (MASK) ×1 IMPLANT
MAT ABSORB  FLUID 56X50 GRAY (MISCELLANEOUS) ×2
MAT ABSORB FLUID 56X50 GRAY (MISCELLANEOUS) ×2 IMPLANT
NDL MAYO CATGUT SZ5 (NEEDLE)
NDL SAFETY ECLIP 18X1.5 (MISCELLANEOUS) ×1 IMPLANT
NDL SCORPION MULTI FIRE (NEEDLE) IMPLANT
NDL SUT 5 .5 CRC TPR PNT MAYO (NEEDLE) IMPLANT
NEEDLE SCORPION MULTI FIRE (NEEDLE) IMPLANT
PACK ARTHROSCOPY SHOULDER (MISCELLANEOUS) ×1 IMPLANT
PAD ARMBOARD 7.5X6 YLW CONV (MISCELLANEOUS) ×1 IMPLANT
PAD WRAPON POLAR SHDR XLG (MISCELLANEOUS) ×1 IMPLANT
PASSER SUT FIRSTPASS SELF (INSTRUMENTS) ×1 IMPLANT
PASSER SUT SWIFTSTITCH HIP CRT (INSTRUMENTS) ×1 IMPLANT
SHAVER BLADE BONE CUTTER  5.5 (BLADE)
SHAVER BLADE BONE CUTTER 5.5 (BLADE) ×1 IMPLANT
SLEEVE REMOTE CONTROL 5X12 (DRAPES) IMPLANT
SLING ULTRA II M (MISCELLANEOUS) ×1 IMPLANT
SPONGE T-LAP 18X18 ~~LOC~~+RFID (SPONGE) ×1 IMPLANT
STAPLER SKIN PROX 35W (STAPLE) IMPLANT
STRAP SAFETY 5IN WIDE (MISCELLANEOUS) ×1 IMPLANT
SUT ETHILON 3-0 (SUTURE) ×1 IMPLANT
SUT LASSO 90 DEG CVD (SUTURE) IMPLANT
SUT LASSO 90 DEG SD STR (SUTURE) IMPLANT
SUT MNCRL 4-0 (SUTURE)
SUT MNCRL 4-0 27XMFL (SUTURE)
SUT PROLENE 0 CT 2 (SUTURE) IMPLANT
SUT VIC AB 0 CT1 36 (SUTURE) IMPLANT
SUT VIC AB 2-0 CT2 27 (SUTURE) IMPLANT
SUTURE MNCRL 4-0 27XMF (SUTURE) IMPLANT
SYSTEM IMPL TENODESIS LNT 2.9 (Orthopedic Implant) IMPLANT
TAPE CLOTH 3X10 WHT NS LF (GAUZE/BANDAGES/DRESSINGS) ×1 IMPLANT
TAPE MICROFOAM 4IN (TAPE) ×1 IMPLANT
TRAP FLUID SMOKE EVACUATOR (MISCELLANEOUS) ×1 IMPLANT
TUBING CONNECTING 10 (TUBING) IMPLANT
TUBING INFLOW SET DBFLO PUMP (TUBING) ×1 IMPLANT
TUBING OUTFLOW SET DBLFO PUMP (TUBING) ×1 IMPLANT
WAND WEREWOLF FLOW 90D (MISCELLANEOUS) ×1 IMPLANT
WATER STERILE IRR 500ML POUR (IV SOLUTION) ×1 IMPLANT
WRAPON POLAR PAD SHDR XLG (MISCELLANEOUS) ×1

## 2022-08-28 NOTE — Anesthesia Procedure Notes (Signed)
Anesthesia Regional Block: Interscalene brachial plexus block   Pre-Anesthetic Checklist: , timeout performed,  Correct Patient, Correct Site, Correct Laterality,  Correct Procedure, Correct Position, site marked,  Risks and benefits discussed,  Surgical consent,  Pre-op evaluation,  At surgeon's request and post-op pain management  Laterality: Right  Prep: alcohol swabs       Needles:  Injection technique: Single-shot  Needle Type: Echogenic Needle     Needle Length: 4cm  Needle Gauge: 25     Additional Needles:   Procedures:,,,, ultrasound used (permanent image in chart),,    Narrative:  Start time: 08/28/2022 7:40 AM End time: 08/28/2022 7:47 AM Injection made incrementally with aspirations every 5 mL.  Performed by: Personally  Anesthesiologist: Ilene Qua, MD  Additional Notes: Patient's chart reviewed and they were deemed appropriate candidate for procedure, at surgeon's request. Patient educated about risks, benefits, and alternatives of the block including but not limited to: temporary or permanent nerve damage, bleeding, infection, damage to surround tissues, pneumothorax, hemidiaphragmatic paralysis, unilateral Horner's syndrome, block failure, local anesthetic toxicity. Patient expressed understanding. A formal time-out was conducted consistent with institution rules.  Monitors were applied, and minimal sedation used (see nursing record). The site was prepped with skin prep and allowed to dry, and sterile gloves were used. A high frequency linear ultrasound probe with probe cover was utilized throughout. C5-7 nerve roots located and appeared anatomically normal, local anesthetic injected around them, and echogenic block needle trajectory was monitored throughout. Aspiration performed every 63m. Lung and blood vessels were avoided. All injections were performed without resistance and free of blood and paresthesias. The patient tolerated the procedure  well.  Injectate: 265mexparel

## 2022-08-28 NOTE — Discharge Instructions (Addendum)
Post-Op Instructions - Rotator Cuff Repair  1. Bracing: You will wear a shoulder immobilizer or sling for 4 weeks.   2. Driving: No driving for 4 weeks post-op.  3. Activity: No active lifting for 2 months. Wrist, hand, and elbow motion only. Avoid lifting the upper arm away from the body except for hygiene. You are permitted to bend and straighten the elbow passively only (no active elbow motion). You may use your hand and wrist for typing, writing, and managing utensils (cutting food). Do not lift more than a coffee cup for 8 weeks.  When sleeping or resting, inclined positions (recliner chair or wedge pillow) and a pillow under the forearm for support may provide better comfort for up to 4 weeks.  Avoid long distance travel for 4 weeks.  Return to normal activities after rotator cuff repair repair normally takes 6 months on average. If rehab goes very well, may be able to do most activities at 4 months, except overhead or contact sports.  4. Physical Therapy: Begins 3-4 days after surgery, and proceed 1 time per week for the first 6 weeks, then 1-2 times per week from weeks 6-20 post-op.  5. Medications:  - You will be provided a prescription for narcotic pain medicine. After surgery, take 1-2 narcotic tablets every 4 hours if needed for severe pain.  - A prescription for anti-nausea medication will be provided in case the narcotic medicine causes nausea - take 1 tablet every 6 hours only if nauseated.   - Take tylenol 1000 mg (2 Extra Strength tablets or 3 regular strength) every 8 hours for pain.  May decrease or stop tylenol 5 days after surgery if you are having minimal pain. - Take ASA '325mg'$ /day x 2 weeks to help prevent DVTs/PEs (blood clots).  - DO NOT take ANY nonsteroidal anti-inflammatory pain medications (Advil, Motrin, Ibuprofen, Aleve, Naproxen, or Naprosyn). These medicines can inhibit healing of your shoulder repair.    If you are taking prescription medication for anxiety,  depression, insomnia, muscle spasm, chronic pain, or for attention deficit disorder, you are advised that you are at a higher risk of adverse effects with use of narcotics post-op, including narcotic addiction/dependence, depressed breathing, death. If you use non-prescribed substances: alcohol, marijuana, cocaine, heroin, methamphetamines, etc., you are at a higher risk of adverse effects with use of narcotics post-op, including narcotic addiction/dependence, depressed breathing, death. You are advised that taking > 50 morphine milligram equivalents (MME) of narcotic pain medication per day results in twice the risk of overdose or death. For your prescription provided: oxycodone 5 mg - taking more than 6 tablets per day would result in > 50 morphine milligram equivalents (MME) of narcotic pain medication. Be advised that we will prescribe narcotics short-term, for acute post-operative pain only - 3 weeks for major operations such as shoulder repair/reconstruction surgeries.     6. Post-Op Appointment:  Your first post-op appointment will be 10-14 days post-op.  7. Work or School: For most, but not all procedures, we advise staying out of work or school for at least 1 to 2 weeks in order to recover from the stress of surgery and to allow time for healing.   If you need a work or school note this can be provided.   8. Smoking: If you are a smoker, you need to refrain from smoking in the postoperative period. The nicotine in cigarettes will inhibit healing of your shoulder repair and decrease the chance of successful repair. Similarly, nicotine containing products (  gum, patches) should be avoided.   Post-operative Brace: Apply and remove the brace you received as you were instructed to at the time of fitting and as described in detail as the brace's instructions for use indicate.  Wear the brace for the period of time prescribed by your physician.  The brace can be cleaned with soap and water and  allowed to air dry only.  Should the brace result in increased pain, decreased feeling (numbness/tingling), increased swelling or an overall worsening of your medical condition, please contact your doctor immediately.  If an emergency situation occurs as a result of wearing the brace after normal business hours, please dial 911 and seek immediate medical attention.  Let your doctor know if you have any further questions about the brace issued to you. Refer to the shoulder sling instructions for use if you have any questions regarding the correct fit of your shoulder sling.  [facility]: [phone]  Video that illustrates how to properly use a shoulder sling: "Instructions for Proper Use of an Orthopaedic Sling" [email or web address]    AMBULATORY SURGERY  DISCHARGE INSTRUCTIONS   The drugs that you were given will stay in your system until tomorrow so for the next 24 hours you should not:  Drive an automobile Make any legal decisions Drink any alcoholic beverage   You may resume regular meals tomorrow.  Today it is better to start with liquids and gradually work up to solid foods.  You may eat anything you prefer, but it is better to start with liquids, then soup and crackers, and gradually work up to solid foods.   Please notify your doctor immediately if you have any unusual bleeding, trouble breathing, redness and pain at the surgery site, drainage, fever, or pain not relieved by medication.     Information for Discharge Teaching: EXPAREL (bupivacaine liposome injectable suspension)   Your surgeon or anesthesiologist gave you EXPAREL(bupivacaine) to help control your pain after surgery.  EXPAREL is a local anesthetic that provides pain relief by numbing the tissue around the surgical site. EXPAREL is designed to release pain medication over time and can control pain for up to 72 hours. Depending on how you respond to EXPAREL,  you may require less pain medication during your recovery.  Possible side effects: Temporary loss of sensation or ability to move in the area where bupivacaine was injected. Nausea, vomiting, constipation Rarely, numbness and tingling in your mouth or lips, lightheadedness, or anxiety may occur. Call your doctor right away if you think you may be experiencing any of these sensations, or if you have other questions regarding possible side effects.  Follow all other discharge instructions given to you by your surgeon or nurse. Eat a healthy diet and drink plenty of water or other fluids.  If you return to the hospital for any reason within 96 hours following the administration of EXPAREL, it is important for health care providers to know that you have received this anesthetic. A teal colored band has been placed on your arm with the date, time and amount of EXPAREL you have received in order to alert and inform your health care providers. Please leave this armband in place for the full 96 hours following administration, and then you may remove the band.  POLAR CARE INFORMATION  [email or web address]  How to use [place] Cold Therapy System?  YouTube   [email or web address]  OPERATING INSTRUCTIONS  Start the product With dry hands, connect the transformer to the electrical  connection located on the top of the cooler. Next, plug the transformer into an appropriate electrical outlet. The unit will automatically start running at this point.  To stop the pump, disconnect electrical power.  Unplug to stop the product when not in use. Unplugging the Polar Care unit turns it off. Always unplug immediately after use. Never leave it plugged in while unattended. Remove pad.    FIRST ADD WATER TO FILL LINE, THEN ICE---Replace ice when existing ice is almost melted  1 Discuss Treatment with your [place] Practitioner and Use Only as Prescribed 2 Apply Insulation  Barrier & Cold Therapy Pad 3 Check for Moisture 4 Inspect Skin Regularly  Tips and Trouble Shooting Usage Tips 1. Use cubed or chunked ice for optimal performance. 2. It is recommended to drain the Pad between uses. To drain the pad, hold the Pad upright with the hose pointed toward the ground. Depress the black plunger and allow water to drain out. 3. You may disconnect the Pad from the unit without removing the pad from the affected area by depressing the silver tabs on the hose coupling and gently pulling the hoses apart. The Pad and unit will seal itself and will not leak. Note: Some dripping during release is normal. 4. DO NOT RUN PUMP WITHOUT WATER! The pump in this unit is designed to run with water. Running the unit without water will cause permanent damage to the pump. 5. Unplug unit before removing lid.  TROUBLESHOOTING GUIDE Pump not running, Water not flowing to the pad, Pad is not getting cold 1. Make sure the transformer is plugged into the wall outlet. 2. Confirm that the ice and water are filled to the indicated levels. 3. Make sure there are no kinks in the pad. 4. Gently pull on the blue tube to make sure the tube/pad junction is straight. 5. Remove the pad from the treatment site and ll it while the pad is lying at; then reapply. 6. Confirm that the pad couplings are securely attached to the unit. Listen for the double clicks (Figure 1) to confirm the pad couplings are securely attached.  Leaks    Note: Some condensation on the lines, controller, and pads is unavoidable, especially in warmer climates. 1. If using a [place] Polar Care Cold Therapy unit with a detachable Cold Therapy Pad, and a leak exists (other than condensation on the lines) disconnect the pad couplings. Make sure the silver tabs on the couplings are depressed before reconnecting the pad to the pump hose; then confirm both sides of the coupling are properly clicked in. 2. If the coupling continues to leak or a  leak is detected in the pad itself, stop using it and call [place] at [phone].  Cleaning After use, empty and dry the unit with a soft cloth. Warm water and mild detergent may be used occasionally to clean the pump and tubes.  WARNING: The [place] can be cold enough to cause serious injury, including full skin necrosis. Follow these Operating Instructions, and carefully read the Product Insert (see pouch on side of unit) and the Cold Therapy Pad Fitting Instructions (provided with each Cold Therapy Pad) prior to use.

## 2022-08-28 NOTE — Anesthesia Postprocedure Evaluation (Signed)
Anesthesia Post Note  Patient: Michelle Norris  Procedure(s) Performed: Right shoulder arthroscopic rotator cuff repair, subacromial decompression, distal clavicle excision, and biceps tenodesis (Right)  Patient location during evaluation: PACU Anesthesia Type: Regional and General Level of consciousness: awake and alert Pain management: pain level controlled Vital Signs Assessment: post-procedure vital signs reviewed and stable Respiratory status: spontaneous breathing, nonlabored ventilation, respiratory function stable and patient connected to nasal cannula oxygen Cardiovascular status: blood pressure returned to baseline and stable Postop Assessment: no apparent nausea or vomiting Anesthetic complications: no   No notable events documented.   Last Vitals:  Vitals:   08/28/22 0740 08/28/22 0750  BP: 113/76 109/82  Pulse:    Resp:    Temp:    SpO2:      Last Pain:  Vitals:   08/28/22 0632  TempSrc: Oral  PainSc: 5                  Ilene Qua

## 2022-08-28 NOTE — H&P (Signed)
Paper H&P to be scanned into permanent record. H&P reviewed. No significant changes noted.  

## 2022-08-28 NOTE — Op Note (Addendum)
SURGERY DATE: 08/28/2022   PRE-OP DIAGNOSIS:  1. Right subacromial impingement 2. Right biceps tendinopathy 3. Right rotator cuff tear 4. Right acromioclavicular joint arthritis  POST-OP DIAGNOSIS: 1. Right subacromial impingement 2. Right biceps tendinopathy 3. Right rotator cuff tear (supraspinatus and partial-thickness upper border subscapularis) 4. Right acromioclavicular joint arthritis  PROCEDURES:  1. Right arthroscopic rotator cuff repair (upper border subscapularis and full-thickness supraspinatus) 2. Right arthroscopic biceps tenodesis 3. Right arthroscopic subacromial decompression 4. Right arthroscopic extensive debridement of shoulder (glenohumeral and subacromial spaces) 5. Right arthroscopic distal clavicle excision  SURGEON: Cato Mulligan, MD   ASSISTANT: Anitra Lauth, PA   ANESTHESIA: Gen with Exparel interscalene block   ESTIMATED BLOOD LOSS: 5cc   DRAINS:  none   TOTAL IV FLUIDS: per anesthesia      SPECIMENS: none   IMPLANTS:  - Arthrex 2.74m PushLock x 1 - Arthrex 3.956mKnotless Corkscrew x 1 - Arthrex 4.7586mwiveLock x 3 - Arthrex 5.5mm36miveLock x 1 - Iconix SPEED double loaded with x 1     OPERATIVE FINDINGS:  Examination under anesthesia: A careful examination under anesthesia was performed.  Passive range of motion was: FF: 150; ER at side: 45; ER in abduction: 90; IR in abduction: 45.  Anterior load shift: NT.  Posterior load shift: NT.  Sulcus in neutral: NT.  Sulcus in ER: NT.     Intra-operative findings: A thorough arthroscopic examination of the shoulder was performed.  The findings are: 1. Biceps tendon: tendinopathy with mild erythema  2. Superior labrum: erythema 3. Posterior labrum and capsule: normal 4. Inferior capsule and inferior recess: normal 5. Glenoid cartilage surface: Normal 6. Supraspinatus attachment: full-thickness tear of the supraspinatus 7. Posterior rotator cuff attachment: normal 8. Humeral head articular  cartilage: normal 9. Rotator interval: significant synovitis 10: Subscapularis tendon: Partial-thickness upper border tear 11. Anterior labrum: Mildly degenerative and erythematous 12. IGHL: normal   OPERATIVE REPORT:    Indications for procedure:  Michelle Norris 51 y73. female with approximately 6 months of right shoulder pain that may have began after she underwent a surgical procedure.  She has had difficulty with overhead motion since that time with sensations of weakness as well as significant pain. Clinical exam and MRI were suggestive of rotator cuff tear, biceps tendinopathy, acromioclavicular joint arthritis and subacromial impingement. After discussion of risks, benefits, and alternatives to surgery, the patient elected to proceed.    Procedure in detail:   I identified Michelle GALGANOthe pre-operative holding area.  I marked the operative shoulder with my initials. I reviewed the risks and benefits of the proposed surgical intervention, and the patient wished to proceed.  Anesthesia was then performed with an Exparel interscalene block.  The patient was transferred to the operative suite and placed in the beach chair position.     Appropriate IV antibiotics were administered prior to incision. The operative upper extremity was then prepped and draped in standard fashion. A time out was performed confirming the correct extremity, correct patient, and correct procedure.    I then created a standard posterior portal with an 11 blade. The glenohumeral joint was easily entered with a blunt trocar and the arthroscope introduced. The findings of diagnostic arthroscopy are described above. I debrided degenerative tissue including the synovitic tissue about the rotator interval and anterior and superior labrum. I then coagulated the inflamed synovium to obtain hemostasis and reduce the risk of post-operative swelling using an Arthrocare radiofrequency device.  I then turned my attention  to the arthroscopic biceps tenodesis. The Loop n Tack technique was used to pass a FiberTape through the biceps in a locked fashion adjacent to the biceps anchor.  A hole for a 2.9 mm Arthrex PushLock was drilled in the bicipital groove just superior to the subscapularis tendon insertion.  The biceps tendon was then cut and the biceps anchor complex was debrided down to a stable base on the superior labrum.  The FiberTape was loaded onto the PushLock anchor and impacted into place into the previously drilled hole in the bicipital groove.  This appropriately secured the biceps into the bicipital groove and took it off of tension.   Next, arthroscopic repair of the subscapularis was performed. The lesser tuberosity footprint was prepared with a combination of electrocautery and an arthroscopic curette.  An Arthrex knotless corkscrew was placed into the lesser tuberosity footprint from the anterior portal.  A BirdBeak was used to shuttle the repair suture through the upper border of the subscapularis tendon.  The suture was then shuttled through the anchor. With the arm in neutral rotation, the repair was tensioned appropriately. This appropriately reduced the subscapularis tear.  The arm was then internally and externally rotated and the subscapularis was noted to move appropriately with rotation.  The remainder of the suture was then cut.  Next, the arthroscope was then introduced into the subacromial space. A direct lateral portal was created with an 11-blade after spinal needle localization. An extensive subacromial bursectomy and debridement was performed using a combination of the shaver and Arthrocare wand. The entire acromial undersurface was exposed and the CA ligament was subperiosteally elevated to expose the anterior acromial hook. A burr was used to create a flat anterior and lateral aspect of the acromion, converting it from a Type 2 to a Type 1 acromion. Care was made to keep the deltoid fascia  intact.   I then turned my attention to the arthroscopic distal clavicle excision. I identified the acromioclavicular joint. Surrounding bursal tissue was debrided and the edges of the joint were identified. I used the 5.55m barrel burr to remove the distal clavicle parallel to the edge of the acromion. I was able to fit two widths of the burr into the space between the distal clavicle and acromion, signifying that I had removed ~158mof distal clavicle. This was confirmed by viewing anteriorly and introducing a probe with measuring marks from the lateral portal. Hemostasis was achieved with an Arthrocare wand.   Next, I created an accessory posterolateral portal to assist with visualization and instrumentation.  I debrided the poor quality edges of the supraspinatus tendon.  This was a U-shaped tear of the supraspinatus.  I prepared the footprint using a burr to expose bleeding bone.     I then percutaneously placed 1 Iconix SPEED medial row anchor along the anterior portion of the tear at the articular margin. Another SPEED anchor was placed along the posterior portion of the tear at the articular margin.  Given the relatively small size of the tear, I removed 1 set of suture tapes from each anchor.  I then shuttled all 4 remaining strands of tape through the rotator cuff just lateral to the musculotendinous junction using a FirstPass suture passer spanning the anterior to posterior extent of the tear.  However, the anterior anchor became unloaded and a 4.75 SwiveLock anchor loaded with the same 2.0 mm tape was placed and sutures were repassed through the anterior cuff.  Next, the  posterior strands of each suture were passed through an [employer] anchor.  This was placed approximately 2 cm distal to the lateral edge of the footprint in line with the posterior aspect of the tear with appropriate tensioning of each suture prior to final fixation.  This did not achieve appropriate fixation due to  patient's bone quality.  4.75 mm SwiveLock anchor was replaced with a 5.5 mm anchor.  This did achieve appropriate fixation.  Similarly, the anterior strands of each suture were passed through another SwiveLock anchor along the anterior margin of the tear.  This construct allowed for excellent reapproximation of the rotator cuff to its native footprint without undue tension.  Appropriate compression was achieved.  The repair was stable to external and internal rotation.   Fluid was evacuated from the shoulder, and the portals were closed with 3-0 Nylon. Xeroform was applied to the portals. A sterile dressing was applied, followed by a Polar Care sleeve and a SlingShot shoulder immobilizer/sling. The patient was awakened from anesthesia without difficulty and was transferred to the PACU in stable condition.    Of note, assistance from a PA was essential to performing the surgery.  PA was present for the entire surgery.  PA assisted with patient positioning, retraction, instrumentation, and wound closure. The surgery would have been more difficult and had longer operative time without PA assistance.   Additionally, this case had increased complexity compared to standard arthroscopic rotator cuff repair given the involvement of the subscapularis tear. Repair of this tear increased surgical time by 20 minutes and increased complexity due to additional preparation and repair of subscapularis tear and use of additional implants, which would have otherwise not occurred.  COMPLICATIONS: none   DISPOSITION: plan for discharge home after recovery in PACU     POSTOPERATIVE PLAN: Remain in sling (except hygiene and elbow/wrist/hand RoM exercises as instructed by PT) x 4 weeks and NWB for this time. PT to begin 3-4 days after surgery.  Medium rotator cuff repair +subscapularis restrictions rehab protocol. ASA '325mg'$  daily x 2 weeks for DVT ppx.

## 2022-08-28 NOTE — Anesthesia Procedure Notes (Signed)
Procedure Name: Intubation Date/Time: 08/28/2022 8:18 AM  Performed by: Rolla Plate, CRNAPre-anesthesia Checklist: Patient identified, Patient being monitored, Timeout performed, Emergency Drugs available and Suction available Patient Re-evaluated:Patient Re-evaluated prior to induction Oxygen Delivery Method: Circle system utilized Preoxygenation: Pre-oxygenation with 100% oxygen Induction Type: IV induction Ventilation: Mask ventilation without difficulty Laryngoscope Size: 3 and McGraph Grade View: Grade I Tube type: Oral Tube size: 7.0 mm Number of attempts: 1 Airway Equipment and Method: Stylet Placement Confirmation: ETT inserted through vocal cords under direct vision, positive ETCO2 and breath sounds checked- equal and bilateral Secured at: 20 cm Tube secured with: Tape Dental Injury: Teeth and Oropharynx as per pre-operative assessment

## 2022-08-28 NOTE — Progress Notes (Signed)
Patient awake/alert x4. Tolerated water and crackers without event. Denies pain. Verbalizes understanding of procedure and follow up, reviewed in pacu. CMS+ RUE

## 2022-08-28 NOTE — Anesthesia Preprocedure Evaluation (Addendum)
Anesthesia Evaluation  Patient identified by MRN, date of birth, ID band Patient awake    Reviewed: Allergy & Precautions, NPO status , Patient's Chart, lab work & pertinent test results  History of Anesthesia Complications Negative for: history of anesthetic complications  Airway Mallampati: III  TM Distance: >3 FB Neck ROM: full    Dental  (+) Chipped   Pulmonary sleep apnea and Oxygen sleep apnea    Pulmonary exam normal        Cardiovascular hypertension, On Medications and On Home Beta Blockers + CAD and +CHF  + dysrhythmias (s/p watchman) Atrial Fibrillation and Supra Ventricular Tachycardia   03/01/2022 -- Echocardiogram (CE): No significant pericardial effusion. Left to right shunt post transeptal puncture. Left ventricular ejection fraction, by estimation, is 35 to 40%. The left ventricle has normal function. Right ventricular systolic function is normal. The right ventricular size is normal. Left atrial size was mildly dilated. No left atrial/left atrial appendage thrombus was detected. Right atrial size was mildly dilated. The mitral valve is normal in structure. Trivial mitral valve regurgitation. No evidence of mitral stenosis. The aortic valve is tricuspid. Aortic valve regurgitation is not visualized. No aortic stenosis is present.   03/27/22 interrogation --  HR 43 - 169bpm, average 70 bpm. 5 nonsustained VT, longest 18 beats 4 nonsustained SVT, longest 19 beats Rare supraventricular ectopy. Occasional ventricular ectopy, 2.1%. No sustained arrhythmias. Symptom triggered episodes correspond to sinus rhythm +/- PVC's.     Neuro/Psych  Neuromuscular disease CVA (frequent falls), Residual Symptoms  negative psych ROS   GI/Hepatic negative GI ROS, Neg liver ROS,,,  Endo/Other  negative endocrine ROS    Renal/GU      Musculoskeletal   Abdominal   Peds  Hematology negative hematology ROS (+) Plavix     Anesthesia Other Findings Past Medical History: No date: Aortic atherosclerosis (HCC) No date: Atrial fibrillation ([place])     Comment:  a.) CHA2DS2VASc = 6 (sex, CHF, HTN, CVA x2, vascular               disease history);  b.) s/p DCCV (150 J x1) 12/02/2017;               c.) s/p Watchman device placement 03/01/2022; d.)               rate/rhythm maintained now without pharmacological               intervention; chronically anticoagulated with clopidogrel 08/03/2021: Atypical mole     Comment:  L sacral, moderate atypia No date: Bradycardia No date: CAD (coronary artery disease)     Comment:  a.) LHC 10/15/2017: EF 35-45%, LVEDP norm, normal               coronaries; b.) cCTA 08/10/2021: EF 67%. 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 No date: Hyperlipidemia No date: Hypertension 07/2018: Leukocytosis     Comment:  being monitored by pcp No date: Long term current use of antithrombotics/antiplatelets  Comment:  a.) clopidogrel No date: Migraines No date: NICM (nonischemic cardiomyopathy) ([place])     Comment:  a.) TTE 09/17/2017: EF 30%; b.) MPI 09/20/2017: EF               45-50%; c.) LHC 10/15/2017: EF 35-45%; d.) TTE               03/23/2020: EF 45%; e.) TTE 05/08/2021: EF 50%; f.) cCTA               08/10/2021: EF 67% 03/26/2022: NSVT (nonsustained ventricular tachycardia) ([place])     Comment:  a.) Holter 03/26/2022 - 5 runs of NSVT with the longest               lasting 18 beats No date: Obesity No date: OSA (obstructive sleep apnea)     Comment:  a.) no nocturnal PAP therapy; has Inspire implant No date: Osteoarthritis No date: Peripheral  edema 03/01/2022: Presence of Watchman left atrial appendage closure device     Comment:  a.) s/p Watchman FLX 27 mm device placement 03/26/2022: PSVT (paroxysmal supraventricular tachycardia)     Comment:  a.) Holter 03/26/2022 - 4 runs of SVT with the longest               lasting 19 beats 06/2018: [facility] spotted fever     Comment:  bulls-eye rash on back No date: Vitamin B12 deficiency  Past Surgical History: 08/25/2018: BICEPT TENODESIS; Left     Comment:  Procedure: BICEPS TENODESIS;  Surgeon: Leim Fabry, MD;              Location: [facility] ORS;  Service: Orthopedics;  Laterality:               Left; 12/02/2017: CARDIOVERSION; N/A     Comment:  Procedure: CARDIOVERSION;  Surgeon: Yolonda Kida,               MD;  Location: [facility] ORS;  Service: Cardiovascular;                Laterality: N/A; 03/01/2022: LEFT ATRIAL APPENDAGE OCCLUSION; N/A     Comment:  Procedure: LEFT ATRIAL APPENDAGE OCCLUSION;  Surgeon:               Vickie Epley, MD;  Location: [place] [facility];                Service: Cardiovascular;  Laterality: N/A; 10/15/2017: LEFT HEART CATH AND CORONARY ANGIOGRAPHY; Left     Comment:  Procedure: LEFT HEART CATH AND CORONARY ANGIOGRAPHY;                Surgeon: Yolonda Kida, MD;  Location: [place]              CV [facility];  Service: Cardiovascular;  Laterality: Left; 10/15/2017: LEFT HEART CATH AND CORONARY ANGIOGRAPHY; Left     Comment:  Procedure: LEFT HEART CATH AND CORONARY ANGIOGRAPHY;               Location: [place]; Surgeon: Katrine Coho, MD 2004: ORIF [place]; Right     Comment:  fall on right ; ANKLE FRACTURE 08/25/2018: SHOULDER ARTHROSCOPY WITH OPEN ROTATOR CUFF REPAIR; Left     Comment:  Procedure: SHOULDER ARTHROSCOPY WITH SUBCROMIAL               DECOMPRESSION  DISTAL CLAVICLE EXCISION VS.OPEN ROTATOR  CUFF REPAIR;  Surgeon: Leim Fabry, MD;  Location: [facility]               ORS;  Service: Orthopedics;   Laterality: Left; 2013: SUPRACERVICAL ABDOMINAL HYSTERECTOMY; N/A 03/01/2022: TEE WITHOUT CARDIOVERSION; N/A     Comment:  Procedure: TRANSESOPHAGEAL ECHOCARDIOGRAM (TEE);                Surgeon: Vickie Epley, MD;  Location: [place]               CV LAB;  Service: Cardiovascular;  Laterality: N/A; No date: TEMPOROMANDIBULAR JOINT ARTHROSCOPY; Bilateral     Comment:  1994  BMI    Body Mass Index: 28.46 kg/m      Reproductive/Obstetrics negative OB ROS                             Anesthesia Physical Anesthesia Plan  ASA: 3  Anesthesia Plan: General LMA   Post-op Pain Management: Regional block*, Dilaudid IV, Ketamine IV* and Toradol IV (intra-op)*   Induction: Intravenous  PONV Risk Score and Plan: 3 and Dexamethasone, Ondansetron, Midazolam and Treatment may vary due to age or medical condition  Airway Management Planned: LMA  Additional Equipment:   Intra-op Plan:   Post-operative Plan: Extubation in OR  Informed Consent: I have reviewed the patients History and Physical, chart, labs and discussed the procedure including the risks, benefits and alternatives for the proposed anesthesia with the patient or authorized representative who has indicated his/her understanding and acceptance.     Dental Advisory Given  Plan Discussed with: Anesthesiologist, CRNA and Surgeon  Anesthesia Plan Comments: (Patient consented for risks of anesthesia including but not limited to:  - adverse reactions to medications - damage to eyes, teeth, lips or other oral mucosa - nerve damage due to positioning  - sore throat or hoarseness - Damage to heart, brain, nerves, lungs, other parts of body or loss of life  Patient voiced understanding.)       Anesthesia Quick Evaluation

## 2022-08-28 NOTE — Transfer of Care (Signed)
Immediate Anesthesia Transfer of Care Note  Patient: Michelle Norris  Procedure(s) Performed: Right shoulder arthroscopic rotator cuff repair, subacromial decompression, distal clavicle excision, and biceps tenodesis (Right)  Patient Location: PACU  Anesthesia Type:GA combined with regional for post-op pain  Level of Consciousness: awake and oriented  Airway & Oxygen Therapy: Patient Spontanous Breathing and Patient connected to face mask oxygen  Post-op Assessment: Report given to RN and Post -op Vital signs reviewed and stable  Post vital signs: Reviewed  Last Vitals:  Vitals Value Taken Time  BP    Temp    Pulse 77 08/28/22 1056  Resp 17 08/28/22 1056  SpO2 99 % 08/28/22 1056  Vitals shown include unvalidated device data.  Last Pain:         Complications: No notable events documented.

## 2022-08-29 ENCOUNTER — Encounter: Payer: Self-pay | Admitting: Orthopedic Surgery

## 2022-09-03 ENCOUNTER — Ambulatory Visit: Payer: No Typology Code available for payment source

## 2022-09-06 ENCOUNTER — Ambulatory Visit: Payer: Self-pay | Admitting: Surgery

## 2022-09-06 NOTE — H&P (Signed)
ubjective:    CC: Biliary colic [H67.59]   HPI:  Michelle Norris is a 51 y.o. female who was referred by Karma Greaser for evaluation of above CC. Symptoms were first noted several years ago. Intermittent. Pain is sharp, confined to the right upper quadrant, without radiation.  Most recent episode longer and severe enough o warrant ED visit.  Associated with nausea, exacerbated by nothing specific, woke her up at night.      Past Medical History:  has a past medical history of Allergy (1975), Arthritis, B12 deficiency (10/10/2017), Cardiomyopathy (CMS-HCC) (10/10/2017), Cerebellar infarction (CMS-HCC) (11/12/2017), Change in bowel habits (07/02/2019), CHF (congestive heart failure) (CMS-HCC), Chickenpox, Chronic a-fib (CMS-HCC) (10/10/2017), Chronic migraine without aura without status migrainosus, not intractable (10/10/2017), Essential hypertension (10/10/2017), History of stroke, Hyperlipemia, mixed (10/10/2017), Obstructive sleep apnea (10/10/2017), and Sleep apnea.   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   Family History: family history includes ALS in her maternal grandmother; Alzheimer's disease in her maternal grandmother; Cancer in her maternal grandmother; Coronary Artery Disease (Blocked arteries around heart) in her father; Deep vein thrombosis (DVT or abnormal blood clot formation) in her paternal  grandmother; Dementia in her maternal grandmother; Diabetes in her father, mother, and paternal grandmother; Diabetes type II in her maternal grandmother; High blood pressure (Hypertension) in her father; Hyperlipidemia (Elevated cholesterol) in her father and paternal grandmother; Migraines in her brother, daughter, father, maternal grandmother, mother, and sister; Myocardial Infarction (Heart attack) in her paternal grandmother; Osteoporosis (Thinning of bones) in her maternal grandmother; Stroke in her maternal grandfather and paternal grandmother.   Social History:  reports that she has never smoked. She has never used smokeless tobacco. She reports that she does not currently use alcohol. She reports that she does not use drugs.   Current Medications: has a current medication list which includes the following prescription(s): acetaminophen, atorvastatin, biotin, calcium carb/magnesium hydrox, calcium, clopidogrel, collagen, cyanocobalamin, diphenhydramine, estradiol, ajovy autoinjector, gabapentin, linzess, magnesium oxide, metoprolol tartrate, multivitamin, nurtec odt, nystatin, vitamin a, ergocalciferol (vitamin d2), and metronidazole.   Allergies:       Allergies as of 07/17/2022 - Reviewed 07/17/2022  Allergen Reaction Noted   Adhesive Rash 08/16/2020   Wasp venom Shortness Of Breath and Swelling 01/16/2017   Amoxicillin Other (See Comments) 08/21/2018   Oxycodone Itching 08/16/2020   Latex Hives and Rash 01/16/2017      ROS:  A 15 point review of systems was performed and pertinent positives and negatives noted in HPI    Objective:      BP 105/63   Pulse 61   Ht 172.7 cm ('5\' 8"'$ )   Wt 82.6 kg (182 lb)   BMI 27.67 kg/m      Constitutional :  No distress, cooperative, alert  Lymphatics/Throat:  Supple with no lymphadenopathy  Respiratory:  Clear to auscultation bilaterally  Cardiovascular:  Regular rate and rhythm  Gastrointestinal: Soft, non-tender, non-distended, no  organomegaly.  Musculoskeletal: Steady gait and movement  Skin: Cool and moist, lap surgical scars  Psychiatric: Normal affect, non-agitated, not confused  LABS:  N/a    RADS: CLINICAL DATA:  51 year old female with nausea and right upper quadrant abdominal pain; PE suspected   EXAM: CT ANGIOGRAPHY CHEST   CT ABDOMEN AND PELVIS WITH CONTRAST   TECHNIQUE: Multidetector CT imaging of the chest was performed using the standard protocol during bolus administration of intravenous contrast. Multiplanar CT image reconstructions and MIPs were obtained to evaluate the vascular anatomy. Multidetector CT imaging of the abdomen and pelvis was performed using the standard protocol during bolus administration of intravenous contrast.   RADIATION DOSE REDUCTION: This exam was performed according to the departmental dose-optimization program which includes automated exposure control, adjustment of the mA and/or kV according to patient size and/or use of iterative reconstruction technique.   CONTRAST:  172m OMNIPAQUE IOHEXOL 350 MG/ML SOLN   COMPARISON:  Abdominal ultrasound 07/03/2022 and CT abdomen and pelvis 12/15/2021   CT chest angiogram 10/17/2017   FINDINGS: CTA CHEST FINDINGS   Cardiovascular: Satisfactory opacification of the pulmonary arteries to the segmental level. No evidence of pulmonary embolism. Mild cardiomegaly. No pericardial effusion. Right chest wall pacemaker. Left atrial appendage occlusion device.   Mediastinum/Nodes: No enlarged mediastinal, hilar, or axillary lymph nodes. Nodular left thyroid lobe previously evaluated with ultrasound. Unremarkable esophagus.   Lungs/Pleura: Lungs are clear. No pleural effusion or pneumothorax.   Upper Abdomen: No acute abnormality.   Musculoskeletal: Skin thickening and subareolar soft tissue mass in the left breast.   Review of the MIP images confirms the above findings.   CT ABDOMEN and PELVIS FINDINGS    Hepatobiliary: No focal liver abnormality is seen. No gallstones, gallbladder wall thickening, or biliary dilatation.   Pancreas: Unremarkable. No pancreatic ductal dilatation or surrounding inflammatory changes.   Spleen: Normal in size without focal abnormality.   Adrenals/Urinary Tract: Adrenal glands are unremarkable. Low-attenuation lesions in the kidneys are statistically likely to represent cysts. No follow-up is required. No urinary calculi or hydronephrosis. Bladder is unremarkable.   Stomach/Bowel: Postoperative changes of Roux-en-Y gastric bypass. Mild thickening of a few loops of jejunum in the left upper quadrant may be due to decompression or enteritis. No significant adjacent stranding. Normal caliber large and small bowel. There are a few colonic diverticula without diverticulitis. Unchanged mild wall thickening about the cecum. Normal appendix.   Vascular/Lymphatic: Mild aortic atherosclerotic calcification. Decreased prominence of ileo colic lymph nodes in the right lower quadrant.   Reproductive: Status post hysterectomy. No adnexal masses.   Other: No free intraperitoneal air. Trace free fluid in the pelvis is likely physiologic.   Musculoskeletal: No acute osseous abnormality.   Review of the MIP images confirms the above findings.   IMPRESSION: CHEST:   1. Negative for acute pulmonary embolism. 2. Mild cardiomegaly. 3. Skin thickening and subareolar mass in the left breast. Recommend further evaluation with mammography.   ABDOMEN/PELVIS:   1. Mild wall thickening in the jejunum may be due to decompression or mild enteritis. 2. Otherwise no acute abnormality in the abdomen or pelvis. 3.  Aortic Atherosclerosis (ICD10-I70.0).     Electronically Signed   By: TPlacido SouM.D.   On: 07/04/2022 02:43   CLINICAL DATA:  Right upper quadrant abdominal pain.   EXAM: ULTRASOUND ABDOMEN LIMITED RIGHT UPPER QUADRANT   COMPARISON:  None  Available.   FINDINGS: Gallbladder:   Mild gallbladder distension but no gallstones, wall thickening or pericholecystic fluid. No sonographic Murphy sign.   Common bile duct:   Diameter: 2.4 mm   Liver:   Normal echogenicity without  focal lesion or biliary dilatation. Portal vein is patent on color Doppler imaging with normal direction of blood flow towards the liver.   Other: None.   IMPRESSION: 1. Mild gallbladder distension but no gallstones or findings for acute cholecystitis. 2. Normal caliber common bile duct. 3. Normal liver.     Electronically Signed   By: Marijo Sanes M.D.   On: 07/03/2022 21:41     Assessment:       Biliary colic [D35.70], classic presentation even with absence of visible stones in a dilated gallbladder, so will proceed with lap chole to prevent recurrence.   Plan:      1. Biliary colic [V77.93] Discussed the risk of surgery including post-op infxn, seroma, biloma, chronic pain, poor-delayed wound healing, retained gallstone, conversion to open procedure, post-op SBO or ileus, and need for additional procedures to address said risks.  The risks of general anesthetic including MI, CVA, sudden death or even reaction to anesthetic medications also discussed. Alternatives include continued observation.  Benefits include possible symptom relief, prevention of complications including acute cholecystitis, pancreatitis.   Typical post operative recovery of 3-5 days rest, continued pain in area and incision sites, possible loose stools up to 4-6 weeks, also discussed.   ED return precautions given for sudden increase in RUQ pain, with possible accompanying fever, nausea, and/or vomiting.   The patient understands the risks, any and all questions were answered to the patient's satisfaction.   2. Patient has elected to proceed with surgical treatment. Procedure will be scheduled.  Written consent was obtained.robotic assisted laparoscopic.   Of  note, pt also pending workup for incidental breast mass on CT.  Scheduled for mammogram, will f/u if anything concerning.   labs/images/medications/previous chart entries reviewed personally and relevant changes/updates noted above.

## 2022-09-06 NOTE — H&P (View-Only) (Signed)
ubjective:    CC: Biliary colic [X83.38]   HPI:  Michelle Norris is a 51 y.o. female who was referred by Karma Greaser for evaluation of above CC. Symptoms were first noted several years ago. Intermittent. Pain is sharp, confined to the right upper quadrant, without radiation.  Most recent episode longer and severe enough o warrant ED visit.  Associated with nausea, exacerbated by nothing specific, woke her up at night.      Past Medical History:  has a past medical history of Allergy (1975), Arthritis, B12 deficiency (10/10/2017), Cardiomyopathy (CMS-HCC) (10/10/2017), Cerebellar infarction (CMS-HCC) (11/12/2017), Change in bowel habits (07/02/2019), CHF (congestive heart failure) (CMS-HCC), Chickenpox, Chronic a-fib (CMS-HCC) (10/10/2017), Chronic migraine without aura without status migrainosus, not intractable (10/10/2017), Essential hypertension (10/10/2017), History of stroke, Hyperlipemia, mixed (10/10/2017), Obstructive sleep apnea (10/10/2017), and Sleep apnea.   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   Family History: family history includes ALS in her maternal grandmother; Alzheimer's disease in her maternal grandmother; Cancer in her maternal grandmother; Coronary Artery Disease (Blocked arteries around heart) in her father; Deep vein thrombosis (DVT or abnormal blood clot formation) in her paternal  grandmother; Dementia in her maternal grandmother; Diabetes in her father, mother, and paternal grandmother; Diabetes type II in her maternal grandmother; High blood pressure (Hypertension) in her father; Hyperlipidemia (Elevated cholesterol) in her father and paternal grandmother; Migraines in her brother, daughter, father, maternal grandmother, mother, and sister; Myocardial Infarction (Heart attack) in her paternal grandmother; Osteoporosis (Thinning of bones) in her maternal grandmother; Stroke in her maternal grandfather and paternal grandmother.   Social History:  reports that she has never smoked. She has never used smokeless tobacco. She reports that she does not currently use alcohol. She reports that she does not use drugs.   Current Medications: has a current medication list which includes the following prescription(s): acetaminophen, atorvastatin, biotin, calcium carb/magnesium hydrox, calcium, clopidogrel, collagen, cyanocobalamin, diphenhydramine, estradiol, ajovy autoinjector, gabapentin, linzess, magnesium oxide, metoprolol tartrate, multivitamin, nurtec odt, nystatin, vitamin a, ergocalciferol (vitamin d2), and metronidazole.   Allergies:       Allergies as of 07/17/2022 - Reviewed 07/17/2022  Allergen Reaction Noted   Adhesive Rash 08/16/2020   Wasp venom Shortness Of Breath and Swelling 01/16/2017   Amoxicillin Other (See Comments) 08/21/2018   Oxycodone Itching 08/16/2020   Latex Hives and Rash 01/16/2017      ROS:  A 15 point review of systems was performed and pertinent positives and negatives noted in HPI    Objective:      BP 105/63   Pulse 61   Ht 172.7 cm ('5\' 8"'$ )   Wt 82.6 kg (182 lb)   BMI 27.67 kg/m      Constitutional :  No distress, cooperative, alert  Lymphatics/Throat:  Supple with no lymphadenopathy  Respiratory:  Clear to auscultation bilaterally  Cardiovascular:  Regular rate and rhythm  Gastrointestinal: Soft, non-tender, non-distended, no  organomegaly.  Musculoskeletal: Steady gait and movement  Skin: Cool and moist, lap surgical scars  Psychiatric: Normal affect, non-agitated, not confused  LABS:  N/a    RADS: CLINICAL DATA:  51 year old female with nausea and right upper quadrant abdominal pain; PE suspected   EXAM: CT ANGIOGRAPHY CHEST   CT ABDOMEN AND PELVIS WITH CONTRAST   TECHNIQUE: Multidetector CT imaging of the chest was performed using the standard protocol during bolus administration of intravenous contrast. Multiplanar CT image reconstructions and MIPs were obtained to evaluate the vascular anatomy. Multidetector CT imaging of the abdomen and pelvis was performed using the standard protocol during bolus administration of intravenous contrast.   RADIATION DOSE REDUCTION: This exam was performed according to the departmental dose-optimization program which includes automated exposure control, adjustment of the mA and/or kV according to patient size and/or use of iterative reconstruction technique.   CONTRAST:  150m OMNIPAQUE IOHEXOL 350 MG/ML SOLN   COMPARISON:  Abdominal ultrasound 07/03/2022 and CT abdomen and pelvis 12/15/2021   CT chest angiogram 10/17/2017   FINDINGS: CTA CHEST FINDINGS   Cardiovascular: Satisfactory opacification of the pulmonary arteries to the segmental level. No evidence of pulmonary embolism. Mild cardiomegaly. No pericardial effusion. Right chest wall pacemaker. Left atrial appendage occlusion device.   Mediastinum/Nodes: No enlarged mediastinal, hilar, or axillary lymph nodes. Nodular left thyroid lobe previously evaluated with ultrasound. Unremarkable esophagus.   Lungs/Pleura: Lungs are clear. No pleural effusion or pneumothorax.   Upper Abdomen: No acute abnormality.   Musculoskeletal: Skin thickening and subareolar soft tissue mass in the left breast.   Review of the MIP images confirms the above findings.   CT ABDOMEN and PELVIS FINDINGS    Hepatobiliary: No focal liver abnormality is seen. No gallstones, gallbladder wall thickening, or biliary dilatation.   Pancreas: Unremarkable. No pancreatic ductal dilatation or surrounding inflammatory changes.   Spleen: Normal in size without focal abnormality.   Adrenals/Urinary Tract: Adrenal glands are unremarkable. Low-attenuation lesions in the kidneys are statistically likely to represent cysts. No follow-up is required. No urinary calculi or hydronephrosis. Bladder is unremarkable.   Stomach/Bowel: Postoperative changes of Roux-en-Y gastric bypass. Mild thickening of a few loops of jejunum in the left upper quadrant may be due to decompression or enteritis. No significant adjacent stranding. Normal caliber large and small bowel. There are a few colonic diverticula without diverticulitis. Unchanged mild wall thickening about the cecum. Normal appendix.   Vascular/Lymphatic: Mild aortic atherosclerotic calcification. Decreased prominence of ileo colic lymph nodes in the right lower quadrant.   Reproductive: Status post hysterectomy. No adnexal masses.   Other: No free intraperitoneal air. Trace free fluid in the pelvis is likely physiologic.   Musculoskeletal: No acute osseous abnormality.   Review of the MIP images confirms the above findings.   IMPRESSION: CHEST:   1. Negative for acute pulmonary embolism. 2. Mild cardiomegaly. 3. Skin thickening and subareolar mass in the left breast. Recommend further evaluation with mammography.   ABDOMEN/PELVIS:   1. Mild wall thickening in the jejunum may be due to decompression or mild enteritis. 2. Otherwise no acute abnormality in the abdomen or pelvis. 3.  Aortic Atherosclerosis (ICD10-I70.0).     Electronically Signed   By: TPlacido SouM.D.   On: 07/04/2022 02:43   CLINICAL DATA:  Right upper quadrant abdominal pain.   EXAM: ULTRASOUND ABDOMEN LIMITED RIGHT UPPER QUADRANT   COMPARISON:  None  Available.   FINDINGS: Gallbladder:   Mild gallbladder distension but no gallstones, wall thickening or pericholecystic fluid. No sonographic Murphy sign.   Common bile duct:   Diameter: 2.4 mm   Liver:   Normal echogenicity without  focal lesion or biliary dilatation. Portal vein is patent on color Doppler imaging with normal direction of blood flow towards the liver.   Other: None.   IMPRESSION: 1. Mild gallbladder distension but no gallstones or findings for acute cholecystitis. 2. Normal caliber common bile duct. 3. Normal liver.     Electronically Signed   By: Marijo Sanes M.D.   On: 07/03/2022 21:41     Assessment:       Biliary colic [H41.74], classic presentation even with absence of visible stones in a dilated gallbladder, so will proceed with lap chole to prevent recurrence.   Plan:      1. Biliary colic [Y81.44] Discussed the risk of surgery including post-op infxn, seroma, biloma, chronic pain, poor-delayed wound healing, retained gallstone, conversion to open procedure, post-op SBO or ileus, and need for additional procedures to address said risks.  The risks of general anesthetic including MI, CVA, sudden death or even reaction to anesthetic medications also discussed. Alternatives include continued observation.  Benefits include possible symptom relief, prevention of complications including acute cholecystitis, pancreatitis.   Typical post operative recovery of 3-5 days rest, continued pain in area and incision sites, possible loose stools up to 4-6 weeks, also discussed.   ED return precautions given for sudden increase in RUQ pain, with possible accompanying fever, nausea, and/or vomiting.   The patient understands the risks, any and all questions were answered to the patient's satisfaction.   2. Patient has elected to proceed with surgical treatment. Procedure will be scheduled.  Written consent was obtained.robotic assisted laparoscopic.   Of  note, pt also pending workup for incidental breast mass on CT.  Scheduled for mammogram, will f/u if anything concerning.   labs/images/medications/previous chart entries reviewed personally and relevant changes/updates noted above.

## 2022-09-07 ENCOUNTER — Other Ambulatory Visit: Payer: Self-pay

## 2022-09-07 ENCOUNTER — Ambulatory Visit: Payer: No Typology Code available for payment source | Admitting: Urgent Care

## 2022-09-07 ENCOUNTER — Encounter
Admission: RE | Disposition: A | Discharge status: Home / Self Care | Payer: Self-pay | Source: Home / Self Care | Attending: Surgery

## 2022-09-07 ENCOUNTER — Ambulatory Visit
Admission: RE | Admit: 2022-09-07 | Discharge: 2022-09-07 | Disposition: A | Discharge status: Home / Self Care | Payer: No Typology Code available for payment source | Attending: Surgery | Admitting: Surgery

## 2022-09-07 ENCOUNTER — Encounter: Payer: Self-pay | Admitting: Surgery

## 2022-09-07 DIAGNOSIS — K8066 Calculus of gallbladder and bile duct with acute and chronic cholecystitis without obstruction: Secondary | ICD-10-CM | POA: Insufficient documentation

## 2022-09-07 DIAGNOSIS — K8050 Calculus of bile duct without cholangitis or cholecystitis without obstruction: Secondary | ICD-10-CM

## 2022-09-07 DIAGNOSIS — Z90710 Acquired absence of both cervix and uterus: Secondary | ICD-10-CM | POA: Diagnosis not present

## 2022-09-07 DIAGNOSIS — Z9884 Bariatric surgery status: Secondary | ICD-10-CM | POA: Diagnosis not present

## 2022-09-07 DIAGNOSIS — Z8673 Personal history of transient ischemic attack (TIA), and cerebral infarction without residual deficits: Secondary | ICD-10-CM | POA: Insufficient documentation

## 2022-09-07 DIAGNOSIS — G4730 Sleep apnea, unspecified: Secondary | ICD-10-CM | POA: Insufficient documentation

## 2022-09-07 DIAGNOSIS — I509 Heart failure, unspecified: Secondary | ICD-10-CM | POA: Insufficient documentation

## 2022-09-07 DIAGNOSIS — I2510 Atherosclerotic heart disease of native coronary artery without angina pectoris: Secondary | ICD-10-CM | POA: Insufficient documentation

## 2022-09-07 DIAGNOSIS — I4891 Unspecified atrial fibrillation: Secondary | ICD-10-CM | POA: Diagnosis not present

## 2022-09-07 DIAGNOSIS — I110 Hypertensive heart disease with heart failure: Secondary | ICD-10-CM | POA: Insufficient documentation

## 2022-09-07 DIAGNOSIS — I428 Other cardiomyopathies: Secondary | ICD-10-CM | POA: Insufficient documentation

## 2022-09-07 DIAGNOSIS — Z419 Encounter for procedure for purposes other than remedying health state, unspecified: Secondary | ICD-10-CM

## 2022-09-07 HISTORY — DX: Other cardiomyopathies: I42.8

## 2022-09-07 HISTORY — DX: Atherosclerosis of aorta: I70.0

## 2022-09-07 HISTORY — DX: Atherosclerotic heart disease of native coronary artery without angina pectoris: I25.10

## 2022-09-07 HISTORY — DX: Long term (current) use of antithrombotics/antiplatelets: Z79.02

## 2022-09-07 HISTORY — DX: Obstructive sleep apnea (adult) (pediatric): G47.33

## 2022-09-07 HISTORY — PX: ROBOTIC ASSISTED LAPAROSCOPIC CHOLECYSTECTOMY: SHX6521

## 2022-09-07 HISTORY — DX: Bradycardia, unspecified: R00.1

## 2022-09-07 HISTORY — DX: Unspecified osteoarthritis, unspecified site: M19.90

## 2022-09-07 HISTORY — DX: Edema, unspecified: R60.9

## 2022-09-07 HISTORY — DX: Localized edema: R60.0

## 2022-09-07 HISTORY — DX: Migraine, unspecified, not intractable, without status migrainosus: G43.909

## 2022-09-07 HISTORY — DX: Bariatric surgery status: Z98.84

## 2022-09-07 SURGERY — CHOLECYSTECTOMY, ROBOT-ASSISTED, LAPAROSCOPIC
Anesthesia: General | Site: Abdomen

## 2022-09-07 MED ORDER — SUGAMMADEX SODIUM 200 MG/2ML IV SOLN
INTRAVENOUS | Status: DC | PRN
  Administered 2022-09-07: 200 mg via INTRAVENOUS

## 2022-09-07 MED ORDER — SUCCINYLCHOLINE CHLORIDE 200 MG/10ML IV SOSY
PREFILLED_SYRINGE | INTRAVENOUS | Status: DC | PRN
  Administered 2022-09-07: 120 mg via INTRAVENOUS

## 2022-09-07 MED ORDER — FENTANYL CITRATE (PF) 100 MCG/2ML IJ SOLN
25.0000 ug | INTRAMUSCULAR | Status: DC | PRN
  Administered 2022-09-07 (×3): 25 ug via INTRAVENOUS

## 2022-09-07 MED ORDER — FENTANYL CITRATE (PF) 100 MCG/2ML IJ SOLN
INTRAMUSCULAR | Status: AC
  Administered 2022-09-07: 25 ug via INTRAVENOUS
  Filled 2022-09-07: qty 2

## 2022-09-07 MED ORDER — PHENYLEPHRINE 80 MCG/ML (10ML) SYRINGE FOR IV PUSH (FOR BLOOD PRESSURE SUPPORT)
PREFILLED_SYRINGE | INTRAVENOUS | Status: AC
  Filled 2022-09-07: qty 10

## 2022-09-07 MED ORDER — CEFAZOLIN SODIUM-DEXTROSE 2-4 GM/100ML-% IV SOLN
INTRAVENOUS | Status: AC
  Filled 2022-09-07: qty 100

## 2022-09-07 MED ORDER — LIDOCAINE-EPINEPHRINE (PF) 1 %-1:200000 IJ SOLN
INTRAMUSCULAR | Status: AC
  Filled 2022-09-07: qty 30

## 2022-09-07 MED ORDER — PROPOFOL 10 MG/ML IV BOLUS
INTRAVENOUS | Status: DC | PRN
  Administered 2022-09-07: 150 mg via INTRAVENOUS

## 2022-09-07 MED ORDER — PHENYLEPHRINE HCL (PRESSORS) 10 MG/ML IV SOLN
INTRAVENOUS | Status: AC
  Filled 2022-09-07: qty 1

## 2022-09-07 MED ORDER — DEXAMETHASONE SODIUM PHOSPHATE 10 MG/ML IJ SOLN
INTRAMUSCULAR | Status: DC | PRN
  Administered 2022-09-07: 5 mg via INTRAVENOUS

## 2022-09-07 MED ORDER — HYDROCODONE-ACETAMINOPHEN 5-325 MG PO TABS: 1.0000 | ORAL_TABLET | Freq: Four times a day (QID) | ORAL | 0 refills | Status: DC | PRN

## 2022-09-07 MED ORDER — LIDOCAINE HCL (PF) 2 % IJ SOLN
INTRAMUSCULAR | Status: AC
  Filled 2022-09-07: qty 5

## 2022-09-07 MED ORDER — MIDAZOLAM HCL 2 MG/2ML IJ SOLN
INTRAMUSCULAR | Status: DC | PRN
  Administered 2022-09-07: 2 mg via INTRAVENOUS

## 2022-09-07 MED ORDER — PHENYLEPHRINE HCL-NACL 20-0.9 MG/250ML-% IV SOLN
INTRAVENOUS | Status: DC | PRN
  Administered 2022-09-07: 50 ug/min via INTRAVENOUS

## 2022-09-07 MED ORDER — ONDANSETRON HCL 4 MG/2ML IJ SOLN
INTRAMUSCULAR | Status: AC
  Filled 2022-09-07: qty 2

## 2022-09-07 MED ORDER — BUPIVACAINE HCL (PF) 0.5 % IJ SOLN
INTRAMUSCULAR | Status: AC
  Filled 2022-09-07: qty 30

## 2022-09-07 MED ORDER — KETAMINE HCL 10 MG/ML IJ SOLN
INTRAMUSCULAR | Status: DC | PRN
  Administered 2022-09-07: 20 mg via INTRAVENOUS
  Administered 2022-09-07: 30 mg via INTRAVENOUS

## 2022-09-07 MED ORDER — GLYCOPYRROLATE 0.2 MG/ML IJ SOLN
INTRAMUSCULAR | Status: AC
  Filled 2022-09-07: qty 1

## 2022-09-07 MED ORDER — PROPOFOL 10 MG/ML IV BOLUS
INTRAVENOUS | Status: AC
  Filled 2022-09-07: qty 40

## 2022-09-07 MED ORDER — ACETAMINOPHEN 10 MG/ML IV SOLN
INTRAVENOUS | Status: AC
  Filled 2022-09-07: qty 100

## 2022-09-07 MED ORDER — PHENYLEPHRINE HCL (PRESSORS) 10 MG/ML IV SOLN
INTRAVENOUS | Status: DC | PRN
  Administered 2022-09-07: 150 ug via INTRAVENOUS

## 2022-09-07 MED ORDER — FENTANYL CITRATE (PF) 100 MCG/2ML IJ SOLN
INTRAMUSCULAR | Status: AC
  Filled 2022-09-07: qty 2

## 2022-09-07 MED ORDER — INDOCYANINE GREEN 25 MG IV SOLR
1.2500 mg | Freq: Once | INTRAVENOUS | Status: AC
  Administered 2022-09-07: 1.25 mg via INTRAVENOUS
  Filled 2022-09-07: qty 0.5

## 2022-09-07 MED ORDER — ONDANSETRON HCL 4 MG/2ML IJ SOLN
INTRAMUSCULAR | Status: DC | PRN
  Administered 2022-09-07: 4 mg via INTRAVENOUS

## 2022-09-07 MED ORDER — LIDOCAINE HCL (CARDIAC) PF 100 MG/5ML IV SOSY
PREFILLED_SYRINGE | INTRAVENOUS | Status: DC | PRN
  Administered 2022-09-07: 100 mg via INTRAVENOUS

## 2022-09-07 MED ORDER — CHLORHEXIDINE GLUCONATE 0.12 % MT SOLN
OROMUCOSAL | Status: AC
  Administered 2022-09-07: 15 mL via OROMUCOSAL
  Filled 2022-09-07: qty 15

## 2022-09-07 MED ORDER — CEFAZOLIN SODIUM-DEXTROSE 2-4 GM/100ML-% IV SOLN
2.0000 g | INTRAVENOUS | Status: AC
  Administered 2022-09-07: 2 g via INTRAVENOUS

## 2022-09-07 MED ORDER — OXYCODONE HCL 5 MG PO TABS
ORAL_TABLET | ORAL | Status: AC
  Administered 2022-09-07: 5 mg via ORAL
  Filled 2022-09-07: qty 1

## 2022-09-07 MED ORDER — OXYCODONE HCL 5 MG PO TABS: 5.0000 mg | ORAL_TABLET | Freq: Once | ORAL | Status: AC | PRN

## 2022-09-07 MED ORDER — GLYCOPYRROLATE 0.2 MG/ML IJ SOLN
INTRAMUSCULAR | Status: DC | PRN
  Administered 2022-09-07: .2 mg via INTRAVENOUS

## 2022-09-07 MED ORDER — FENTANYL CITRATE (PF) 100 MCG/2ML IJ SOLN
INTRAMUSCULAR | Status: DC | PRN
  Administered 2022-09-07 (×2): 50 ug via INTRAVENOUS

## 2022-09-07 MED ORDER — KETAMINE HCL 50 MG/5ML IJ SOSY
PREFILLED_SYRINGE | INTRAMUSCULAR | Status: AC
  Filled 2022-09-07: qty 5

## 2022-09-07 MED ORDER — ROCURONIUM BROMIDE 100 MG/10ML IV SOLN
INTRAVENOUS | Status: DC | PRN
  Administered 2022-09-07: 40 mg via INTRAVENOUS
  Administered 2022-09-07: 20 mg via INTRAVENOUS

## 2022-09-07 MED ORDER — ACETAMINOPHEN 325 MG PO TABS: 650.0000 mg | ORAL_TABLET | Freq: Three times a day (TID) | ORAL | 0 refills | Status: AC | PRN

## 2022-09-07 MED ORDER — CHLORHEXIDINE GLUCONATE 0.12 % MT SOLN: 15.0000 mL | Freq: Once | OROMUCOSAL | Status: AC

## 2022-09-07 MED ORDER — ORAL CARE MOUTH RINSE: 15.0000 mL | Freq: Once | OROMUCOSAL | Status: AC

## 2022-09-07 MED ORDER — CHLORHEXIDINE GLUCONATE CLOTH 2 % EX PADS
6.0000 | MEDICATED_PAD | Freq: Once | CUTANEOUS | Status: AC
  Administered 2022-09-07: 6 via TOPICAL

## 2022-09-07 MED ORDER — LACTATED RINGERS IV SOLN: INTRAVENOUS | Status: DC

## 2022-09-07 MED ORDER — OXYCODONE HCL 5 MG/5ML PO SOLN: 5.0000 mg | Freq: Once | ORAL | Status: AC | PRN

## 2022-09-07 MED ORDER — EPHEDRINE 5 MG/ML INJ
INTRAVENOUS | Status: AC
  Filled 2022-09-07: qty 5

## 2022-09-07 MED ORDER — LIDOCAINE-EPINEPHRINE (PF) 1 %-1:200000 IJ SOLN
INTRAMUSCULAR | Status: DC | PRN
  Administered 2022-09-07: 25 mL via INTRAMUSCULAR

## 2022-09-07 MED ORDER — ROCURONIUM BROMIDE 10 MG/ML (PF) SYRINGE
PREFILLED_SYRINGE | INTRAVENOUS | Status: AC
  Filled 2022-09-07: qty 10

## 2022-09-07 MED ORDER — MIDAZOLAM HCL 2 MG/2ML IJ SOLN
INTRAMUSCULAR | Status: AC
  Filled 2022-09-07: qty 2

## 2022-09-07 MED ORDER — SUCCINYLCHOLINE CHLORIDE 200 MG/10ML IV SOSY
PREFILLED_SYRINGE | INTRAVENOUS | Status: AC
  Filled 2022-09-07: qty 10

## 2022-09-07 MED ORDER — EPHEDRINE SULFATE (PRESSORS) 50 MG/ML IJ SOLN
INTRAMUSCULAR | Status: DC | PRN
  Administered 2022-09-07: 5 mg via INTRAVENOUS

## 2022-09-07 MED ORDER — ACETAMINOPHEN 10 MG/ML IV SOLN
1000.0000 mg | Freq: Once | INTRAVENOUS | Status: AC
  Administered 2022-09-07: 1000 mg via INTRAVENOUS

## 2022-09-07 MED ORDER — PHENYLEPHRINE 80 MCG/ML (10ML) SYRINGE FOR IV PUSH (FOR BLOOD PRESSURE SUPPORT)
PREFILLED_SYRINGE | INTRAVENOUS | Status: DC | PRN
  Administered 2022-09-07 (×3): 160 ug via INTRAVENOUS

## 2022-09-07 SURGICAL SUPPLY — 53 items
ANCHOR TIS RET SYS 235ML (MISCELLANEOUS) ×2 IMPLANT
BAG PRESSURE INF REUSE 1000 (BAG) IMPLANT
BLADE SURG SZ11 CARB STEEL (BLADE) ×2 IMPLANT
CANNULA REDUC XI 12-8 STAPL (CANNULA) ×2
CANNULA REDUCER 12-8 DVNC XI (CANNULA) ×2 IMPLANT
CATH REDDICK CHOLANGI 4FR 50CM (CATHETERS) IMPLANT
CLIP LIGATING HEMO O LOK GREEN (MISCELLANEOUS) ×2 IMPLANT
DERMABOND ADVANCED .7 DNX12 (GAUZE/BANDAGES/DRESSINGS) ×2 IMPLANT
DRAPE ARM DVNC X/XI (DISPOSABLE) ×8 IMPLANT
DRAPE C-ARM XRAY 36X54 (DRAPES) IMPLANT
DRAPE COLUMN DVNC XI (DISPOSABLE) ×2 IMPLANT
DRAPE DA VINCI XI ARM (DISPOSABLE) ×8
DRAPE DA VINCI XI COLUMN (DISPOSABLE) ×2
ELECT CAUTERY BLADE 6.4 (BLADE) ×2 IMPLANT
ELECT REM PT RETURN 9FT ADLT (ELECTROSURGICAL) ×2
ELECTRODE REM PT RTRN 9FT ADLT (ELECTROSURGICAL) ×2 IMPLANT
GLOVE BIOGEL PI IND STRL 7.0 (GLOVE) ×4 IMPLANT
GLOVE SURG SYN 6.5 ES PF (GLOVE) ×8 IMPLANT
GLOVE SURG SYN 6.5 PF PI (GLOVE) ×4 IMPLANT
GOWN STRL REUS W/ TWL LRG LVL3 (GOWN DISPOSABLE) ×6 IMPLANT
GOWN STRL REUS W/TWL LRG LVL3 (GOWN DISPOSABLE) ×6
GRASPER SUT TROCAR 14GX15 (MISCELLANEOUS) IMPLANT
IRRIGATOR SUCT 8 DISP DVNC XI (IRRIGATION / IRRIGATOR) IMPLANT
IRRIGATOR SUCTION 8MM XI DISP (IRRIGATION / IRRIGATOR)
IV NS 1000ML (IV SOLUTION)
IV NS 1000ML BAXH (IV SOLUTION) IMPLANT
LABEL OR SOLS (LABEL) ×2 IMPLANT
MANIFOLD NEPTUNE II (INSTRUMENTS) ×2 IMPLANT
NDL INSUFFLATION 14GA 120MM (NEEDLE) ×2 IMPLANT
NEEDLE HYPO 22GX1.5 SAFETY (NEEDLE) ×2 IMPLANT
NEEDLE INSUFFLATION 14GA 120MM (NEEDLE) ×2 IMPLANT
NS IRRIG 500ML POUR BTL (IV SOLUTION) ×2 IMPLANT
OBTURATOR OPTICAL STANDARD 8MM (TROCAR) ×2
OBTURATOR OPTICAL STND 8 DVNC (TROCAR) ×2
OBTURATOR OPTICALSTD 8 DVNC (TROCAR) ×2 IMPLANT
PACK LAP CHOLECYSTECTOMY (MISCELLANEOUS) ×2 IMPLANT
PENCIL SMOKE EVACUATOR (MISCELLANEOUS) ×2 IMPLANT
SEAL CANN UNIV 5-8 DVNC XI (MISCELLANEOUS) ×6 IMPLANT
SEAL XI 5MM-8MM UNIVERSAL (MISCELLANEOUS) ×6
SET TUBE SMOKE EVAC HIGH FLOW (TUBING) ×2 IMPLANT
SOLUTION ELECTROLUBE (MISCELLANEOUS) ×2 IMPLANT
SPIKE FLUID TRANSFER (MISCELLANEOUS) ×4 IMPLANT
SPONGE T-LAP 4X18 ~~LOC~~+RFID (SPONGE) IMPLANT
STAPLER CANNULA SEAL DVNC XI (STAPLE) ×2 IMPLANT
STAPLER CANNULA SEAL XI (STAPLE) ×2
SUT MNCRL 4-0 (SUTURE) ×4
SUT MNCRL 4-0 27XMFL (SUTURE) ×4
SUT VICRYL 0 UR6 27IN ABS (SUTURE) ×2 IMPLANT
SUTURE MNCRL 4-0 27XMF (SUTURE) ×4 IMPLANT
SYR 30ML LL (SYRINGE) IMPLANT
SYSTEM WECK SHIELD CLOSURE (TROCAR) IMPLANT
TRAP FLUID SMOKE EVACUATOR (MISCELLANEOUS) ×2 IMPLANT
WATER STERILE IRR 500ML POUR (IV SOLUTION) ×2 IMPLANT

## 2022-09-07 NOTE — Op Note (Signed)
Preoperative diagnosis:  biliary colic  Postoperative diagnosis: same as above  Procedure: Robotic assisted Laparoscopic Cholecystectomy.   Anesthesia: GETA   Surgeon: Benjamine Sprague  Specimen: Gallbladder  Complications: None  EBL: 68m  Wound Classification: Clean Contaminated  Indications: see HPI  Findings: Critical view of safety noted Cystic duct and artery identified, ligated and divided, clips remained intact at end of procedure Adequate hemostasis  Description of procedure:  The patient was placed on the operating table in the supine position. SCDs placed, pre-op abx administered.  General anesthesia was induced and OG tube placed by anesthesia. A time-out was completed verifying correct patient, procedure, site, positioning, and implant(s) and/or special equipment prior to beginning this procedure. The abdomen was prepped and draped in the usual sterile fashion.    Veress needle was placed at the Palmer's point and insufflation was started after confirming a positive saline drop test and no immediate increase in abdominal pressure.  After reaching 15 mm, the Veress needle was removed and 560mport placed through same area via optiview technique. The abdomen was inspected and no bnormalities or injuries were found. Under direct vision, ports were placed in the following locations: a 8 mm port was placedunder umbilicus measured [id number] gallbladder.    One 12 mm patient left of the umbilicus, 8cm, one 8 mm port placed to the patient right of the umbilical port 8 cm apart.  1 additional 8 mm port placed lateral to the 1221mort.  Once ports were placed, The table was placed in the reverse Trendelenburg position with the right side up. The Xi platform was brought into the operative field and docked to the ports successfully.  An endoscope was placed through the umbilical port, fenestrated grasper through the adjacent patient right port, prograsp to the far patient left port, and then a  hook cautery in the left port.  Gallbladder decompressed with needle to facilitate retraction. The dome of the gallbladder was grasped with prograsp, passed and retracted over the dome of the liver. Adhesions between the gallbladder and omentum, duodenum and transverse colon were lysed via hook cautery. The infundibulum was grasped with the fenestrated grasper and retracted toward the right lower quadrant. This maneuver exposed Calot's triangle. The peritoneum overlying the gallbladder infundibulum was then dissected  and the cystic duct and cystic artery identified.  Critical view of safety with the liver bed clearly visible behind the duct and artery with no additional structures noted.  The cystic duct and cystic artery clipped and divided close to the gallbladder.     The gallbladder was then dissected from its peritoneal and liver bed attachments by electrocautery. Hemostasis was checked prior to removing the hook cautery and the Endo Catch bag was then placed through the 12 mm port and the gallbladder was removed.  The gallbladder was passed off the table as a specimen. There was no evidence of bleeding from the gallbladder fossa or cystic artery or leakage of the bile from the cystic duct stump. The 12 mm port site closed with PMI using 0 vicryl under direct vision.  Abdomen desufflated and secondary trocars were removed under direct vision. No bleeding was noted. All skin incisions then closed with subcuticular sutures of 4-0 monocryl and dressed with topical skin adhesive. The orogastric tube was removed and patient extubated.  The patient tolerated the procedure well and was taken to the postanesthesia care unit in stable condition.  All sponge and instrument count correct at end of procedure.

## 2022-09-07 NOTE — Transfer of Care (Signed)
Immediate Anesthesia Transfer of Care Note  Patient: Michelle Norris  Procedure(s) Performed: XI ROBOTIC ASSISTED LAPAROSCOPIC CHOLECYSTECTOMY (Abdomen)  Patient Location: PACU  Anesthesia Type:General  Level of Consciousness: awake, alert , and oriented  Airway & Oxygen Therapy: Patient Spontanous Breathing and Patient connected to nasal cannula oxygen  Post-op Assessment: Report given to RN and Post -op Vital signs reviewed and stable  Post vital signs: Reviewed and stable  Last Vitals:  Vitals Value Taken Time  BP 107/46 09/07/22 1020  Temp    Pulse 71 09/07/22 1023  Resp 16 09/07/22 1023  SpO2 99 % 09/07/22 1023  Vitals shown include unvalidated device data.  Last Pain:  Vitals:   09/07/22 0812  PainSc: 0-No pain         Complications: No notable events documented.

## 2022-09-07 NOTE — Anesthesia Preprocedure Evaluation (Signed)
Anesthesia Evaluation  Patient identified by MRN, date of birth, ID band Patient awake    Reviewed: Allergy & Precautions, NPO status , Patient's Chart, lab work & pertinent test results  History of Anesthesia Complications Negative for: history of anesthetic complications  Airway Mallampati: III  TM Distance: >3 FB Neck ROM: full    Dental  (+) Chipped, Dental Advidsory Given, Poor Dentition   Pulmonary neg pulmonary ROS, sleep apnea and Oxygen sleep apnea , neg COPD   Pulmonary exam normal        Cardiovascular hypertension, On Medications and On Home Beta Blockers + CAD and +CHF  negative cardio ROS Normal cardiovascular exam+ dysrhythmias (s/p watchman) Atrial Fibrillation and Supra Ventricular Tachycardia   03/01/2022 -- Echocardiogram (CE): No significant pericardial effusion. Left to right shunt post transeptal puncture. Left ventricular ejection fraction, by estimation, is 35 to 40%. The left ventricle has normal function. Right ventricular systolic function is normal. The right ventricular size is normal. Left atrial size was mildly dilated. No left atrial/left atrial appendage thrombus was detected. Right atrial size was mildly dilated. The mitral valve is normal in structure. Trivial mitral valve regurgitation. No evidence of mitral stenosis. The aortic valve is tricuspid. Aortic valve regurgitation is not visualized. No aortic stenosis is present.   03/27/22 interrogation --  HR 43 - 169bpm, average 70 bpm. 5 nonsustained VT, longest 18 beats 4 nonsustained SVT, longest 19 beats Rare supraventricular ectopy. Occasional ventricular ectopy, 2.1%. No sustained arrhythmias. Symptom triggered episodes correspond to sinus rhythm +/- PVC's.     Neuro/Psych  Neuromuscular disease CVA (frequent falls), Residual Symptoms negative neurological ROS  negative psych ROS   GI/Hepatic negative GI ROS, Neg liver ROS,,,  Endo/Other   negative endocrine ROS    Renal/GU      Musculoskeletal   Abdominal   Peds  Hematology negative hematology ROS (+) Plavix    Anesthesia Other Findings Past Medical History: No date: Aortic atherosclerosis (HCC) No date: Atrial fibrillation ([place])     Comment:  a.) CHA2DS2VASc = 6 (sex, CHF, HTN, CVA x2, vascular               disease history);  b.) s/p DCCV (150 J x1) 12/02/2017;               c.) s/p Watchman device placement 03/01/2022; d.)               rate/rhythm maintained now without pharmacological               intervention; chronically anticoagulated with clopidogrel 08/03/2021: Atypical mole     Comment:  L sacral, moderate atypia No date: Bradycardia No date: CAD (coronary artery disease)     Comment:  a.) LHC 10/15/2017: EF 35-45%, LVEDP norm, normal               coronaries; b.) cCTA 08/10/2021: EF 67%. 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 No date: Hyperlipidemia No date: Hypertension 07/2018: Leukocytosis  Comment:  being monitored by pcp No date: Long term current use of antithrombotics/antiplatelets     Comment:  a.) clopidogrel No date: Migraines No date: NICM (nonischemic cardiomyopathy) ([place])     Comment:  a.) TTE 09/17/2017: EF 30%; b.) MPI 09/20/2017: EF               45-50%; c.) LHC 10/15/2017: EF 35-45%; d.) TTE               03/23/2020: EF 45%; e.) TTE 05/08/2021: EF 50%; f.) cCTA               08/10/2021: EF 67% 03/26/2022: NSVT (nonsustained ventricular tachycardia) ([place])     Comment:  a.) Holter 03/26/2022 - 5 runs of NSVT with the longest               lasting 18 beats No date: Obesity No date: OSA  (obstructive sleep apnea)     Comment:  a.) no nocturnal PAP therapy; has Inspire implant No date: Osteoarthritis No date: Peripheral edema 03/01/2022: Presence of Watchman left atrial appendage closure device     Comment:  a.) s/p Watchman FLX 27 mm device placement 03/26/2022: PSVT (paroxysmal supraventricular tachycardia)     Comment:  a.) Holter 03/26/2022 - 4 runs of SVT with the longest               lasting 19 beats 06/2018: [facility] spotted fever     Comment:  bulls-eye rash on back No date: Vitamin B12 deficiency  Past Surgical History: 08/25/2018: BICEPT TENODESIS; Left     Comment:  Procedure: BICEPS TENODESIS;  Surgeon: Leim Fabry, MD;              Location: [facility] ORS;  Service: Orthopedics;  Laterality:               Left; 12/02/2017: CARDIOVERSION; N/A     Comment:  Procedure: CARDIOVERSION;  Surgeon: Yolonda Kida,               MD;  Location: [facility] ORS;  Service: Cardiovascular;                Laterality: N/A; 03/01/2022: LEFT ATRIAL APPENDAGE OCCLUSION; N/A     Comment:  Procedure: LEFT ATRIAL APPENDAGE OCCLUSION;  Surgeon:               Vickie Epley, MD;  Location: [place] [facility];                Service: Cardiovascular;  Laterality: N/A; 10/15/2017: LEFT HEART CATH AND CORONARY ANGIOGRAPHY; Left     Comment:  Procedure: LEFT HEART CATH AND CORONARY ANGIOGRAPHY;                Surgeon: Yolonda Kida, MD;  Location: [place]              CV LAB;  Service: Cardiovascular;  Laterality: Left; 10/15/2017: LEFT HEART CATH AND CORONARY ANGIOGRAPHY; Left     Comment:  Procedure: LEFT HEART CATH AND CORONARY ANGIOGRAPHY;               Location: [place]; Surgeon: Katrine Coho, MD 2004: ORIF [place]; Right     Comment:  fall on right ; ANKLE FRACTURE 08/25/2018: SHOULDER ARTHROSCOPY WITH OPEN ROTATOR CUFF REPAIR; Left     Comment:  Procedure: SHOULDER ARTHROSCOPY WITH SUBCROMIAL               DECOMPRESSION  DISTAL CLAVICLE  EXCISION VS.OPEN ROTATOR               CUFF REPAIR;  Surgeon: Leim Fabry, MD;  Location: [facility]               ORS;  Service: Orthopedics;  Laterality: Left; 2013: SUPRACERVICAL ABDOMINAL HYSTERECTOMY; N/A 03/01/2022: TEE WITHOUT CARDIOVERSION; N/A     Comment:  Procedure: TRANSESOPHAGEAL ECHOCARDIOGRAM (TEE);                Surgeon: Vickie Epley, MD;  Location: [place]               CV LAB;  Service: Cardiovascular;  Laterality: N/A; No date: TEMPOROMANDIBULAR JOINT ARTHROSCOPY; Bilateral     Comment:  1994  BMI    Body Mass Index: 28.46 kg/m      Reproductive/Obstetrics negative OB ROS                             Anesthesia Physical Anesthesia Plan  ASA: 3  Anesthesia Plan: General LMA   Post-op Pain Management:    Induction: Intravenous  PONV Risk Score and Plan: 3 and Dexamethasone, Ondansetron and Midazolam  Airway Management Planned: Oral ETT  Additional Equipment:   Intra-op Plan:   Post-operative Plan: Extubation in OR  Informed Consent: I have reviewed the patients History and Physical, chart, labs and discussed the procedure including the risks, benefits and alternatives for the proposed anesthesia with the patient or authorized representative who has indicated his/her understanding and acceptance.     Dental Advisory Given  Plan Discussed with: Anesthesiologist, CRNA and Surgeon  Anesthesia Plan Comments: (Patient consented for risks of anesthesia including but not limited to:  - adverse reactions to medications - damage to eyes, teeth, lips or other oral mucosa - nerve damage due to positioning  - sore throat or hoarseness - Damage to heart, brain, nerves, lungs, other parts of body or loss of life  Patient voiced understanding.)       Anesthesia Quick Evaluation

## 2022-09-07 NOTE — Interval H&P Note (Signed)
No change. OK to proceed.

## 2022-09-07 NOTE — Discharge Instructions (Addendum)
AMBULATORY SURGERY  DISCHARGE INSTRUCTIONS   The drugs that you were given will stay in your system until tomorrow so for the next 24 hours you should not:  Drive an automobile Make any legal decisions Drink any alcoholic beverage   You may resume regular meals tomorrow.  Today it is better to start with liquids and gradually work up to solid foods.  You may eat anything you prefer, but it is better to start with liquids, then soup and crackers, and gradually work up to solid foods.   Please notify your doctor immediately if you have any unusual bleeding, trouble breathing, redness and pain at the surgery site, drainage, fever, or pain not relieved by medication.    Additional Instructions: Please contact your physician with any problems or Same Day Surgery at [phone], Monday through Friday 6 am to 4 pm, or [place] at [facility] number at [phone].  Laparoscopic Cholecystectomy, Care After This sheet gives you information about how to care for yourself after your procedure. Your doctor may also give you more specific instructions. If you have problems or questions, contact your doctor. Follow these instructions at home: Care for cuts from surgery (incisions)  Follow instructions from your doctor about how to take care of your cuts from surgery. Make sure you: Wash your hands with soap and water before you change your bandage (dressing). If you cannot use soap and water, use hand sanitizer. Change your bandage as told by your doctor. Leave stitches (sutures), skin glue, or skin tape (adhesive) strips in place. They may need to stay in place for 2 weeks or longer. If tape strips get loose and curl up, you may trim the loose edges. Do not remove tape strips completely unless your doctor says it is okay. Do not take baths, swim, or use a hot tub until your doctor says it is okay. OK TO SHOWER 24HRS AFTER YOUR SURGERY.  Check your surgical cut area every day for signs of  infection. Check for: More redness, swelling, or pain. More fluid or blood. Warmth. Pus or a bad smell. Activity Do not drive or use heavy machinery while taking prescription pain medicine. Do not play contact sports until your doctor says it is okay. Do not drive for 24 hours if you were given a medicine to help you relax (sedative). Rest as needed. Do not return to work or school until your doctor says it is okay. General instructions  tylenol as needed for discomfort.   Use narcotics, if prescribed, only when tylenol and motrin is not enough to control pain.  325-'650mg'$  every 8hrs to max of '3000mg'$ /24hrs (including the '325mg'$  in every norco dose) for the tylenol.   RESUME ASPIRN IN 48HRS   To prevent or treat constipation while you are taking prescription pain medicine, your doctor may recommend that you: Drink enough fluid to keep your pee (urine) clear or pale yellow. Take over-the-counter or prescription medicines. Eat foods that are high in fiber, such as fresh fruits and vegetables, whole grains, and beans. Limit foods that are high in fat and processed sugars, such as fried and sweet foods. Contact a doctor if: You develop a rash. You have more redness, swelling, or pain around your surgical cuts. You have more fluid or blood coming from your surgical cuts. Your surgical cuts feel warm to the touch. You have pus or a bad smell coming from your surgical cuts. You have a fever. One or more of your surgical cuts breaks open.  You have trouble breathing. You have chest pain. You have pain that is getting worse in your shoulders. You faint or feel dizzy when you stand. You have very bad pain in your belly (abdomen). You are sick to your stomach (nauseous) for more than one day. You have throwing up (vomiting) that lasts for more than one day. You have leg pain. This information is not intended to replace advice given to you by your health care provider. Make sure you discuss any  questions you have with your health care provider. Document Released: 07/03/2008 Document Revised: 04/14/2016 Document Reviewed: 03/12/2016 Elsevier Interactive Patient Education  2019 [employer].

## 2022-09-07 NOTE — Anesthesia Procedure Notes (Signed)
Procedure Name: Intubation Date/Time: 09/07/2022 8:56 AM  Performed by: Fredderick Phenix, CRNAPre-anesthesia Checklist: Patient identified, Emergency Drugs available, Suction available and Patient being monitored Patient Re-evaluated:Patient Re-evaluated prior to induction Oxygen Delivery Method: Circle system utilized Preoxygenation: Pre-oxygenation with 100% oxygen Induction Type: IV induction Ventilation: Mask ventilation without difficulty Laryngoscope Size: McGraph and 4 Grade View: Grade I Tube type: Oral Tube size: 7.0 mm Number of attempts: 1 Airway Equipment and Method: Stylet and Oral airway Placement Confirmation: ETT inserted through vocal cords under direct vision, positive ETCO2 and breath sounds checked- equal and bilateral Secured at: 21 cm Tube secured with: Tape Dental Injury: Teeth and Oropharynx as per pre-operative assessment

## 2022-09-07 NOTE — Anesthesia Postprocedure Evaluation (Signed)
Anesthesia Post Note  Patient: Michelle Norris  Procedure(s) Performed: XI ROBOTIC ASSISTED LAPAROSCOPIC CHOLECYSTECTOMY (Abdomen)  Patient location during evaluation: PACU Anesthesia Type: General Level of consciousness: awake and alert Pain management: pain level controlled Vital Signs Assessment: post-procedure vital signs reviewed and stable Respiratory status: spontaneous breathing, nonlabored ventilation, respiratory function stable and patient connected to nasal cannula oxygen Cardiovascular status: blood pressure returned to baseline and stable Postop Assessment: no apparent nausea or vomiting Anesthetic complications: no   No notable events documented.   Last Vitals:  Vitals:   09/07/22 1045 09/07/22 1100  BP: 104/66 119/69  Pulse: 71 71  Resp: 16 13  Temp:    SpO2: 99% 90%    Last Pain:  Vitals:   09/07/22 1100  PainSc: 6                  Ilene Qua

## 2022-09-10 LAB — SURGICAL PATHOLOGY

## 2022-09-11 ENCOUNTER — Encounter: Payer: Self-pay | Admitting: Dermatology

## 2022-09-26 HISTORY — PX: CARPAL TUNNEL RELEASE: SHX101

## 2022-10-16 ENCOUNTER — Other Ambulatory Visit: Payer: Self-pay | Admitting: Dermatology

## 2022-10-16 DIAGNOSIS — L905 Scar conditions and fibrosis of skin: Secondary | ICD-10-CM

## 2022-10-17 NOTE — Progress Notes (Signed)
Referring Physician:  Marinda Elk, [facility] [place],  [place] [address]  Primary Physician:  Marinda Elk, MD  History of Present Illness: History of afib, OSA, HTN, CVA, migraines, s/p gastric bypass.   Last seen by me on 07/03/22 for neck and right shoulder pain. She has known cervical spondylosis with DDD C5-C6 with disc bulge and moderate right, severe left foraminal stenosis. Also with DDD C6-C7 with minimal central disc and moderate right foraminal narrowing.    Discussed that neck pain is likely from cervical DDD/spondylosis.   She had right shoulder scope on 08/28/22. She is in postop PT for her right shoulder. She is feeling better, but still has pain.   She also had robotic assisted lap cholecystectomy on 09/07/22 and right carpal tunnel release on 09/26/22.   She is here for follow up.   She continues with constant right sided neck pain that radiates up to the base of her skull. Pain varies in severity. She has a dull headache when pain is bad. She also has migraines, but these do not seem to be associated with her neck pain.   She has seen neurology Manuella Ghazi) and she did see improvement with occipital nerve block and trigger point injections on 03/29/22.   She was taken off plavix. She is weaning down on her neurontin- would like to stop it soon. She has history of gastric bypass.   Conservative measures:  Physical therapy: in PT for her shoulder Multimodal medical therapy including regular antiinflammatories: none. She is on PLAVIX.   Injections: No epidural steroid injections.  Had posterior cervical and shoulder trigger point injections that helped with headaches.   Past Surgery: no cervical surgery. Has inspire implantable CPAP.   CARMELITA AMPARO has chronic balance issues due to cardiac issues. Nothing new.   Review of Systems:  A 10 point review of systems is negative, except for the pertinent positives and  negatives detailed in the HPI.  Past Medical History: Past Medical History:  Diagnosis Date   Aortic atherosclerosis ([place])    Atrial fibrillation ([place])    a.) CHA2DS2VASc = 6 (sex, CHF, HTN, CVA x2, vascular disease history);  b.) s/p DCCV (150 J x1) 12/02/2017; c.) s/p Watchman device placement 03/01/2022; d.) rate/rhythm maintained now without pharmacological intervention; chronically anticoagulated with clopidogrel   Atypical mole 08/03/2021   L sacral, moderate atypia   Bradycardia    CAD (coronary artery disease)    a.) LHC 10/15/2017: EF 35-45%, LVEDP norm, normal coronaries; b.) cCTA 08/10/2021: EF 67%. CAC score = 19.5 (90th percentile for age/sex match); c.) cCTA 05/01/2022: CAC score 13 (87th percentile for age/sex match)   Cerebellar stroke ([place]) 10/18/2017   a.) MRI brain 10/18/2017: 5 mm acute infarct right superior cerebellum   CHF (congestive heart failure) ([place])    History of Roux-en-Y gastric bypass    Hyperlipidemia    Hypertension    Leukocytosis 07/2018   being monitored by pcp   Long term current use of antithrombotics/antiplatelets    a.) clopidogrel   Migraines    NICM (nonischemic cardiomyopathy) ([place])    a.) TTE 09/17/2017: EF 30%; b.) MPI 09/20/2017: EF 45-50%; c.) LHC 10/15/2017: EF 35-45%; d.) TTE 03/23/2020: EF 45%; e.) TTE 05/08/2021: EF 50%; f.) cCTA 08/10/2021: EF 67%   NSVT (nonsustained ventricular tachycardia) ([place]) 03/26/2022   a.) Holter 03/26/2022 - 5 runs of NSVT with the longest lasting 18 beats   Obesity  OSA (obstructive sleep apnea)    a.) no nocturnal PAP therapy; has Inspire implant   Osteoarthritis    Peripheral edema    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   a.) s/p Watchman FLX 27 mm device placement   PSVT (paroxysmal supraventricular tachycardia) 03/26/2022   a.) Holter 03/26/2022 - 4 runs of SVT with the longest lasting 19 beats   [facility] spotted fever 06/2018   bulls-eye rash on back    Vitamin B12 deficiency     Past Surgical History: Past Surgical History:  Procedure Laterality Date   BICEPT TENODESIS Left 08/25/2018   Procedure: BICEPS TENODESIS;  Surgeon: Leim Fabry, MD;  Location: [facility] OR[facility];  Service: Orthopedics;  Laterality: Left;   CARDIOVERSION N/A 12/02/2017   Procedure: CARDIOVERSION;  Surgeon: Yolonda Kida, MD;  Location: [facility] OR[facility];  Service: Cardiovascular;  Laterality: N/A;   LEFT ATRIAL APPENDAGE OCCLUSION N/A 03/01/2022   Procedure: LEFT ATRIAL APPENDAGE OCCLUSION;  Surgeon: Vickie Epley, MD;  Location: [facility];  Service: Cardiovascular;  Laterality: N/A;   LEFT HEART CATH AND CORONARY ANGIOGRAPHY Left 10/15/2017   Procedure: LEFT HEART CATH AND CORONARY ANGIOGRAPHY;  Surgeon: Yolonda Kida, MD;  Location: [facility];  Service: Cardiovascular;  Laterality: Left;   LEFT HEART CATH AND CORONARY ANGIOGRAPHY Left 10/15/2017   Procedure: LEFT HEART CATH AND CORONARY ANGIOGRAPHY; Location: [facility]; Surgeon: Katrine Coho, MD   ORIF [place] Right 2004   fall on right ; ANKLE FRACTURE   SHOULDER ARTHROSCOPY WITH OPEN ROTATOR CUFF REPAIR Left 08/25/2018   Procedure: SHOULDER ARTHROSCOPY WITH SUBCROMIAL DECOMPRESSION  DISTAL CLAVICLE EXCISION VS.OPEN ROTATOR CUFF REPAIR;  Surgeon: Leim Fabry, MD;  Location: [facility];  Service: Orthopedics;  Laterality: Left;   SHOULDER ARTHROSCOPY WITH SUBACROMIAL DECOMPRESSION AND OPEN ROTATOR C Right 08/28/2022   Procedure: Right shoulder arthroscopic rotator cuff repair, subacromial decompression, distal clavicle excision, and biceps tenodesis;  Surgeon: Leim Fabry, MD;  Location: [facility];  Service: Orthopedics;  Laterality: Right;   SUPRACERVICAL ABDOMINAL HYSTERECTOMY N/A 2013   TEE WITHOUT CARDIOVERSION N/A 03/01/2022   Procedure: TRANSESOPHAGEAL ECHOCARDIOGRAM (TEE);  Surgeon: Vickie Epley, MD;  Location: [facility];  Service: Cardiovascular;  Laterality:  N/A;   TEMPOROMANDIBULAR JOINT ARTHROSCOPY Bilateral    1994    Allergies: Allergies as of 10/23/2022 - Review Complete 10/23/2022  Allergen Reaction Noted   Wasp venom Shortness Of Breath and Swelling 09/24/2017   Amoxicillin Other (See Comments) 08/21/2018   Latex Swelling and Other (See Comments) 09/24/2017   Tape Itching and Rash 04/03/2021    Medications: Outpatient Encounter Medications as of 10/23/2022  Medication Sig   acetaminophen (TYLENOL) 500 MG tablet Take 500-1,000 mg by mouth every 6 (six) hours as needed (pain.).   Acidophilus Lactobacillus CAPS Take 2 capsules by mouth in the morning and at bedtime.   atorvastatin (LIPITOR) 20 MG tablet Take 20 mg by mouth every evening.   Calcium Carb-Cholecalciferol (CALCIUM + D3 PO) Take 1 tablet by mouth in the morning.   COLLAGEN PO Take 2 Scoops by mouth in the morning.   estradiol (ESTRACE) 0.1 MG/GM vaginal cream Place 1 Applicatorful vaginally daily as needed (vaginal dryness.).   Fremanezumab-vfrm (AJOVY) 225 MG/1.5ML SOAJ Inject 225 mg into the skin every 30 (thirty) days.   gabapentin (NEURONTIN) 300 MG capsule Take 300-600 mg by mouth 3 (three) times daily. Take 1 capsules (300 mg) by Katherina Mires in themorning & take  2 capsules (600 mg) by mouth at night.   Ivermectin (SOOLANTRA) 1 % CREA Apply 1 Application topically at bedtime.   LINZESS 290 MCG CAPS capsule Take 290 mcg by mouth daily as needed (mild constipation.).   metoprolol tartrate (LOPRESSOR) 25 MG tablet Take 1 tablet (25 mg total) by mouth 2 (two) times daily. You may take an extra tablet (25 mg) as needed for heart rate greater than 130.   Multiple Vitamins-Minerals (BARIATRIC MULTIVITAMINS/IRON PO) Take 1 tablet by mouth in the morning. BariatricPal Multivitamin   mupirocin ointment (BACTROBAN) 2 % Apply 1 Application topically daily. With dressing changes   NURTEC 75 MG TBDP Take 75 mg by mouth daily as needed (migraine/headaches.).   nystatin (MYCOSTATIN/NYSTOP)  powder Apply 1 application. topically daily. Applied to skin folds   triamcinolone cream (KENALOG) 0.1 % APPLY TOPICALLY EACH DAY AS DIRECTED FOR 2 WEEKS, THEN DECREASE TO DAILY UP TO 5 DAYS PER WEEK.   vitamin B-12 (CYANOCOBALAMIN) 1000 MCG tablet Take 1,000 mcg by mouth in the morning.   [DISCONTINUED] acetaminophen (TYLENOL) 500 MG tablet Take 2 tablets (1,000 mg total) by mouth every 8 (eight) hours.   [DISCONTINUED] BIOTIN PO Take 1 tablet by mouth in the morning.   [DISCONTINUED] diphenhydrAMINE (BENADRYL) 25 mg capsule Take 25 mg by mouth every 6 (six) hours as needed for allergies (sinus).   [DISCONTINUED] HYDROcodone-acetaminophen (NORCO) 5-325 MG tablet Take 1-2 tablets by mouth every 4 (four) hours as needed for moderate pain or severe pain.   [DISCONTINUED] HYDROcodone-acetaminophen (NORCO) 5-325 MG tablet Take 1 tablet by mouth every 6 (six) hours as needed for up to 6 doses for moderate pain.   [DISCONTINUED] Multiple Minerals-Vitamins (CAL MAG ZINC +D3 PO) Take 1 tablet by mouth every evening.   [DISCONTINUED] ondansetron (ZOFRAN-ODT) 4 MG disintegrating tablet Allow 1-2 tablets to dissolve in your mouth every 8 hours as needed for nausea/vomiting   [DISCONTINUED] ondansetron (ZOFRAN-ODT) 4 MG disintegrating tablet Take 1 tablet (4 mg total) by mouth every 8 (eight) hours as needed for nausea or vomiting.   No facility-administered encounter medications on file as of 10/23/2022.    Social History: Social History   Tobacco Use   Smoking status: Never   Smokeless tobacco: Never  Vaping Use   Vaping Use: Never used  Substance Use Topics   Alcohol use: Not Currently    Comment: occasional/ socially   Drug use: No    Family Medical History: Family History  Problem Relation Age of Onset   Breast cancer Maternal Grandmother    Diabetes Maternal Grandmother    Dementia Maternal Grandmother    Atrial fibrillation Father    Hypertension Father    Congestive Heart Failure  Father    Diabetes Paternal Grandmother    Heart attack Paternal Grandmother     Physical Examination: Vitals:   10/23/22 1107  BP: 112/72      Awake, alert, oriented to person, place, and time.  Speech is clear and fluent. Fund of knowledge is appropriate.   Cranial Nerves: Pupils equal round and reactive to light.  Facial tone is symmetric.    Well healed anterior cervical incision on left side.   She has mild posterior cervical and right trapezial tenderness. Some tenderness in occipital region.   Strength: Side Biceps Triceps Deltoid Interossei Grip Wrist Ext. Wrist Flex.  R '5 5 5 5 5 5 5  '$ L '5 5 5 5 5 5 5   '$ Side Iliopsoas Quads Hamstring PF DF EHL  R '5 5 5 5 5 5  '$ L '5 5 5 5 5 5   '$ Reflexes are 2+ and symmetric at the biceps, triceps, brachioradialis, patella and achilles.   Hoffman's is absent.  Clonus is not present.   Bilateral upper and lower extremity sensation is intact to light touch.     Gait is normal.    Medical Decision Making  Imaging: No updated cervical imaging.   Assessment and Plan: Ms. Michelle Norris continues with constant right sided neck pain that radiates up to the base of her skull. Pain varies in severity. She has a dull headache when pain is bad.   She has known cervical spondylosis with DDD C5-C6 with disc bulge and moderate right, severe left foraminal stenosis. Also with DDD C6-C7 with minimal central disc and moderate right foraminal narrowing.   Neck pain appears more myofascial in nature. May have component of occipital neuralgia as well.   She had right shoulder scope on 08/28/22. She also had robotic assisted lap cholecystectomy on 09/07/22 and right carpal tunnel release on 09/26/22.   Treatment options discussed with patient and following plan made:   - PT for cervical spine. Will send orders to [facility] in [place]. She is seeing them for her shoulder and will discuss with PT today.  - Referral to PMR for evaluation of cervical injections  (?TPI versus occipital nerve block).  - Follow up with me for phone visit in 6-8 weeks.   I spent a total of 20 minutes in face-to-face and non-face-to-face activities related to this patient's care today including review of outside records, review of imaging, review of symptoms, physical exam, discussion of differential diagnosis, discussion of treatment options, and documentation.   Geronimo Boot PA-C Dept. of Neurosurgery

## 2022-10-23 ENCOUNTER — Encounter: Payer: Self-pay | Admitting: Orthopedic Surgery

## 2022-10-23 ENCOUNTER — Ambulatory Visit (INDEPENDENT_AMBULATORY_CARE_PROVIDER_SITE_OTHER): Payer: No Typology Code available for payment source | Admitting: Orthopedic Surgery

## 2022-10-23 VITALS — BP 112/72 | Ht 68.0 in | Wt 194.4 lb

## 2022-10-23 DIAGNOSIS — M47812 Spondylosis without myelopathy or radiculopathy, cervical region: Secondary | ICD-10-CM

## 2022-10-23 DIAGNOSIS — M5030 Other cervical disc degeneration, unspecified cervical region: Secondary | ICD-10-CM | POA: Diagnosis not present

## 2022-10-23 NOTE — Patient Instructions (Addendum)
It was so nice to see you today, I am sorry that you are hurting so much.   I put in PT orders for the [facility]. Talk to them today when you see them for your shoulder.   I also want you to see physical medicine and rehab at the [facility] to discuss possible injections in your neck. Dr. Sharlet Salina, Dr. Alba Destine, and their PA Loree Fee are great and will take good care of you. They should call you to schedule an appointment or you can call them at [phone].    I am sorry that you had such a rough and eventful 2023, I hope this year is better for you!  You have a phone visit scheduled with me in March. Let me know if you need anything prior to this.   Please do not hesitate to call if you have any questions or concerns. You can also message me in [place].   Geronimo Boot PA-C [phone]

## 2022-11-16 ENCOUNTER — Other Ambulatory Visit: Payer: Self-pay | Admitting: Orthopedic Surgery

## 2022-11-16 DIAGNOSIS — S46011D Strain of muscle(s) and tendon(s) of the rotator cuff of right shoulder, subsequent encounter: Secondary | ICD-10-CM

## 2022-11-20 ENCOUNTER — Ambulatory Visit
Admission: RE | Admit: 2022-11-20 | Discharge: 2022-11-20 | Disposition: A | Discharge status: Home / Self Care | Payer: No Typology Code available for payment source | Source: Ambulatory Visit | Attending: Orthopedic Surgery | Admitting: Orthopedic Surgery

## 2022-11-20 DIAGNOSIS — S46011D Strain of muscle(s) and tendon(s) of the rotator cuff of right shoulder, subsequent encounter: Secondary | ICD-10-CM | POA: Insufficient documentation

## 2022-11-26 ENCOUNTER — Other Ambulatory Visit: Payer: Self-pay | Admitting: Orthopedic Surgery

## 2022-11-29 ENCOUNTER — Encounter
Admission: RE | Admit: 2022-11-29 | Discharge: 2022-11-29 | Disposition: A | Discharge status: Home / Self Care | Payer: No Typology Code available for payment source | Source: Ambulatory Visit | Attending: Orthopedic Surgery | Admitting: Orthopedic Surgery

## 2022-11-29 VITALS — Ht 68.0 in | Wt 187.0 lb

## 2022-11-29 DIAGNOSIS — Z01812 Encounter for preprocedural laboratory examination: Secondary | ICD-10-CM

## 2022-11-29 DIAGNOSIS — I482 Chronic atrial fibrillation, unspecified: Secondary | ICD-10-CM

## 2022-11-29 DIAGNOSIS — Z95818 Presence of other cardiac implants and grafts: Secondary | ICD-10-CM

## 2022-11-29 HISTORY — DX: Endometriosis, unspecified: N80.9

## 2022-11-29 NOTE — Patient Instructions (Addendum)
Your procedure is scheduled on: Thursday, February 29 Report to the Registration Desk on the 1st floor of the [facility]. To find out your arrival time, please call [phone] between 1PM - 3PM on: Wednesday, February 28 If your arrival time is 6:00 am, do not arrive before that time as the [facility] entrance doors do not open until 6:00 am.  REMEMBER: Instructions that are not followed completely may result in serious medical risk, up to and including death; or upon the discretion of your surgeon and anesthesiologist your surgery may need to be rescheduled.  Do not eat food after midnight the night before surgery.  No gum chewing or hard candies.  You may however, drink CLEAR liquids up to 2 hours before you are scheduled to arrive for your surgery. Do not drink anything within 2 hours of your scheduled arrival time.  Clear liquids include: - water  - apple juice without pulp - gatorade (not RED colors) - black coffee or tea (Do NOT add milk or creamers to the coffee or tea) Do NOT drink anything that is not on this list.  In addition, your doctor has ordered for you to drink the provided:  Ensure Pre-Surgery Clear Carbohydrate Drink  Drinking this carbohydrate drink up to two hours before surgery helps to reduce insulin resistance and improve patient outcomes. Please complete drinking 2 hours before scheduled arrival time.  One week prior to surgery: starting February 22 Stop Anti-inflammatories (NSAIDS) such as Advil, Aleve, Ibuprofen, Motrin, Naproxen, Naprosyn and Aspirin based products such as Excedrin, Goody's Powder, BC Powder. Stop ANY OVER THE COUNTER supplements until after surgery. Stop probiotic, calcium,mag,zinc, vitamin D, vitamin A, multiple vitamins, collagen You may however, continue to take Tylenol if needed for pain up until the day of surgery.  Continue taking all prescribed medications with the exception of the following:  Follow recommendations from  Cardiologist or PCP regarding stopping aspirin. (Usually hold for 5 days before surgery.) Patient will call Dr. Clayborn Bigness to verify that it is ok to stop the aspirin for 5 days before surgery.  TAKE ONLY THESE MEDICATIONS THE MORNING OF SURGERY WITH A SIP OF WATER:  Gabapentin Metoprolol  No Alcohol for 24 hours before or after surgery.  No Smoking including e-cigarettes for 24 hours before surgery.  No chewable tobacco products for at least 6 hours before surgery.  No nicotine patches on the day of surgery.  Do not use any "recreational" drugs for at least a week (preferably 2 weeks) before your surgery.  Please be advised that the combination of cocaine and anesthesia may have negative outcomes, up to and including death. If you test positive for cocaine, your surgery will be cancelled.  On the morning of surgery brush your teeth with toothpaste and water, you may rinse your mouth with mouthwash if you wish. Do not swallow any toothpaste or mouthwash.  Use CHG Soap as directed on instruction sheet.  Do not wear jewelry, make-up, hairpins, clips or nail polish.  Do not wear lotions, powders, or perfumes.   Do not shave body hair from the neck down 48 hours before surgery.  Contact lenses, hearing aids and dentures may not be worn into surgery.  Do not bring valuables to the hospital. [facility] is not responsible for any missing/lost belongings or valuables.   Notify your doctor if there is any change in your medical condition (cold, fever, infection).  Wear comfortable clothing (specific to your surgery type) to the hospital.  After  surgery, you can help prevent lung complications by doing breathing exercises.  Take deep breaths and cough every 1-2 hours. Your doctor may order a device called an Incentive Spirometer to help you take deep breaths.  If you are being discharged the day of surgery, you will not be allowed to drive home. You will need a responsible individual to  drive you home and stay with you for 24 hours after surgery.   If you are taking public transportation, you will need to have a responsible individual with you.  Please call the [facility] Dept. at [phone] if you have any questions about these instructions.  Surgery Visitation Policy:  Patients undergoing a surgery or procedure may have two family members or support persons with them as long as the person is not COVID-19 positive or experiencing its symptoms.      Preparing for Surgery with CHLORHEXIDINE GLUCONATE (CHG) Soap  Chlorhexidine Gluconate (CHG) Soap  o An antiseptic cleaner that kills germs and bonds with the skin to continue killing germs even after washing  o Used for showering the night before surgery and morning of surgery  Before surgery, you can play an important role by reducing the number of germs on your skin.  CHG (Chlorhexidine gluconate) soap is an antiseptic cleanser which kills germs and bonds with the skin to continue killing germs even after washing.  Please do not use if you have an allergy to CHG or antibacterial soaps. If your skin becomes reddened/irritated stop using the CHG.  1. Shower the NIGHT BEFORE SURGERY and the MORNING OF SURGERY with CHG soap.  2. If you choose to wash your hair, wash your hair first as usual with your normal shampoo.  3. After shampooing, rinse your hair and body thoroughly to remove the shampoo.  4. Use CHG as you would any other liquid soap. You can apply CHG directly to the skin and wash gently with a scrungie or a clean washcloth.  5. Apply the CHG soap to your body only from the neck down. Do not use on open wounds or open sores. Avoid contact with your eyes, ears, mouth, and genitals (private parts). Wash face and genitals (private parts) with your normal soap.  6. Wash thoroughly, paying special attention to the area where your surgery will be performed.  7. Thoroughly rinse your body with warm  water.  8. Do not shower/wash with your normal soap after using and rinsing off the CHG soap.  9. Pat yourself dry with a clean towel.  10. Wear clean pajamas to bed the night before surgery.  12. Place clean sheets on your bed the night of your first shower and do not sleep with pets.  13. Shower again with the CHG soap on the day of surgery prior to arriving at the hospital.  14. Do not apply any deodorants/lotions/powders.  15. Please wear clean clothes to the hospital.

## 2022-11-30 ENCOUNTER — Encounter
Admission: RE | Admit: 2022-11-30 | Discharge: 2022-11-30 | Disposition: A | Discharge status: Home / Self Care | Payer: No Typology Code available for payment source | Source: Ambulatory Visit | Attending: Orthopedic Surgery | Admitting: Orthopedic Surgery

## 2022-11-30 DIAGNOSIS — Z95818 Presence of other cardiac implants and grafts: Secondary | ICD-10-CM | POA: Insufficient documentation

## 2022-11-30 DIAGNOSIS — I482 Chronic atrial fibrillation, unspecified: Secondary | ICD-10-CM | POA: Insufficient documentation

## 2022-11-30 DIAGNOSIS — Z01812 Encounter for preprocedural laboratory examination: Secondary | ICD-10-CM | POA: Insufficient documentation

## 2022-11-30 LAB — BASIC METABOLIC PANEL
Anion gap: 8 (ref 5–15)
BUN: 12 mg/dL (ref 6–20)
CO2: 27 mmol/L (ref 22–32)
Calcium: 9 mg/dL (ref 8.9–10.3)
Chloride: 104 mmol/L (ref 98–111)
Creatinine, Ser: 0.52 mg/dL (ref 0.44–1.00)
GFR, Estimated: >60 mL/min (ref >60)
Glucose, Bld: 59 mg/dL — ABNORMAL LOW (ref 70–99)
Potassium: 3.7 mmol/L (ref 3.5–5.1)
Sodium: 139 mmol/L (ref 135–145)

## 2022-11-30 LAB — CBC
HCT: 41.1 % (ref 36.0–46.0)
Hemoglobin: 13.5 g/dL (ref 12.0–15.0)
MCH: 29.7 pg (ref 26.0–34.0)
MCHC: 32.8 g/dL (ref 30.0–36.0)
MCV: 90.3 fL (ref 80.0–100.0)
Platelets: 239 10*3/uL (ref 150–400)
RBC: 4.55 MIL/uL (ref 3.87–5.11)
RDW: 12.7 % (ref 11.5–15.5)
WBC: 5.3 10*3/uL (ref 4.0–10.5)
nRBC: 0 % (ref 0.0–0.2)

## 2022-11-30 NOTE — Progress Notes (Signed)
Perioperative / Anesthesia Services  Pre-Admission Testing Clinical Review / Preoperative Anesthesia Consult  Date: 11/30/22  Patient Demographics:  Name: Michelle Norris DOB:   [birth year] MRN:   [id number]  Planned Surgical Procedure(s):    Case: [id number] Date/Time: 12/03/22 0903   Procedure: Right shoulder revision arthroscopic vs mini open rotator cuff with possible allograft vs Regeneten patch augmentation (Right)   Anesthesia type: Choice   Pre-op diagnosis: Complete tear of right rotator cuff, unspecified whether traumatic M75.121   Location: [place] [facility]   Surgeons: Leim Fabry, MD     NOTE: Available PAT nursing documentation and vital signs have been reviewed. Clinical nursing staff has updated patient's PMH/PSHx, current medication list, and drug allergies/intolerances to ensure comprehensive history available to assist in medical decision making as it pertains to the aforementioned surgical procedure and anticipated anesthetic course. Extensive review of available clinical information personally performed.  PMH and PSHx updated with any diagnoses/procedures that  may have been inadvertently omitted during her intake with the pre-admission testing department's nursing staff.  Clinical Discussion:  Michelle Norris is a 52 y.o. female who is submitted for pre-surgical anesthesia review and clearance prior to her undergoing the above procedure. Patient has never been a smoker. Pertinent PMH includes: CAD, NICM, CHF, atrial fibrillation (s/p LAA closure), cerebellar CVA, NSVT, PSVT, aortic atherosclerosis, HTN, HLD, OSAH (no nocturnal PAP therapy; has inspire device), OA, peripheral edema.   Patient is followed by cardiology Michelle Bigness, MD). She was last seen in the cardiology clinic on 07/04/2022; notes reviewed. At the time of her clinic visit, patient reporting episodes of exertional dyspnea and peripheral edema. Patient denied any chest  pain, PND, orthopnea, palpitations, weakness, fatigue, vertiginous symptoms, or presyncope/syncope. Patient with a past medical history significant for cardiovascular diagnoses. Documented physical exam was grossly benign, providing no evidence of acute exacerbation and/or decompensation of the patient's known cardiovascular conditions.  Diagnostic LEFT heart catheterization was performed on 10/15/2017 revealing a moderately reduced left ventricular systolic function with an EF of 35-45%. LVEDP was normal.  Study demonstrated normal coronary anatomy with no evidence of obstructive CAD.   MRI of the brain performed on 10/18/2017 revealed a 5 mm acute infarct in the RIGHT superior cerebellum.  Patient with no residual neurological deficits related to neurovascular event.   Patient with a documented history of nonischemic cardiomyopathy.  TTE back in 09/2017 demonstrated a reduced EF of 30%.  Cardiac function has been serially monitored since diagnosis.  Most recent TTE performed on 05/08/2021 revealed a low normal left ventricular systolic function with an EF of 50%.   Coronary CTA performed on 08/10/2021 revealed a normal left ventricular systolic function with a hyperdynamic LVEF of 67%.  Coronary calcium score was 19.5, which was 90th percentile for age and sex matched control.   Holter study performed on 03/26/2022 demonstrated 4 runs of SVT with the longest lasting 19 beats and 5 episodes of NSVT with the longest lasting 18 beats.   Repeat coronary CTA performed on 05/01/2022 revealed a coronary calcium score of 13, which was in the 38 percentile for age and sex matched control.  Patient with an atrial fibrillation diagnosis; CHA2DS2-VASc Score = 6 (age, CHF, HTN, CVA x 2, vascular disease history).patient is status post a DCCV procedure on 12/02/2017, at which time she received a single 150 J synchronized cardioversion restoring NSR.  Patient went on to develop refractory atrial dysrhythmia, and  ultimately had LAA closure Clinical cytogeneticist  device) performed on 03/01/2022.  Her rate and rhythm are currently being maintained without the use of pharmacological intervention.  Following the LAA closure, patient remains on daily antiplatelet therapy using clopidogrel.  Patient reportedly compliant with therapy with no evidence or report of GI bleeding.  Blood pressure reasonably controlled at 128/76 mmHg on currently prescribed beta-blocker (metoprolol tartrate) monotherapy.  Patient is on atorvastatin for HLD diagnosis and ASCVD prevention.  She is not diabetic.  Patient does have an OSAH diagnosis, however does not require nocturnal PAP therapy following Inspire device placement.  Patient making effort to remain active.  Functional capacity, per the DASI, places patient at the ability to achieve at least 4 METS of physical activity without experiencing any degree of angina/anginal equivalent symptoms.  No changes were made to her medication regimen during her visit with cardiology.  Patient to follow-up with outpatient cardiology in 6 months or sooner if needed.   Michelle Norris is scheduled for an RIGHT SHOULDER REVISION ARTHROSCOPIC VS MINI OPEN ROTATOR CUFF WITH POSSIBLE ALLOGRAFT VS REGENETEN PATCH AUGMENTATION on 12/03/2022 with Dr. Leim Fabry, MD.  Given patient's past medical history significant for cardiovascular diagnoses, presurgical cardiac clearance was sought by the PAT team. Per cardiology, "this patient is optimized for surgery and may proceed with the planned procedural course with a LOW risk of significant perioperative cardiovascular complications".    Again, this patient is on daily antiplatelet therapy. She has been instructed on recommendations for holding her daily low dose ASA for 5 days prior to her procedure with plans to restart as soon as postoperative bleeding risk felt to be minimized by her attending surgeon. Surgery moved from original date. Office has communicated new instructions  to patient regarding plans to hold her ASA.   Patient denies previous perioperative complications with anesthesia in the past. In review of the available records, it is noted that patient underwent a general anesthetic course here at [facility] ([facility] in 09/2022 without documented complications.      11/29/2022    2:57 PM 10/23/2022   11:07 AM 09/07/2022   11:43 AM  Vitals with BMI  Height '5\' 8"'$  '5\' 8"'$    Weight 187 lbs 194 lbs 6 oz   BMI [id number]   Systolic  [id number]  Diastolic  72 80  Pulse   62    Providers/Specialists:   NOTE: Primary physician provider listed below. Patient may have been seen by APP or partner within same practice.   PROVIDER ROLE / SPECIALTY LAST Rossie Muskrat, MD Orthopedics (Surgeon) 11/26/2022  Marinda Elk, MD Primary Care Provider 03/26/2022  Katrine Coho, MD Cardiology 07/04/2022  Mee Hives, MD Endocrinology 04/23/2022  Jennings Books, MD Neurology 05/18/2022   Allergies:  Wasp venom, Amoxicillin, Latex, and Tape  Current Home Medications:   No current facility-administered medications for this encounter.    acetaminophen (TYLENOL) 500 MG tablet   Acidophilus Lactobacillus CAPS   aspirin EC 81 MG tablet   atorvastatin (LIPITOR) 20 MG tablet   Calcium Carb-Cholecalciferol (CALCIUM + D3 PO)   CALCIUM MAGNESIUM ZINC PO   Cholecalciferol (VITAMIN D) 125 MCG (5000 UT) CAPS   COLLAGEN PO   estradiol (ESTRACE) 0.1 MG/GM vaginal cream   Fremanezumab-vfrm (AJOVY) 225 MG/1.5ML SOAJ   gabapentin (NEURONTIN) 300 MG capsule   Ivermectin (SOOLANTRA) 1 % CREA   metoprolol tartrate (LOPRESSOR) 25 MG tablet   Multiple Vitamins-Minerals (BARIATRIC MULTIVITAMINS/IRON PO)   NURTEC 75 MG TBDP  nystatin (MYCOSTATIN/NYSTOP) powder   VITAMIN A PO   vitamin B-12 (CYANOCOBALAMIN) 1000 MCG tablet   History:   Past Medical History:  Diagnosis Date   Aortic atherosclerosis (HCC)    Atrial fibrillation  (HCC)    a.) CHA2DS2VASc = 6 (sex, CHF, HTN, CVA x2, vascular disease history);  b.) s/p DCCV (150 J x1) 12/02/2017; c.) s/p Watchman device placement 03/01/2022; d.) rate/rhythm maintained now without pharmacological intervention; chronically anticoagulated with clopidogrel   Atypical mole 08/03/2021   L sacral, moderate atypia   Bradycardia    CAD (coronary artery disease)    a.) LHC 10/15/2017: EF 35-45%, LVEDP norm, normal coronaries; b.) cCTA 08/10/2021: EF 67%. CAC score = 19.5 (90th percentile for age/sex match); c.) cCTA 05/01/2022: CAC score 13 (87th percentile for age/sex match)   Cardiomyopathy ([place])    Cerebellar stroke ([place]) 10/18/2017   a.) MRI brain 10/18/2017: 5 mm acute infarct right superior cerebellum   CHF (congestive heart failure) ([place])    Endometriosis    History of Roux-en-Y gastric bypass    Hyperlipidemia    Hypertension    Leukocytosis 07/2018   being monitored by pcp   Long term current use of antithrombotics/antiplatelets    a.) clopidogrel   Migraines    NICM (nonischemic cardiomyopathy) ([place])    a.) TTE 09/17/2017: EF 30%; b.) MPI 09/20/2017: EF 45-50%; c.) LHC 10/15/2017: EF 35-45%; d.) TTE 03/23/2020: EF 45%; e.) TTE 05/08/2021: EF 50%; f.) cCTA 08/10/2021: EF 67%   NSVT (nonsustained ventricular tachycardia) ([place]) 03/26/2022   a.) Holter 03/26/2022 - 5 runs of NSVT with the longest lasting 18 beats   Obesity    OSA (obstructive sleep apnea)    a.) no nocturnal PAP therapy; has Inspire implant   Osteoarthritis    Peripheral edema    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   a.) s/p Watchman FLX 27 mm device placement   PSVT (paroxysmal supraventricular tachycardia) 03/26/2022   a.) Holter 03/26/2022 - 4 runs of SVT with the longest lasting 19 beats   [facility] spotted fever 06/2018   bulls-eye rash on back   Vitamin B12 deficiency    Past Surgical History:  Procedure Laterality Date   BICEPT TENODESIS Left  08/25/2018   Procedure: BICEPS TENODESIS;  Surgeon: Leim Fabry, MD;  Location: [facility];  Service: Orthopedics;  Laterality: Left;   BLADDER TACK  2013   WITH HYSTERECTOMY   CARDIOVERSION N/A 12/02/2017   Procedure: CARDIOVERSION;  Surgeon: Yolonda Kida, MD;  Location: [facility];  Service: Cardiovascular;  Laterality: N/A;   CARPAL TUNNEL RELEASE Right 09/26/2022   COLONOSCOPY WITH ESOPHAGOGASTRODUODENOSCOPY (EGD)  05/18/2020   ESOPHAGOGASTRODUODENOSCOPY  08/29/2020   INCISION AND DRAINAGE ABSCESS  08/07/2022   INSPIRE IMPLANT     LAPAROSCOPIC GASTRIC BYPASS  08/28/2020   LEFT ATRIAL APPENDAGE OCCLUSION N/A 03/01/2022   Procedure: LEFT ATRIAL APPENDAGE OCCLUSION;  Surgeon: Vickie Epley, MD;  Location: [place] [facility];  Service: Cardiovascular;  Laterality: N/A;   LEFT HEART CATH AND CORONARY ANGIOGRAPHY Left 10/15/2017   Procedure: LEFT HEART CATH AND CORONARY ANGIOGRAPHY;  Surgeon: Yolonda Kida, MD;  Location: [place] [facility];  Service: Cardiovascular;  Laterality: Left;   LEFT HEART CATH AND CORONARY ANGIOGRAPHY Left 10/15/2017   Procedure: LEFT HEART CATH AND CORONARY ANGIOGRAPHY; Location: [place]; Surgeon: Katrine Coho, MD   ORIF [place] Right 2004   fall on right ; [place]  LAPAROSCOPIC CHOLECYSTECTOMY  09/07/2022   SHOULDER ARTHROSCOPY WITH OPEN ROTATOR CUFF REPAIR Left 08/25/2018   Procedure: SHOULDER ARTHROSCOPY WITH SUBCROMIAL DECOMPRESSION  DISTAL CLAVICLE EXCISION VS.OPEN ROTATOR CUFF REPAIR;  Surgeon: Leim Fabry, MD;  Location: [facility];  Service: Orthopedics;  Laterality: Left;   SHOULDER ARTHROSCOPY WITH SUBACROMIAL DECOMPRESSION AND OPEN ROTATOR C Right 08/28/2022   Procedure: Right shoulder arthroscopic rotator cuff repair, subacromial decompression, distal clavicle excision, and biceps tenodesis;  Surgeon: Leim Fabry, MD;  Location: [facility];  Service: Orthopedics;  Laterality: Right;   SUPRACERVICAL  ABDOMINAL HYSTERECTOMY N/A 2013   TEE WITHOUT CARDIOVERSION N/A 03/01/2022   Procedure: TRANSESOPHAGEAL ECHOCARDIOGRAM (TEE);  Surgeon: Vickie Epley, MD;  Location: [place] [facility];  Service: Cardiovascular;  Laterality: N/A;   TEMPOROMANDIBULAR JOINT ARTHROSCOPY Bilateral    1994   TUBAL LIGATION  1997   Family History  Problem Relation Age of Onset   Breast cancer Maternal Grandmother    Diabetes Maternal Grandmother    Dementia Maternal Grandmother    Atrial fibrillation Father    Hypertension Father    Congestive Heart Failure Father    Diabetes Paternal Grandmother    Heart attack Paternal Grandmother    Social History   Tobacco Use   Smoking status: Never   Smokeless tobacco: Never  Vaping Use   Vaping Use: Never used  Substance Use Topics   Alcohol use: Not Currently    Comment: occasional/ socially   Drug use: No    Pertinent Clinical Results:  LABS:   Hospital Outpatient Visit on 11/30/2022  Component Date Value Ref Range Status   Sodium 11/30/2022 139  135 - 145 mmol/L Final   Potassium 11/30/2022 3.7  3.5 - 5.1 mmol/L Final   Chloride 11/30/2022 104  98 - 111 mmol/L Final   CO2 11/30/2022 27  22 - 32 mmol/L Final   Glucose, Bld 11/30/2022 59 (L)  70 - 99 mg/dL Final   Glucose reference range applies only to samples taken after fasting for at least 8 hours.   BUN 11/30/2022 12  6 - 20 mg/dL Final   Creatinine, Ser 11/30/2022 0.52  0.44 - 1.00 mg/dL Final   Calcium 11/30/2022 9.0  8.9 - 10.3 mg/dL Final   GFR, Estimated 11/30/2022 >60  >60 mL/min Final   Comment: (NOTE) Calculated using the CKD-EPI Creatinine Equation (2021)    Anion gap 11/30/2022 8  5 - 15 Final   Performed at [facility], [address] [place], [place] [address]   WBC 11/30/2022 5.3  4.0 - 10.5 K/uL Final   RBC 11/30/2022 4.55  3.87 - 5.11 MIL/uL Final   Hemoglobin 11/30/2022 13.5  12.0 - 15.0 g/dL Final   HCT 11/30/2022 41.1  36.0 - 46.0 % Final   MCV  11/30/2022 90.3  80.0 - 100.0 fL Final   MCH 11/30/2022 29.7  26.0 - 34.0 pg Final   MCHC 11/30/2022 32.8  30.0 - 36.0 g/dL Final   RDW 11/30/2022 12.7  11.5 - 15.5 % Final   Platelets 11/30/2022 239  150 - 400 K/uL Final   nRBC 11/30/2022 0.0  0.0 - 0.2 % Final   Performed at [facility], [address] [place],  [address]    ECG: Date: 07/03/2022 Time ECG obtained: 2104 PM Rate: 64 bpm Rhythm: normal sinus Axis (leads I and aVF): Normal Intervals: PR 140 ms. QRS 72 ms. QTc 447 ms. ST segment and T wave changes: No evidence of acute ST  segment elevation or depression Comparison: Similar to previous tracing obtained on 03/02/2022  IMAGING / PROCEDURES: MR SHOULDER RIGHT WO CONTRAST performed on 11/20/2022 Surgical changes related to prior rotator cuff repair. Recurrent full-thickness, partial width retracted tear of the supraspinatus tendon. Long head biceps tenodesis Stable fatty atrophy of the teres minor muscle. Surgical changes related to prior bony decompressive surgery with distal clavicle resection and acromioplasty. No findings for bony impingement. Mild glenohumeral joint degenerative changes.   CT CHEST, ABDOMEN, PELVIS performed on 07/04/2022 Negative for acute pulmonary embolism. Mild cardiomegaly Skin thickening and subareolar mass in the left breast. Recommend further evaluation with mammography. Mild wall thickening in the jejunum may be due to decompression or mild enteritis. Otherwise no acute abnormality in the abdomen or pelvis. Aortic atherosclerosis   CT CARDIAC MORPH/PULM VEIN W/CM&W/O CA SCORE performed on 05/01/2022 There is normal pulmonary vein drainage into the left atrium. The left atrial appendage is successfully occluded with appropriate compression. Coronary calcium score of 13, which is 87th percentile for age, sex, and race matched control. 2 mm right solid pulmonary nodule within the upper lobe.    LONG TERM CARDIAC EVENT  MONITOR STUDY performed on 03/26/2022 HR 43 - 169bpm, average 70 bpm. 5 nonsustained VT, longest 18 beats 4 nonsustained SVT, longest 19 beats Rare supraventricular ectopy. Occasional ventricular ectopy, 2.1%. No sustained arrhythmias. Symptom triggered episodes correspond to sinus rhythm +/- PVC's.   TRANSESOPHAGEAL ECHOCARDIOGRAM performed on 03/01/2022 Interventional TEE for LAA-O.  Prior to procedure, patent left atrial appendage. mean diameter 1.7 cm with depth 1.9 cm. Small PFO noted.  Mid-mid transeptal puncture without incident.  Attempt of 24 mm Watchman FLX with compression < 10%. Device was recaptured.  Final deployment 27 mm Watchman FLX. Average diameter 22.1- 18% compressed. No peri-device leak.  No significant pericardial effusion. Left to right shunt post transeptal puncture.  Left ventricular ejection fraction, by estimation, is 35 to 40%. The left ventricle has normal function.  Right ventricular systolic function is normal. The right ventricular size is normal.  Left atrial size was mildly dilated. No left atrial/left atrial appendage thrombus was detected.  Right atrial size was mildly dilated.  The mitral valve is normal in structure. Trivial mitral valve regurgitation. No evidence of mitral stenosis.  The aortic valve is tricuspid. Aortic valve regurgitation is not visualized. No aortic stenosis is present.    TRANSTHORACIC ECHOCARDIOGRAM performed on 05/08/2021 Low normal left ventricular systolic function with an EF of 50% Trivial mitral and tricuspid valve regurgitation Normal gradients; no valvular stenosis No pericardial effusion   LEFT HEART CATHETERIZATION AND CORONARY ANGIOGRAPHY performed on 10/15/2017 Mild to moderate left ventricular systolic dysfunction with an EF of 35-45% Normal LVEDP  Normal coronaries with no evidence of significant epicardial coronary artery disease  Impression and Plan:  JALEI STAMLER has been referred for pre-anesthesia  review and clearance prior to her undergoing the planned anesthetic and procedural courses. Available labs, pertinent testing, and imaging results were personally reviewed by me in preparation for upcoming operative/procedural course. [facility] medical record has been updated following extensive record review and patient interview with PAT staff.   This patient has been appropriately cleared by cardiology with an overall LOW risk of significant perioperative cardiovascular complications. Based on clinical review performed today (11/30/22), barring any significant acute changes in the patient's overall condition, it is anticipated that she will be able to proceed with the planned surgical intervention. Any acute changes in clinical condition may necessitate her procedure being  postponed and/or cancelled. Patient will meet with anesthesia team (MD and/or CRNA) on the day of her procedure for preoperative evaluation/assessment. Questions regarding anesthetic course will be fielded at that time.   Pre-surgical instructions were reviewed with the patient during her PAT appointment, and questions were fielded to satisfaction by PAT clinical staff. She has been instructed on which medications that she will need to hold prior to surgery, as well as the ones that have been deemed safe/appropriate to take of the day of her procedure. As part of the general education provided by PAT, patient made aware both verbally and in writing, that she would need to abstain from the use of any illegal substances during her perioperative course.  She was advised that failure to follow the provided instructions could necessitate case cancellation or result serious perioperative complications up to and including death. Patient encouraged to contact PAT and/or her surgeon's office to discuss any questions or concerns that may arise prior to surgery; verbalized understanding.   Honor Loh, MSN, APRN, FNP-C, CEN [facility]  Peri-operative Services Nurse Practitioner Phone: [phone] Fax: [phone] 11/30/22 4:22 PM  NOTE: This note has been prepared using Dragon dictation software. Despite my best ability to proofread, there is always the potential that unintentional transcriptional errors may still occur from this process.

## 2022-12-03 ENCOUNTER — Ambulatory Visit: Payer: No Typology Code available for payment source | Admitting: Urgent Care

## 2022-12-03 ENCOUNTER — Other Ambulatory Visit: Payer: Self-pay

## 2022-12-03 ENCOUNTER — Ambulatory Visit
Admission: RE | Admit: 2022-12-03 | Discharge: 2022-12-03 | Disposition: A | Discharge status: Home / Self Care | Payer: No Typology Code available for payment source | Attending: Orthopedic Surgery | Admitting: Orthopedic Surgery

## 2022-12-03 ENCOUNTER — Encounter
Admission: RE | Disposition: A | Discharge status: Home / Self Care | Payer: Self-pay | Source: Home / Self Care | Attending: Orthopedic Surgery

## 2022-12-03 ENCOUNTER — Encounter: Payer: Self-pay | Admitting: Orthopedic Surgery

## 2022-12-03 ENCOUNTER — Ambulatory Visit: Payer: No Typology Code available for payment source

## 2022-12-03 DIAGNOSIS — M75121 Complete rotator cuff tear or rupture of right shoulder, not specified as traumatic: Secondary | ICD-10-CM | POA: Diagnosis present

## 2022-12-03 DIAGNOSIS — I509 Heart failure, unspecified: Secondary | ICD-10-CM | POA: Insufficient documentation

## 2022-12-03 DIAGNOSIS — M65811 Other synovitis and tenosynovitis, right shoulder: Secondary | ICD-10-CM | POA: Insufficient documentation

## 2022-12-03 DIAGNOSIS — I700 Atherosclerosis of aorta: Secondary | ICD-10-CM | POA: Diagnosis not present

## 2022-12-03 DIAGNOSIS — G4733 Obstructive sleep apnea (adult) (pediatric): Secondary | ICD-10-CM | POA: Diagnosis not present

## 2022-12-03 DIAGNOSIS — Z79899 Other long term (current) drug therapy: Secondary | ICD-10-CM | POA: Insufficient documentation

## 2022-12-03 DIAGNOSIS — Z6828 Body mass index (BMI) 28.0-28.9, adult: Secondary | ICD-10-CM | POA: Insufficient documentation

## 2022-12-03 DIAGNOSIS — I4891 Unspecified atrial fibrillation: Secondary | ICD-10-CM | POA: Diagnosis not present

## 2022-12-03 DIAGNOSIS — M1990 Unspecified osteoarthritis, unspecified site: Secondary | ICD-10-CM | POA: Insufficient documentation

## 2022-12-03 DIAGNOSIS — I2510 Atherosclerotic heart disease of native coronary artery without angina pectoris: Secondary | ICD-10-CM | POA: Insufficient documentation

## 2022-12-03 DIAGNOSIS — Z9181 History of falling: Secondary | ICD-10-CM | POA: Diagnosis not present

## 2022-12-03 DIAGNOSIS — Z7902 Long term (current) use of antithrombotics/antiplatelets: Secondary | ICD-10-CM | POA: Insufficient documentation

## 2022-12-03 DIAGNOSIS — G43909 Migraine, unspecified, not intractable, without status migrainosus: Secondary | ICD-10-CM | POA: Diagnosis not present

## 2022-12-03 DIAGNOSIS — Z9884 Bariatric surgery status: Secondary | ICD-10-CM | POA: Insufficient documentation

## 2022-12-03 DIAGNOSIS — Z8673 Personal history of transient ischemic attack (TIA), and cerebral infarction without residual deficits: Secondary | ICD-10-CM | POA: Diagnosis not present

## 2022-12-03 DIAGNOSIS — I110 Hypertensive heart disease with heart failure: Secondary | ICD-10-CM | POA: Diagnosis not present

## 2022-12-03 DIAGNOSIS — E785 Hyperlipidemia, unspecified: Secondary | ICD-10-CM | POA: Diagnosis not present

## 2022-12-03 DIAGNOSIS — I428 Other cardiomyopathies: Secondary | ICD-10-CM | POA: Diagnosis not present

## 2022-12-03 DIAGNOSIS — E669 Obesity, unspecified: Secondary | ICD-10-CM | POA: Diagnosis not present

## 2022-12-03 DIAGNOSIS — E538 Deficiency of other specified B group vitamins: Secondary | ICD-10-CM | POA: Diagnosis not present

## 2022-12-03 HISTORY — PX: SHOULDER ARTHROSCOPY WITH OPEN ROTATOR CUFF REPAIR: SHX6092

## 2022-12-03 SURGERY — ARTHROSCOPY, SHOULDER WITH REPAIR, ROTATOR CUFF, OPEN
Anesthesia: Regional | Site: Shoulder | Laterality: Right

## 2022-12-03 MED ORDER — DEXAMETHASONE SODIUM PHOSPHATE 10 MG/ML IJ SOLN
INTRAMUSCULAR | Status: DC | PRN
  Administered 2022-12-03: 10 mg via INTRAVENOUS

## 2022-12-03 MED ORDER — BUPIVACAINE HCL (PF) 0.5 % IJ SOLN
INTRAMUSCULAR | Status: DC | PRN
  Administered 2022-12-03: 10 mL

## 2022-12-03 MED ORDER — ONDANSETRON 4 MG PO TBDP: 4.0000 mg | ORAL_TABLET | Freq: Three times a day (TID) | ORAL | 0 refills | Status: DC | PRN

## 2022-12-03 MED ORDER — EPINEPHRINE PF 1 MG/ML IJ SOLN
INTRAMUSCULAR | Status: AC
  Filled 2022-12-03: qty 4

## 2022-12-03 MED ORDER — OXYCODONE HCL 5 MG PO TABS: 5.0000 mg | ORAL_TABLET | ORAL | 0 refills | Status: DC | PRN

## 2022-12-03 MED ORDER — ASPIRIN 325 MG PO TBEC: 325.0000 mg | DELAYED_RELEASE_TABLET | Freq: Every day | ORAL | 0 refills | Status: AC

## 2022-12-03 MED ORDER — ACETAMINOPHEN 10 MG/ML IV SOLN
INTRAVENOUS | Status: AC
  Filled 2022-12-03: qty 100

## 2022-12-03 MED ORDER — PHENYLEPHRINE HCL-NACL 20-0.9 MG/250ML-% IV SOLN
INTRAVENOUS | Status: DC | PRN
  Administered 2022-12-03: 25 ug/min via INTRAVENOUS

## 2022-12-03 MED ORDER — BUPIVACAINE LIPOSOME 1.3 % IJ SUSP
INTRAMUSCULAR | Status: AC
  Filled 2022-12-03: qty 10

## 2022-12-03 MED ORDER — EPHEDRINE SULFATE (PRESSORS) 50 MG/ML IJ SOLN
INTRAMUSCULAR | Status: DC | PRN
  Administered 2022-12-03 (×2): 5 mg via INTRAVENOUS

## 2022-12-03 MED ORDER — CHLORHEXIDINE GLUCONATE 0.12 % MT SOLN
OROMUCOSAL | Status: AC
  Administered 2022-12-03: 15 mL via OROMUCOSAL
  Filled 2022-12-03: qty 15

## 2022-12-03 MED ORDER — CEFAZOLIN SODIUM-DEXTROSE 2-4 GM/100ML-% IV SOLN
INTRAVENOUS | Status: AC
  Filled 2022-12-03: qty 100

## 2022-12-03 MED ORDER — 0.9 % SODIUM CHLORIDE (POUR BTL) OPTIME
TOPICAL | Status: DC | PRN
  Administered 2022-12-03: 500 mL

## 2022-12-03 MED ORDER — SUGAMMADEX SODIUM 200 MG/2ML IV SOLN
INTRAVENOUS | Status: DC | PRN
  Administered 2022-12-03: 200 mg via INTRAVENOUS

## 2022-12-03 MED ORDER — ORAL CARE MOUTH RINSE: 15.0000 mL | Freq: Once | OROMUCOSAL | Status: AC

## 2022-12-03 MED ORDER — OXYCODONE HCL 5 MG/5ML PO SOLN: 5.0000 mg | Freq: Once | ORAL | Status: DC | PRN

## 2022-12-03 MED ORDER — PHENYLEPHRINE 80 MCG/ML (10ML) SYRINGE FOR IV PUSH (FOR BLOOD PRESSURE SUPPORT)
PREFILLED_SYRINGE | INTRAVENOUS | Status: DC | PRN
  Administered 2022-12-03 (×4): 160 ug via INTRAVENOUS

## 2022-12-03 MED ORDER — ROCURONIUM BROMIDE 100 MG/10ML IV SOLN
INTRAVENOUS | Status: DC | PRN
  Administered 2022-12-03: 60 mg via INTRAVENOUS
  Administered 2022-12-03: 20 mg via INTRAVENOUS

## 2022-12-03 MED ORDER — FENTANYL CITRATE (PF) 100 MCG/2ML IJ SOLN: 25.0000 ug | INTRAMUSCULAR | Status: DC | PRN

## 2022-12-03 MED ORDER — CHLORHEXIDINE GLUCONATE 0.12 % MT SOLN: 15.0000 mL | Freq: Once | OROMUCOSAL | Status: AC

## 2022-12-03 MED ORDER — MIDAZOLAM HCL 2 MG/2ML IJ SOLN
INTRAMUSCULAR | Status: AC
  Filled 2022-12-03: qty 2

## 2022-12-03 MED ORDER — ACETAMINOPHEN 500 MG PO TABS: 1000.0000 mg | ORAL_TABLET | Freq: Three times a day (TID) | ORAL | 2 refills | Status: DC

## 2022-12-03 MED ORDER — ONDANSETRON HCL 4 MG/2ML IJ SOLN
INTRAMUSCULAR | Status: DC | PRN
  Administered 2022-12-03 (×2): 4 mg via INTRAVENOUS

## 2022-12-03 MED ORDER — MIDAZOLAM HCL 2 MG/2ML IJ SOLN
INTRAMUSCULAR | Status: AC
  Administered 2022-12-03: 1 mg via INTRAVENOUS
  Filled 2022-12-03: qty 2

## 2022-12-03 MED ORDER — PROPOFOL 10 MG/ML IV BOLUS
INTRAVENOUS | Status: DC | PRN
  Administered 2022-12-03: 200 mg via INTRAVENOUS

## 2022-12-03 MED ORDER — ACETAMINOPHEN 10 MG/ML IV SOLN
INTRAVENOUS | Status: DC | PRN
  Administered 2022-12-03: 1000 mg via INTRAVENOUS

## 2022-12-03 MED ORDER — LIDOCAINE HCL (PF) 1 % IJ SOLN
INTRAMUSCULAR | Status: DC | PRN
  Administered 2022-12-03: 3 mL via INTRADERMAL

## 2022-12-03 MED ORDER — FENTANYL CITRATE (PF) 100 MCG/2ML IJ SOLN
INTRAMUSCULAR | Status: DC | PRN
  Administered 2022-12-03: 50 ug via INTRAVENOUS

## 2022-12-03 MED ORDER — BUPIVACAINE HCL (PF) 0.5 % IJ SOLN
INTRAMUSCULAR | Status: AC
  Filled 2022-12-03: qty 10

## 2022-12-03 MED ORDER — LIDOCAINE HCL (PF) 1 % IJ SOLN
INTRAMUSCULAR | Status: AC
  Filled 2022-12-03: qty 5

## 2022-12-03 MED ORDER — BUPIVACAINE LIPOSOME 1.3 % IJ SUSP
INTRAMUSCULAR | Status: DC | PRN
  Administered 2022-12-03: 10 mg

## 2022-12-03 MED ORDER — LACTATED RINGERS IV SOLN: INTRAVENOUS | Status: DC

## 2022-12-03 MED ORDER — FENTANYL CITRATE (PF) 100 MCG/2ML IJ SOLN
INTRAMUSCULAR | Status: AC
  Filled 2022-12-03: qty 2

## 2022-12-03 MED ORDER — MIDAZOLAM HCL 2 MG/2ML IJ SOLN
1.0000 mg | INTRAMUSCULAR | Status: AC | PRN
  Administered 2022-12-03: 1 mg via INTRAVENOUS

## 2022-12-03 MED ORDER — LACTATED RINGERS IV SOLN
INTRAVENOUS | Status: DC | PRN
  Administered 2022-12-03 (×4): 3001 mL

## 2022-12-03 MED ORDER — OXYCODONE HCL 5 MG PO TABS: 5.0000 mg | ORAL_TABLET | Freq: Once | ORAL | Status: DC | PRN

## 2022-12-03 MED ORDER — CEFAZOLIN SODIUM-DEXTROSE 2-4 GM/100ML-% IV SOLN
2.0000 g | INTRAVENOUS | Status: AC
  Administered 2022-12-03: 2 g via INTRAVENOUS

## 2022-12-03 SURGICAL SUPPLY — 82 items
ADAPTER IRRIG TUBE 2 SPIKE SOL (ADAPTER) ×2 IMPLANT
ANCHOR BONE REGENETEN (Anchor) IMPLANT
ANCHOR SUT BIO SW 4.75X19.1 (Anchor) IMPLANT
ANCHOR SUT KL SWIVELCK5.5X19.1 (Orthopedic Implant) IMPLANT
ANCHOR TENDON REGENETEN (Staple) IMPLANT
ANCHR SUT KL SWIVELCK 5.5X19.1 (Orthopedic Implant) ×2 IMPLANT
BLADE OSCILLATING/SAGITTAL (BLADE)
BLADE SHAVER 4.5X7 STR FR (MISCELLANEOUS) ×1 IMPLANT
BLADE SW THK.38XMED LNG THN (BLADE) IMPLANT
BNDG ADH 2 X3.75 FABRIC TAN LF (GAUZE/BANDAGES/DRESSINGS) ×1 IMPLANT
BUR BR 5.5 WIDE MOUTH (BURR) IMPLANT
CANNULA PART THRD DISP 5.75X7 (CANNULA) IMPLANT
CANNULA PARTIAL THREAD 2X7 (CANNULA) ×1 IMPLANT
CANNULA TWIST IN 8.25X9CM (CANNULA) IMPLANT
CHLORAPREP W/TINT 26 (MISCELLANEOUS) ×1 IMPLANT
COOLER POLAR GLACIER W/PUMP (MISCELLANEOUS) ×1 IMPLANT
COVER LIGHT HANDLE STERIS (MISCELLANEOUS) ×1 IMPLANT
DERMABOND ADVANCED .7 DNX12 (GAUZE/BANDAGES/DRESSINGS) IMPLANT
DEVICE SUCT BLK HOLE OR FLOOR (MISCELLANEOUS) IMPLANT
DRAPE INCISE IOBAN 66X45 STRL (DRAPES) ×1 IMPLANT
DRAPE U-SHAPE 47X51 STRL (DRAPES) ×2 IMPLANT
ELECT REM PT RETURN 9FT ADLT (ELECTROSURGICAL) ×1
ELECTRODE REM PT RTRN 9FT ADLT (ELECTROSURGICAL) ×1 IMPLANT
GAUZE SPONGE 4X4 12PLY STRL (GAUZE/BANDAGES/DRESSINGS) ×1 IMPLANT
GAUZE XEROFORM 1X8 LF (GAUZE/BANDAGES/DRESSINGS) ×1 IMPLANT
GLOVE BIO SURGEON STRL SZ7.5 (GLOVE) ×2 IMPLANT
GLOVE BIOGEL PI IND STRL 8 (GLOVE) ×2 IMPLANT
GLOVE SURG SYN 8.0 (GLOVE) ×2 IMPLANT
GLOVE SURG SYN 8.0 PF PI (GLOVE) ×2 IMPLANT
GOWN STRL REUS W/ TWL LRG LVL3 (GOWN DISPOSABLE) ×2 IMPLANT
GOWN STRL REUS W/TWL LRG LVL3 (GOWN DISPOSABLE) ×2
GOWN STRL REUS W/TWL XL LVL4 (GOWN DISPOSABLE) ×1 IMPLANT
IMPL REGENETEN MEDIUM (Shoulder) IMPLANT
IMPLANT REGENETEN MEDIUM (Shoulder) ×1 IMPLANT
IV LACTATED RINGER IRRG 3000ML (IV SOLUTION) ×4
IV LR IRRIG 3000ML ARTHROMATIC (IV SOLUTION) ×4 IMPLANT
KIT STABILIZATION SHOULDER (MISCELLANEOUS) ×1 IMPLANT
KIT TURNOVER KIT A (KITS) ×1 IMPLANT
MANIFOLD NEPTUNE II (INSTRUMENTS) ×2 IMPLANT
MASK FACE SPIDER DISP (MASK) ×1 IMPLANT
MAT ABSORB  FLUID 56X50 GRAY (MISCELLANEOUS) ×2
MAT ABSORB FLUID 56X50 GRAY (MISCELLANEOUS) ×2 IMPLANT
NDL MAYO 6 CRC TAPER PT (NEEDLE) IMPLANT
NDL SCORPION MULTI FIRE (NEEDLE) IMPLANT
NEEDLE HYPO 22GX1.5 SAFETY (NEEDLE) ×1 IMPLANT
NEEDLE MAYO 6 CRC TAPER PT (NEEDLE) IMPLANT
NEEDLE SCORPION MULTI FIRE (NEEDLE) IMPLANT
PACK ARTHROSCOPY SHOULDER (MISCELLANEOUS) ×1 IMPLANT
PAD ABD DERMACEA PRESS 5X9 (GAUZE/BANDAGES/DRESSINGS) ×1 IMPLANT
PAD ARMBOARD 7.5X6 YLW CONV (MISCELLANEOUS) ×2 IMPLANT
PAD WRAPON POLAR SHDR XLG (MISCELLANEOUS) ×1 IMPLANT
PASSER SUT FIRSTPASS SELF (INSTRUMENTS) ×1 IMPLANT
SHAVER BLADE BONE CUTTER  5.5 (BLADE)
SHAVER BLADE BONE CUTTER 5.5 (BLADE) IMPLANT
SLEEVE REMOTE CONTROL 5X12 (DRAPES) IMPLANT
SLING ULTRA II M (MISCELLANEOUS) ×1 IMPLANT
SPONGE T-LAP 18X18 ~~LOC~~+RFID (SPONGE) ×1 IMPLANT
STRAP SAFETY 5IN WIDE (MISCELLANEOUS) ×1 IMPLANT
SUT ETHILON 3-0 (SUTURE) ×1 IMPLANT
SUT LASSO 90 DEG SD STR (SUTURE) IMPLANT
SUT MNCRL 4-0 (SUTURE)
SUT MNCRL 4-0 27XMFL (SUTURE)
SUT PDS PLUS 0 (SUTURE) ×1
SUT PDS PLUS AB 0 CT-2 (SUTURE) IMPLANT
SUT PROLENE 0 CT 2 (SUTURE) IMPLANT
SUT TICRON 2-0 30IN 311381 (SUTURE) IMPLANT
SUT VIC AB 0 CT1 36 (SUTURE) IMPLANT
SUT VIC AB 2-0 CT2 27 (SUTURE) IMPLANT
SUT VIC AB 4-0 PS2 27 (SUTURE) IMPLANT
SUT XBRAID 1.4 BLK/WHT (SUTURE) IMPLANT
SUT XBRAID 1.4 BLUE (SUTURE) IMPLANT
SUTURE MNCRL 4-0 27XMF (SUTURE) IMPLANT
SUTURE TAPE 1.3 40 TPR END (SUTURE) IMPLANT
SUTURETAPE 1.3 40 TPR END (SUTURE) ×2
SYR 10ML LL (SYRINGE) IMPLANT
TAPE MICROFOAM 4IN (TAPE) ×1 IMPLANT
TRAP FLUID SMOKE EVACUATOR (MISCELLANEOUS) ×1 IMPLANT
TUBE SET DOUBLEFLO INFLOW (TUBING) ×1 IMPLANT
TUBING OUTFLOW SET DBLFO PUMP (TUBING) ×1 IMPLANT
WAND WEREWOLF FLOW 90D (MISCELLANEOUS) ×1 IMPLANT
WATER STERILE IRR 500ML POUR (IV SOLUTION) ×1 IMPLANT
WRAPON POLAR PAD SHDR XLG (MISCELLANEOUS) ×1

## 2022-12-03 NOTE — Transfer of Care (Signed)
Immediate Anesthesia Transfer of Care Note  Patient: Michelle Norris  Procedure(s) Performed: Right shoulder revision mini open rotator cuff with Regeneten patch augmentation (Right: Shoulder)  Patient Location: PACU  Anesthesia Type:General  Level of Consciousness: awake, drowsy, and patient cooperative  Airway & Oxygen Therapy: Patient Spontanous Breathing and Patient connected to face mask oxygen  Post-op Assessment: Report given to RN and Post -op Vital signs reviewed and stable  Post vital signs: Reviewed and stable  Last Vitals:  Vitals Value Taken Time  BP 97/60 12/03/22 1153  Temp    Pulse 67 12/03/22 1158  Resp 14 12/03/22 1158  SpO2 100 % 12/03/22 1158  Vitals shown include unvalidated device data.  Last Pain:  Vitals:   12/03/22 0732  TempSrc: Oral  PainSc: 7          Complications: No notable events documented.

## 2022-12-03 NOTE — Anesthesia Postprocedure Evaluation (Signed)
Anesthesia Post Note  Patient: Michelle Norris  Procedure(s) Performed: Right shoulder revision mini open rotator cuff with Regeneten patch augmentation (Right: Shoulder)  Patient location during evaluation: PACU Anesthesia Type: Regional Level of consciousness: awake and alert Pain management: pain level controlled Vital Signs Assessment: post-procedure vital signs reviewed and stable Respiratory status: spontaneous breathing, nonlabored ventilation, respiratory function stable and patient connected to nasal cannula oxygen Cardiovascular status: blood pressure returned to baseline and stable Postop Assessment: no apparent nausea or vomiting Anesthetic complications: no   No notable events documented.   Last Vitals:  Vitals:   12/03/22 1159 12/03/22 1200  BP: 97/60 102/62  Pulse: 70 66  Resp: 14 13  Temp: (!) 36.3 C   SpO2: 100% 100%    Last Pain:  Vitals:   12/03/22 1200  TempSrc:   PainSc: 0-No pain                 Ilene Qua

## 2022-12-03 NOTE — Discharge Instructions (Addendum)
Post-Op Instructions - Rotator Cuff Repair  1. Bracing: You will wear a shoulder immobilizer or sling for 6 weeks.   2. Driving: No driving for 3 weeks post-op. When driving, do not wear the immobilizer. Ideally, we recommend no driving for 6 weeks while sling is in place as one arm will be immobilized.   3. Activity: No active lifting for 2 months. Wrist, hand, and elbow motion only. Avoid lifting the upper arm away from the body except for hygiene. You are permitted to bend and straighten the elbow passively only (no active elbow motion). You may use your hand and wrist for typing, writing, and managing utensils (cutting food). Do not lift more than a coffee cup for 8 weeks.  When sleeping or resting, inclined positions (recliner chair or wedge pillow) and a pillow under the forearm for support may provide better comfort for up to 4 weeks.  Avoid long distance travel for 4 weeks.  Return to normal activities after rotator cuff repair repair normally takes 6 months on average. If rehab goes very well, may be able to do most activities at 4 months, except overhead or contact sports.  4. Physical Therapy: Begins 3-4 days after surgery, and proceed 1 time per week for the first 6 weeks, then 1-2 times per week from weeks 6-20 post-op.  5. Medications:  - You will be provided a prescription for narcotic pain medicine. After surgery, take 1-2 narcotic tablets every 4 hours if needed for severe pain.  - A prescription for anti-nausea medication will be provided in case the narcotic medicine causes nausea - take 1 tablet every 6 hours only if nauseated.   - Take tylenol 1000 mg (2 Extra Strength tablets or 3 regular strength) every 8 hours for pain.  May decrease or stop tylenol 5 days after surgery if you are having minimal pain. - Take ASA 325mg/day x 2 weeks to help prevent DVTs/PEs (blood clots).  - DO NOT take ANY nonsteroidal anti-inflammatory pain medications (Advil, Motrin, Ibuprofen, Aleve,  Naproxen, or Naprosyn). These medicines can inhibit healing of your shoulder repair.    If you are taking prescription medication for anxiety, depression, insomnia, muscle spasm, chronic pain, or for attention deficit disorder, you are advised that you are at a higher risk of adverse effects with use of narcotics post-op, including narcotic addiction/dependence, depressed breathing, death. If you use non-prescribed substances: alcohol, marijuana, cocaine, heroin, methamphetamines, etc., you are at a higher risk of adverse effects with use of narcotics post-op, including narcotic addiction/dependence, depressed breathing, death. You are advised that taking > 50 morphine milligram equivalents (MME) of narcotic pain medication per day results in twice the risk of overdose or death. For your prescription provided: oxycodone 5 mg - taking more than 6 tablets per day would result in > 50 morphine milligram equivalents (MME) of narcotic pain medication. Be advised that we will prescribe narcotics short-term, for acute post-operative pain only - 3 weeks for major operations such as shoulder repair/reconstruction surgeries.     6. Post-Op Appointment:  Your first post-op appointment will be 10-14 days post-op.  7. Work or School: For most, but not all procedures, we advise staying out of work or school for at least 1 to 2 weeks in order to recover from the stress of surgery and to allow time for healing.   If you need a work or school note this can be provided.   8. Smoking: If you are a smoker, you need to refrain from   smoking in the postoperative period. The nicotine in cigarettes will inhibit healing of your shoulder repair and decrease the chance of successful repair. Similarly, nicotine containing products (gum, patches) should be avoided.   Post-operative Brace: Apply and remove the brace you received as you were instructed to at the time of fitting and as described in detail as the brace's  instructions for use indicate.  Wear the brace for the period of time prescribed by your physician.  The brace can be cleaned with soap and water and allowed to air dry only.  Should the brace result in increased pain, decreased feeling (numbness/tingling), increased swelling or an overall worsening of your medical condition, please contact your doctor immediately.  If an emergency situation occurs as a result of wearing the brace after normal business hours, please dial 911 and seek immediate medical attention.  Let your doctor know if you have any further questions about the brace issued to you. Refer to the shoulder sling instructions for use if you have any questions regarding the correct fit of your shoulder sling.  [facility] for Troubleshooting: [phone]  Video that illustrates how to properly use a shoulder sling: "Instructions for Proper Use of an Orthopaedic Sling" [email or web address].hu  SHOULDER SLING IMMOBILIZER   VIDEO Slingshot 2 Shoulder Brace Application - YouTube ---[email or web address].[email or web address]-schwartz.biz/  INSTRUCTIONS While supporting the injured arm, slide the forearm into the sling. Wrap the adjustable shoulder strap around the neck and shoulders and attach the strap end to the sling using  the "alligator strap tab."  Adjust the shoulder strap to the required length. Position the shoulder pad behind the neck. To secure the shoulder pad location (optional), pull the shoulder strap away from the shoulder pad, unfold the hook material on the top of the pad, then press the shoulder strap back onto the hook material to secure the pad in place. Attach the closure strap across the open top of the sling. Position the strap so that it holds the arm securely in the sling. Next, attach the thumb strap to the open end of the sling between the thumb and fingers. After sling has been fit, it may be easily removed and reapplied using the quick release  buckle on shoulder strap. If a neutral pillow or 15 abduction pillow is included, place the pillow at the waistline. Attach the sling to the pillow, lining up hook material on the pillow with the loop on sling. Adjust the waist strap to fit.  If waist strap is too long, cut it to fit. Use the small piece of double sided hook material (located on top of the pillow) to secure the strap end. Place the double sided hook material on the inside of the cut strap end and secure it to the waist strap.     If no pillow is included, attach the waist strap to the sling and adjust to fit.    Washing Instructions: Straps and sling must be removed and cleaned regularly depending on your activity level and perspiration. Hand wash straps and sling in cold water with mild detergent, rinse, air dry   POLAR CARE INFORMATION  http://jones.com/  How to use [place]?  YouTube   [email or web address].tn  OPERATING INSTRUCTIONS  Start the product With dry hands, connect the transformer to the electrical connection located on the top of the cooler. Next, plug the transformer into an appropriate electrical outlet. The unit will automatically start running at this point.  To stop the pump,  disconnect electrical power.  Unplug to stop the product when not in use. Unplugging the Polar Care unit turns it off. Always unplug immediately after use. Never leave it plugged in while unattended. Remove pad.    FIRST ADD WATER TO FILL LINE, THEN ICE---Replace ice when existing ice is almost melted  1 Discuss Treatment with your [facility] Practitioner and Use Only as Prescribed 2 Apply Insulation Barrier & Cold Therapy Pad 3 Check for Moisture 4 Inspect Skin Regularly  Tips and Trouble Shooting Usage Tips 1. Use cubed or chunked ice for optimal performance. 2. It is recommended to drain the Pad between uses. To drain the pad, hold the Pad upright with the hose  pointed toward the ground. Depress the black plunger and allow water to drain out. 3. You may disconnect the Pad from the unit without removing the pad from the affected area by depressing the silver tabs on the hose coupling and gently pulling the hoses apart. The Pad and unit will seal itself and will not leak. Note: Some dripping during release is normal. 4. DO NOT RUN PUMP WITHOUT WATER! The pump in this unit is designed to run with water. Running the unit without water will cause permanent damage to the pump. 5. Unplug unit before removing lid.  TROUBLESHOOTING GUIDE Pump not running, Water not flowing to the pad, Pad is not getting cold 1. Make sure the transformer is plugged into the wall outlet. 2. Confirm that the ice and water are filled to the indicated levels. 3. Make sure there are no kinks in the pad. 4. Gently pull on the blue tube to make sure the tube/pad junction is straight. 5. Remove the pad from the treatment site and ll it while the pad is lying at; then reapply. 6. Confirm that the pad couplings are securely attached to the unit. Listen for the double clicks (Figure 1) to confirm the pad couplings are securely attached.  Leaks    Note: Some condensation on the lines, controller, and pads is unavoidable, especially in warmer climates. 1. If using a [place] Polar Care Cold Therapy unit with a detachable Cold Therapy Pad, and a leak exists (other than condensation on the lines) disconnect the pad couplings. Make sure the silver tabs on the couplings are depressed before reconnecting the pad to the pump hose; then confirm both sides of the coupling are properly clicked in. 2. If the coupling continues to leak or a leak is detected in the pad itself, stop using it and call [facility] at [phone].  Cleaning After use, empty and dry the unit with a soft cloth. Warm water and mild detergent may be used occasionally to clean the pump and tubes.  WARNING: The [place] can be cold enough to cause serious injury, including full skin necrosis. Follow these Operating Instructions, and carefully read the Product Insert (see pouch on side of unit) and the Cold Therapy Pad Fitting Instructions (provided with each Cold Therapy Pad) prior to use.          AMBULATORY SURGERY  DISCHARGE INSTRUCTIONS   The drugs that you were given will stay in your system until tomorrow so for the next 24 hours you should not:  Drive an automobile Make any legal decisions Drink any alcoholic beverage   You may resume regular meals tomorrow.  Today it is better to start with liquids and gradually work up to solid foods.  You may eat anything you prefer,  but it is better to start with liquids, then soup and crackers, and gradually work up to solid foods.   Please notify your doctor immediately if you have any unusual bleeding, trouble breathing, redness and pain at the surgery site, drainage, fever, or pain not relieved by medication.     Your post-operative visit with Dr.                                       is: Date:                        Time:    Please call to schedule your post-operative visit.  Additional Instructions:

## 2022-12-03 NOTE — Anesthesia Procedure Notes (Signed)

## 2022-12-03 NOTE — Anesthesia Preprocedure Evaluation (Signed)
Anesthesia Evaluation  Patient identified by MRN, date of birth, ID band Patient awake    Reviewed: Allergy & Precautions, NPO status , Patient's Chart, lab work & pertinent test results  History of Anesthesia Complications Negative for: history of anesthetic complications  Airway Mallampati: III  TM Distance: >3 FB Neck ROM: full    Dental  (+) Chipped, Dental Advidsory Given, Poor Dentition   Pulmonary neg pulmonary ROS, sleep apnea and Oxygen sleep apnea , neg COPD   Pulmonary exam normal        Cardiovascular hypertension, On Medications and On Home Beta Blockers + CAD and +CHF  negative cardio ROS Normal cardiovascular exam+ dysrhythmias (s/p watchman) Atrial Fibrillation and Supra Ventricular Tachycardia   03/01/2022 -- Echocardiogram (CE): No significant pericardial effusion. Left to right shunt post transeptal puncture. Left ventricular ejection fraction, by estimation, is 35 to 40%. The left ventricle has normal function. Right ventricular systolic function is normal. The right ventricular size is normal. Left atrial size was mildly dilated. No left atrial/left atrial appendage thrombus was detected. Right atrial size was mildly dilated. The mitral valve is normal in structure. Trivial mitral valve regurgitation. No evidence of mitral stenosis. The aortic valve is tricuspid. Aortic valve regurgitation is not visualized. No aortic stenosis is present.   03/27/22 interrogation --  HR 43 - 169bpm, average 70 bpm. 5 nonsustained VT, longest 18 beats 4 nonsustained SVT, longest 19 beats Rare supraventricular ectopy. Occasional ventricular ectopy, 2.1%. No sustained arrhythmias. Symptom triggered episodes correspond to sinus rhythm +/- PVC's.     Neuro/Psych  Neuromuscular disease CVA (frequent falls), Residual Symptoms negative neurological ROS  negative psych ROS   GI/Hepatic negative GI ROS, Neg liver ROS,,,  Endo/Other   negative endocrine ROS    Renal/GU      Musculoskeletal   Abdominal   Peds  Hematology negative hematology ROS (+) Plavix    Anesthesia Other Findings Past Medical History: No date: Aortic atherosclerosis (HCC) No date: Atrial fibrillation ([place])     Comment:  a.) CHA2DS2VASc = 6 (sex, CHF, HTN, CVA x2, vascular               disease history);  b.) s/p DCCV (150 J x1) 12/02/2017;               c.) s/p Watchman device placement 03/01/2022; d.)               rate/rhythm maintained now without pharmacological               intervention; chronically anticoagulated with clopidogrel 08/03/2021: Atypical mole     Comment:  L sacral, moderate atypia No date: Bradycardia No date: CAD (coronary artery disease)     Comment:  a.) LHC 10/15/2017: EF 35-45%, LVEDP norm, normal               coronaries; b.) cCTA 08/10/2021: EF 67%. 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 No date: Hyperlipidemia No date: Hypertension 07/2018: Leukocytosis  Comment:  being monitored by pcp No date: Long term current use of antithrombotics/antiplatelets     Comment:  a.) clopidogrel No date: Migraines No date: NICM (nonischemic cardiomyopathy) ([place])     Comment:  a.) TTE 09/17/2017: EF 30%; b.) MPI 09/20/2017: EF               45-50%; c.) LHC 10/15/2017: EF 35-45%; d.) TTE               03/23/2020: EF 45%; e.) TTE 05/08/2021: EF 50%; f.) cCTA               08/10/2021: EF 67% 03/26/2022: NSVT (nonsustained ventricular tachycardia) ([place])     Comment:  a.) Holter 03/26/2022 - 5 runs of NSVT with the longest               lasting 18 beats No date: Obesity No date: OSA  (obstructive sleep apnea)     Comment:  a.) no nocturnal PAP therapy; has Inspire implant No date: Osteoarthritis No date: Peripheral edema 03/01/2022: Presence of Watchman left atrial appendage closure device     Comment:  a.) s/p Watchman FLX 27 mm device placement 03/26/2022: PSVT (paroxysmal supraventricular tachycardia)     Comment:  a.) Holter 03/26/2022 - 4 runs of SVT with the longest               lasting 19 beats 06/2018: [facility] spotted fever     Comment:  bulls-eye rash on back No date: Vitamin B12 deficiency  Past Surgical History: 08/25/2018: BICEPT TENODESIS; Left     Comment:  Procedure: BICEPS TENODESIS;  Surgeon: Leim Fabry, MD;              Location: [facility] ORS;  Service: Orthopedics;  Laterality:               Left; 12/02/2017: CARDIOVERSION; N/A     Comment:  Procedure: CARDIOVERSION;  Surgeon: Yolonda Kida,               MD;  Location: [facility] ORS;  Service: Cardiovascular;                Laterality: N/A; 03/01/2022: LEFT ATRIAL APPENDAGE OCCLUSION; N/A     Comment:  Procedure: LEFT ATRIAL APPENDAGE OCCLUSION;  Surgeon:               Vickie Epley, MD;  Location: [facility];                Service: Cardiovascular;  Laterality: N/A; 10/15/2017: LEFT HEART CATH AND CORONARY ANGIOGRAPHY; Left     Comment:  Procedure: LEFT HEART CATH AND CORONARY ANGIOGRAPHY;                Surgeon: Yolonda Kida, MD;  Location: [place]              CV LAB;  Service: Cardiovascular;  Laterality: Left; 10/15/2017: LEFT HEART CATH AND CORONARY ANGIOGRAPHY; Left     Comment:  Procedure: LEFT HEART CATH AND CORONARY ANGIOGRAPHY;               Location: [facility]; Surgeon: Katrine Coho, MD 2004: ORIF [place]; Right     Comment:  fall on right ; ANKLE FRACTURE 08/25/2018: SHOULDER ARTHROSCOPY WITH OPEN ROTATOR CUFF REPAIR; Left     Comment:  Procedure: SHOULDER ARTHROSCOPY WITH SUBCROMIAL               DECOMPRESSION  DISTAL CLAVICLE  EXCISION VS.OPEN ROTATOR               CUFF REPAIR;  Surgeon: Leim Fabry, MD;  Location: [facility]               ORS;  Service: Orthopedics;  Laterality: Left; 2013: SUPRACERVICAL ABDOMINAL HYSTERECTOMY; N/A 03/01/2022: TEE WITHOUT CARDIOVERSION; N/A     Comment:  Procedure: TRANSESOPHAGEAL ECHOCARDIOGRAM (TEE);                Surgeon: Vickie Epley, MD;  Location: [facility]               CV LAB;  Service: Cardiovascular;  Laterality: N/A; No date: TEMPOROMANDIBULAR JOINT ARTHROSCOPY; Bilateral     Comment:  1994  BMI    Body Mass Index: 28.46 kg/m      Reproductive/Obstetrics negative OB ROS                              Anesthesia Physical Anesthesia Plan  ASA: 3  Anesthesia Plan: General and Regional   Post-op Pain Management: Regional block*, Ofirmev IV (intra-op)* and Toradol IV (intra-op)*   Induction: Intravenous  PONV Risk Score and Plan: 2 and Dexamethasone, Ondansetron and Midazolam  Airway Management Planned: Oral ETT  Additional Equipment:   Intra-op Plan:   Post-operative Plan: Extubation in OR  Informed Consent: I have reviewed the patients History and Physical, chart, labs and discussed the procedure including the risks, benefits and alternatives for the proposed anesthesia with the patient or authorized representative who has indicated his/her understanding and acceptance.     Dental Advisory Given  Plan Discussed with: Anesthesiologist, CRNA and Surgeon  Anesthesia Plan Comments: (Patient consented for risks of anesthesia including but not limited to:  - adverse reactions to medications - damage to eyes, teeth, lips or other oral mucosa - nerve damage due to positioning  - sore throat or hoarseness - Damage to heart, brain, nerves, lungs, other parts of body or loss of life  Patient voiced understanding.)        Anesthesia Quick Evaluation

## 2022-12-03 NOTE — H&P (Signed)
Paper H&P to be scanned into permanent record. H&P reviewed. No significant changes noted.  

## 2022-12-03 NOTE — Op Note (Signed)
SURGERY DATE: 12/03/2022    PRE-OP DIAGNOSIS:  1. Right recurrent rotator cuff tear    POST-OP DIAGNOSIS: 1. Right recurrent rotator cuff tear    PROCEDURES:  1. Right mini-open rotator cuff repair with Regeneten patch augmentation 2. Right arthroscopic extensive debridement of shoulder (glenohumeral and subacromial spaces)    SURGEON: Cato Mulligan, MD   ASSISTANT: Anitra Lauth, PA   ANESTHESIA: Gen with Exparel interscalene block    ESTIMATED BLOOD LOSS: 10cc   DRAINS:  none   TOTAL IV FLUIDS: per anesthesia      SPECIMENS: none   IMPLANTS:   - Arthrex 4.61m SwiveLock anchors x 2 - Arthrex 5.527mSwiveLock anchors x 2 - [employer] medium Regeneten patch x 1   OPERATIVE FINDINGS:  Examination under anesthesia: A careful examination under anesthesia was performed.  Passive range of motion was: FF: 150; ER at side: 40; ER in abduction: 90; IR in abduction: 50.  Anterior load shift: NT.  Posterior load shift: NT.  Sulcus in neutral: NT.  Sulcus in ER: NT.     Intra-operative findings: A thorough arthroscopic examination of the shoulder was performed.  The findings are: 1. Biceps tendon:  Not visualized intra-articularly 2. Superior labrum: injected with surrounding synovitis 3. Posterior labrum and capsule: normal 4. Inferior capsule and inferior recess: normal 5. Glenoid cartilage surface: Normal 6. Supraspinatus attachment: Small, focal area of full-thickness tearing on the bursal side with more extensive articular sided tearing.  Loose sutures from prior repair present 7. Posterior rotator cuff attachment: Normal 8. Humeral head articular cartilage: Scattered areas of grade 1-2 degenerative change 9. Rotator interval: Significant synovitis 10: Subscapularis tendon:  Attachment intact on the lesser tuberosity with significant adhesions to the tendon 11. Anterior labrum: degenerative 12. IGHL: significant synovitis around IGHL   OPERATIVE REPORT:    Indications  for procedure:  Michelle TOLLERs a [age].o. female with a history of right arthroscopic rotator cuff repair and associated procedures by me on 08/28/2022.  Patient had significant pain and sensations of catching and clicking during her immediate postoperative period that failed to improve.  She had persistent weakness to rotator cuff testing.  Repeat MRI was suggestive of full-thickness rotator cuff tear.  Given these findings, we decided to proceed with surgical management. After discussion of risks, benefits, and alternatives to surgery, the patient elected to proceed.    Procedure in detail:   I identified the patient in the pre-operative holding area.  I marked the operative shoulder with my initials. I reviewed the risks and benefits of the proposed surgical intervention, and the patient (and/or patient's guardian) wished to proceed.  Anesthesia was then performed with an Exparel interscalene block.  The patient was transferred to the operative suite and placed in the beach chair position.     SCDs were placed on the lower extremities. Appropriate IV antibiotics were administered prior to incision. The operative upper extremity was then prepped and draped in standard fashion. A time out was performed confirming the correct extremity, correct patient, and correct procedure.    I then created a standard posterior portal with an 11 blade. The glenohumeral joint was easily entered with a blunt trochar and the arthroscope introduced. The findings of diagnostic arthroscopy are described above.  A standard anterior portal was made.  I debrided degenerative tissue including the synovitic tissue about the rotator interval and IGHL as well as the anterior and superior labrum.  There was a significant amount of  synovitis, more so than usual.  I then coagulated the inflamed synovium in these regions to obtain hemostasis and reduce the risk of post-operative swelling using an Arthrocare radiofrequency device.  Also  debrided the extensive adhesions about the subscapularis such that it was no longer tethered and moved freely with internal and external rotation.    Next, the arthroscope was then introduced into the subacromial space.    A direct lateral portal was created with an 11-blade after spinal needle localization. An extensive subacromial bursectomy and debridement was performed using a combination of the shaver and Arthrocare wand.  The loose sutures were removed from the joint.  Upon probing, there was a full-thickness tear of the supraspinatus noted.  A shaver was utilized to remove any degenerative fibers of the rotator cuff.  This resulted in a V-shaped tear.     Next, A longitudinal incision from the anterolateral acromion ~6cm in length was made overlying the raphe between the anterior and middle heads of the deltoid. The raphe was identified and it was incised. The subacromial space was identified. Any remaining bursa was excised.    The arm was then internally rotated.  The rotator cuff footprint was cleared of soft tissue. A rongeur was used to gently decorticate the rotator cuff footprint to allow for improved healing.  The rotator cuff could easily be reduced to its footprint debriding the posterior leaflet anteriorly.  Two Arthrex 4.75 mm SwiveLock anchors double loaded with tape were placed just lateral to the articular margin, one anteriorly and 1 posteriorly.  All 8 strands of tape from the medial row anchors were passed through the rotator cuff in an appropriate fashion. The FiberWire from the anterior anchor was passed in a side-to-side margin convergence fashion and tied, bringing the rotator cuff down to the bone.  The FiberWire from the posterior anchor was pulled from the anchor and tied in a side-to-side fashion further laterally.  This converted the tear to a small U-shaped tear.  Next, the 4 posterior strands of suture were placed into a 5.5 mm SwiveLock anchor and placed approximately 2 cm  distal to the lateral aspect of the footprint anteriorly.  This process was repeated for the anterior strands of suture and replaced posteriorly.  This construct allowed for excellent reapproximation of the rotator cuff down to its footprint and was stable to external and internal rotation.  Given history of previous re-tear of the rotator cuff, decision was made to augment the repair with a Regeneten patch implant.  This was placed appropriately over the footprint overlying the rotator cuff repair.  Appropriate tendon staples and bone anchors were placed such that there was excellent fixation of the implant.  The wound was thoroughly irrigated.  The deltoid split was closed with 0 Vicryl.  The subdermal layer was closed with 2-0 Vicryl.  The skin was closed with 4-0 Monocryl and Dermabond. The portals were closed with 3-0 Nylon. Xeroform was applied to the incisions. A sterile dressing was applied, followed by a Polar Care sleeve and a SlingShot shoulder immobilizer/sling. The patient was awakened from anesthesia without difficulty and was transferred to the PACU in stable condition.    Of note, assistance from a PA was essential to performing the surgery.  PA was present for the entire surgery.  PA assisted with patient positioning, retraction, instrumentation, and wound closure. The surgery would have been more difficult and had longer operative time without PA assistance.      COMPLICATIONS: none   DISPOSITION:  plan for discharge home after recovery in PACU    POSTOPERATIVE PLAN: Remain in sling (except hygiene and elbow/wrist/hand RoM exercises as instructed by PT) x 6 weeks and NWB for this time. PT to begin 3-4 days after surgery.  Use large rotator cuff tear/allograft rehab protocol. Can start PROM in 4 weeks.  ASA '325mg'$  daily x 2 weeks for DVT ppx.

## 2022-12-03 NOTE — Anesthesia Procedure Notes (Signed)
Anesthesia Regional Block: Interscalene brachial plexus block   Pre-Anesthetic Checklist: , timeout performed,  Correct Patient, Correct Site, Correct Laterality,  Correct Procedure, Correct Position, site marked,  Risks and benefits discussed,  Surgical consent,  Pre-op evaluation,  At surgeon's request and post-op pain management  Laterality: Right  Prep: alcohol swabs       Needles:  Injection technique: Single-shot  Needle Type: Echogenic Needle     Needle Length: 4cm  Needle Gauge: 25     Additional Needles:   Procedures:,,,, ultrasound used (permanent image in chart),,    Narrative:  Start time: 12/03/2022 9:05 AM End time: 12/03/2022 9:09 AM Injection made incrementally with aspirations every 5 mL.  Performed by: Personally  Anesthesiologist: Ilene Qua, MD  Additional Notes: Patient's chart reviewed and they were deemed appropriate candidate for procedure, at surgeon's request. Patient educated about risks, benefits, and alternatives of the block including but not limited to: temporary or permanent nerve damage, bleeding, infection, damage to surround tissues, pneumothorax, hemidiaphragmatic paralysis, unilateral Horner's syndrome, block failure, local anesthetic toxicity. Patient expressed understanding. A formal time-out was conducted consistent with institution rules.  Monitors were applied, and minimal sedation used (see nursing record). The site was prepped with skin prep and allowed to dry, and sterile gloves were used. A high frequency linear ultrasound probe with probe cover was utilized throughout. C5-7 nerve roots located and appeared anatomically normal, local anesthetic injected around them, and echogenic block needle trajectory was monitored throughout. Aspiration performed every 35m. Lung and blood vessels were avoided. All injections were performed without resistance and free of blood and paresthesias. The patient tolerated the procedure well.  Injectate:  115mexparel + 1076m.5% bupivacaine

## 2022-12-04 ENCOUNTER — Encounter: Payer: Self-pay | Admitting: Orthopedic Surgery

## 2022-12-11 ENCOUNTER — Telehealth: Payer: No Typology Code available for payment source | Admitting: Orthopedic Surgery

## 2023-03-07 ENCOUNTER — Telehealth: Payer: Self-pay

## 2023-03-07 NOTE — Telephone Encounter (Signed)
Called to check in with patient, who had LAAO on 03/01/2022. The patient reports doing pretty well and has been following with Dr. Juliann Pares.  She has been having increased PVCs and has follow-up with Dr. Juliann Pares soon.  She will call if she would like to see Dr. Lalla Brothers again. She was grateful for call.

## 2023-03-07 NOTE — Telephone Encounter (Signed)
The patient underwent LAAO implant 03/01/2022.  Called to check in at 1 year mark.  Left message to call back.

## 2023-04-18 ENCOUNTER — Ambulatory Visit (INDEPENDENT_AMBULATORY_CARE_PROVIDER_SITE_OTHER): Payer: No Typology Code available for payment source | Admitting: Dermatology

## 2023-04-18 VITALS — BP 135/69 | HR 74

## 2023-04-18 DIAGNOSIS — D1801 Hemangioma of skin and subcutaneous tissue: Secondary | ICD-10-CM

## 2023-04-18 DIAGNOSIS — L578 Other skin changes due to chronic exposure to nonionizing radiation: Secondary | ICD-10-CM

## 2023-04-18 DIAGNOSIS — L821 Other seborrheic keratosis: Secondary | ICD-10-CM

## 2023-04-18 DIAGNOSIS — Z1283 Encounter for screening for malignant neoplasm of skin: Secondary | ICD-10-CM

## 2023-04-18 DIAGNOSIS — L814 Other melanin hyperpigmentation: Secondary | ICD-10-CM

## 2023-04-18 DIAGNOSIS — D229 Melanocytic nevi, unspecified: Secondary | ICD-10-CM

## 2023-04-18 DIAGNOSIS — L910 Hypertrophic scar: Secondary | ICD-10-CM

## 2023-04-18 DIAGNOSIS — W908XXA Exposure to other nonionizing radiation, initial encounter: Secondary | ICD-10-CM

## 2023-04-18 MED ORDER — TRIAMCINOLONE ACETONIDE 10 MG/ML IJ SUSP
10.0000 mg | Freq: Once | INTRAMUSCULAR | Status: AC
  Administered 2023-04-18: 10 mg

## 2023-04-18 NOTE — Progress Notes (Signed)
   Follow-Up Visit   Subjective  Michelle Norris is a 52 y.o. female who presents for the following: Skin Cancer Screening and Full Body Skin Exam Spot at right shoulder reports that still bothers here and fills puffy  Itchy spots at back  The patient presents for Total-Body Skin Exam (TBSE) for skin cancer screening and mole check. The patient has spots, moles and lesions to be evaluated, some may be new or changing and the patient may have concern these could be cancer.  The following portions of the chart were reviewed this encounter and updated as appropriate: medications, allergies, medical history  Review of Systems:  No other skin or systemic complaints except as noted in HPI or Assessment and Plan.  Objective  Well appearing patient in no apparent distress; mood and affect are within normal limits.  A full examination was performed including scalp, head, eyes, ears, nose, lips, neck, chest, axillae, abdomen, back, buttocks, bilateral upper extremities, bilateral lower extremities, hands, feet, fingers, toes, fingernails, and toenails. All findings within normal limits unless otherwise noted below.   Relevant physical exam findings are noted in the Assessment and Plan.  Right Shoulder - Posterior Thickened scar    Assessment & Plan   SKIN CANCER SCREENING PERFORMED TODAY.  ACTINIC DAMAGE - Chronic condition, secondary to cumulative UV/sun exposure - diffuse scaly erythematous macules with underlying dyspigmentation - Recommend daily broad spectrum sunscreen SPF 30+ to sun-exposed areas, reapply every 2 hours as needed.  - Staying in the shade or wearing long sleeves, sun glasses (UVA+UVB protection) and wide brim hats (4-inch brim around the entire circumference of the hat) are also recommended for sun protection.  - Call for new or changing lesions.  LENTIGINES, SEBORRHEIC KERATOSES, HEMANGIOMAS - Benign normal skin lesions - Benign-appearing - Call for any  changes  MELANOCYTIC NEVI - Tan-brown and/or pink-flesh-colored symmetric macules and papules - Benign appearing on exam today - Observation - Call clinic for new or changing moles - Recommend daily use of broad spectrum spf 30+ sunscreen to sun-exposed areas.   Hypertrophic scar Right Shoulder - Posterior  Intralesional steroid injection side effects were reviewed including thinning of the skin and discoloration, such as redness, lightening or darkening.  Intralesional injection - Right Shoulder - Posterior Location: right posterior scar  Informed Consent: Discussed risks (infection, pain, bleeding, bruising, thinning of the skin, loss of skin pigment, lack of resolution, and recurrence of lesion) and benefits of the procedure, as well as the alternatives. Informed consent was obtained. Preparation: The area was prepared a standard fashion.  Procedure Details: An intralesional injection was performed with Kenalog 10 mg/cc. 0.12 cc in total were injected.  Total number of injections: 2  Plan: The patient was instructed on post-op care. Recommend OTC analgesia as needed for pain.  NDC 3086-5784-69 Lot [id number] Exp March 2026  Related Medications triamcinolone acetonide (KENALOG) 10 MG/ML injection 10 mg  Return for 3 - 4 month follow up hypertrophic scar, 1 year tbse .  IAsher Muir, CMA, am acting as scribe for Armida Sans, MD.  Documentation: I have reviewed the above documentation for accuracy and completeness, and I agree with the above.  Armida Sans, MD

## 2023-04-18 NOTE — Patient Instructions (Signed)
     Melanoma ABCDEs  Melanoma is the most dangerous type of skin cancer, and is the leading cause of death from skin disease.  You are more likely to develop melanoma if you: Have light-colored skin, light-colored eyes, or red or blond hair Spend a lot of time in the sun Tan regularly, either outdoors or in a tanning bed Have had blistering sunburns, especially during childhood Have a close family member who has had a melanoma Have atypical moles or large birthmarks  Early detection of melanoma is key since treatment is typically straightforward and cure rates are extremely high if we catch it early.   The first sign of melanoma is often a change in a mole or a new dark spot.  The ABCDE system is a way of remembering the signs of melanoma.  A for asymmetry:  The two halves do not match. B for border:  The edges of the growth are irregular. C for color:  A mixture of colors are present instead of an even brown color. D for diameter:  Melanomas are usually (but not always) greater than 6mm - the size of a pencil eraser. E for evolution:  The spot keeps changing in size, shape, and color.  Please check your skin once per month between visits. You can use a small mirror in front and a large mirror behind you to keep an eye on the back side or your body.   If you see any new or changing lesions before your next follow-up, please call to schedule a visit.  Please continue daily skin protection including broad spectrum sunscreen SPF 30+ to sun-exposed areas, reapplying every 2 hours as needed when you're outdoors.   Staying in the shade or wearing long sleeves, sun glasses (UVA+UVB protection) and wide brim hats (4-inch brim around the entire circumference of the hat) are also recommended for sun protection.    Due to recent changes in healthcare laws, you may see results of your pathology and/or laboratory studies on MyChart before the doctors have had a chance to review them. We  understand that in some cases there may be results that are confusing or concerning to you. Please understand that not all results are received at the same time and often the doctors may need to interpret multiple results in order to provide you with the best plan of care or course of treatment. Therefore, we ask that you please give us 2 business days to thoroughly review all your results before contacting the office for clarification. Should we see a critical lab result, you will be contacted sooner.   If You Need Anything After Your Visit  If you have any questions or concerns for your doctor, please call our main line at [phone] and press option 4 to reach your doctor's medical assistant. If no one answers, please leave a voicemail as directed and we will return your call as soon as possible. Messages left after 4 pm will be answered the following business day.   You may also send us a message via MyChart. We typically respond to MyChart messages within 1-2 business days.  For prescription refills, please ask your pharmacy to contact our office. Our fax number is [phone].  If you have an urgent issue when the clinic is closed that cannot wait until the next business day, you can page your doctor at the number below.    Please note that while we do our best to be available for urgent issues   outside of office hours, we are not available 24/7.   If you have an urgent issue and are unable to reach us, you may choose to seek medical care at your doctor's office, retail clinic, urgent care center, or emergency room.  If you have a medical emergency, please immediately call 911 or go to the emergency department.  Pager Numbers  - Dr. Kowalski: [phone]  - Dr. Moye: [phone]  - Dr. Stewart: [phone]  In the event of inclement weather, please call our main line at [phone] for an update on the status of any delays or closures.  Dermatology Medication Tips: Please  keep the boxes that topical medications come in in order to help keep track of the instructions about where and how to use these. Pharmacies typically print the medication instructions only on the boxes and not directly on the medication tubes.   If your medication is too expensive, please contact our office at [phone] option 4 or send us a message through MyChart.   We are unable to tell what your co-pay for medications will be in advance as this is different depending on your insurance coverage. However, we may be able to find a substitute medication at lower cost or fill out paperwork to get insurance to cover a needed medication.   If a prior authorization is required to get your medication covered by your insurance company, please allow us 1-2 business days to complete this process.  Drug prices often vary depending on where the prescription is filled and some pharmacies may offer cheaper prices.  The website www.goodrx.com contains coupons for medications through different pharmacies. The prices here do not account for what the cost may be with help from insurance (it may be cheaper with your insurance), but the website can give you the price if you did not use any insurance.  - You can print the associated coupon and take it with your prescription to the pharmacy.  - You may also stop by our office during regular business hours and pick up a GoodRx coupon card.  - If you need your prescription sent electronically to a different pharmacy, notify our office through [facility] MyChart or by phone at [phone] option 4.     Si Usted Necesita Algo Despus de Su Visita  Tambin puede enviarnos un mensaje a travs de MyChart. Por lo general respondemos a los mensajes de MyChart en el transcurso de 1 a 2 das hbiles.  Para renovar recetas, por favor pida a su farmacia que se ponga en contacto con nuestra oficina. Nuestro nmero de fax es el [phone].  Si tiene un asunto urgente  cuando la clnica est cerrada y que no puede esperar hasta el siguiente da hbil, puede llamar/localizar a su doctor(a) al nmero que aparece a continuacin.   Por favor, tenga en cuenta que aunque hacemos todo lo posible para estar disponibles para asuntos urgentes fuera del horario de oficina, no estamos disponibles las 24 horas del da, los 7 das de la semana.   Si tiene un problema urgente y no puede comunicarse con nosotros, puede optar por buscar atencin mdica  en el consultorio de su doctor(a), en una clnica privada, en un centro de atencin urgente o en una sala de emergencias.  Si tiene una emergencia mdica, por favor llame inmediatamente al 911 o vaya a la sala de emergencias.  Nmeros de bper  - Dr. Kowalski: [phone]  - Dra. Moye: [phone]  - Dra. Stewart: [phone]  En caso   de inclemencias del tiempo, por favor llame a nuestra lnea principal al [phone] para una actualizacin sobre el estado de cualquier retraso o cierre.  Consejos para la medicacin en dermatologa: Por favor, guarde las cajas en las que vienen los medicamentos de uso tpico para ayudarle a seguir las instrucciones sobre dnde y cmo usarlos. Las farmacias generalmente imprimen las instrucciones del medicamento slo en las cajas y no directamente en los tubos del medicamento.   Si su medicamento es muy caro, por favor, pngase en contacto con nuestra oficina llamando al [phone] y presione la opcin 4 o envenos un mensaje a travs de MyChart.   No podemos decirle cul ser su copago por los medicamentos por adelantado ya que esto es diferente dependiendo de la cobertura de su seguro. Sin embargo, es posible que podamos encontrar un medicamento sustituto a menor costo o llenar un formulario para que el seguro cubra el medicamento que se considera necesario.   Si se requiere una autorizacin previa para que su compaa de seguros cubra su medicamento, por favor permtanos de 1 a 2  das hbiles para completar este proceso.  Los precios de los medicamentos varan con frecuencia dependiendo del lugar de dnde se surte la receta y alguna farmacias pueden ofrecer precios ms baratos.  El sitio web www.goodrx.com tiene cupones para medicamentos de diferentes farmacias. Los precios aqu no tienen en cuenta lo que podra costar con la ayuda del seguro (puede ser ms barato con su seguro), pero el sitio web puede darle el precio si no utiliz ningn seguro.  - Puede imprimir el cupn correspondiente y llevarlo con su receta a la farmacia.  - Tambin puede pasar por nuestra oficina durante el horario de atencin regular y recoger una tarjeta de cupones de GoodRx.  - Si necesita que su receta se enve electrnicamente a una farmacia diferente, informe a nuestra oficina a travs de MyChart de [facility] o por telfono llamando al [phone] y presione la opcin 4.  

## 2023-04-19 ENCOUNTER — Encounter: Payer: Self-pay | Admitting: Dermatology

## 2023-06-12 ENCOUNTER — Other Ambulatory Visit: Payer: Self-pay | Admitting: Physician Assistant

## 2023-06-12 DIAGNOSIS — Z1231 Encounter for screening mammogram for malignant neoplasm of breast: Secondary | ICD-10-CM

## 2023-07-25 ENCOUNTER — Ambulatory Visit (INDEPENDENT_AMBULATORY_CARE_PROVIDER_SITE_OTHER): Payer: No Typology Code available for payment source | Admitting: Dermatology

## 2023-07-25 VITALS — BP 117/77 | HR 85

## 2023-07-25 DIAGNOSIS — L910 Hypertrophic scar: Secondary | ICD-10-CM | POA: Diagnosis not present

## 2023-07-25 DIAGNOSIS — L820 Inflamed seborrheic keratosis: Secondary | ICD-10-CM | POA: Diagnosis not present

## 2023-07-25 DIAGNOSIS — L578 Other skin changes due to chronic exposure to nonionizing radiation: Secondary | ICD-10-CM | POA: Diagnosis not present

## 2023-07-25 DIAGNOSIS — W908XXA Exposure to other nonionizing radiation, initial encounter: Secondary | ICD-10-CM

## 2023-07-25 DIAGNOSIS — L821 Other seborrheic keratosis: Secondary | ICD-10-CM | POA: Diagnosis not present

## 2023-07-25 DIAGNOSIS — Z7189 Other specified counseling: Secondary | ICD-10-CM

## 2023-07-25 MED ORDER — TRIAMCINOLONE ACETONIDE 10 MG/ML IJ SUSP: 10.0000 mg | Freq: Once | INTRAMUSCULAR | Status: AC

## 2023-07-25 NOTE — Patient Instructions (Signed)
Due to recent changes in healthcare laws, you may see results of your pathology and/or laboratory studies on MyChart before the doctors have had a chance to review them. We understand that in some cases there may be results that are confusing or concerning to you. Please understand that not all results are received at the same time and often the doctors may need to interpret multiple results in order to provide you with the best plan of care or course of treatment. Therefore, we ask that you please give [place] 2 business days to thoroughly review all your results before contacting the office for clarification. Should we see a critical lab result, you will be contacted sooner.   If You Need Anything After Your Visit  If you have any questions or concerns for your doctor, please call our main line at [phone] and press option 4 to reach your doctor's medical assistant. If no one answers, please leave a voicemail as directed and we will return your call as soon as possible. Messages left after 4 pm will be answered the following business day.   You may also send [place] a message via MyChart. We typically respond to MyChart messages within 1-2 business days.  For prescription refills, please ask your pharmacy to contact our office. Our fax number is [phone].  If you have an urgent issue when the clinic is closed that cannot wait until the next business day, you can page your doctor at the number below.    Please note that while we do our best to be available for urgent issues outside of office hours, we are not available 24/7.   If you have an urgent issue and are unable to reach [place], you may choose to seek medical care at your doctor's office, retail clinic, urgent care center, or emergency room.  If you have a medical emergency, please immediately call 911 or go to the emergency department.  Pager Numbers  - Dr. Gwen Pounds: [phone]  - Dr. Roseanne Reno: [phone]  - Dr. Katrinka Blazing: [phone]    In the event of inclement weather, please call our main line at [phone] for an update on the status of any delays or closures.  Dermatology Medication Tips: Please keep the boxes that topical medications come in in order to help keep track of the instructions about where and how to use these. Pharmacies typically print the medication instructions only on the boxes and not directly on the medication tubes.   If your medication is too expensive, please contact our office at [phone] option 4 or send [place] a message through MyChart.   We are unable to tell what your co-pay for medications will be in advance as this is different depending on your insurance coverage. However, we may be able to find a substitute medication at lower cost or fill out paperwork to get insurance to cover a needed medication.   If a prior authorization is required to get your medication covered by your insurance company, please allow [place] 1-2 business days to complete this process.  Drug prices often vary depending on where the prescription is filled and some pharmacies may offer cheaper prices.  The website www.goodrx.com contains coupons for medications through different pharmacies. The prices here do not account for what the cost may be with help from insurance (it may be cheaper with your insurance), but the website can give you the price if you did not use any insurance.  - You can print the associated coupon and take it  with your prescription to the pharmacy.  - You may also stop by our office during regular business hours and pick up a GoodRx coupon card.  - If you need your prescription sent electronically to a different pharmacy, notify our office through [facility] or by phone at [phone] option 4.     Si Usted Necesita Algo Despus de Su Visita  Tambin puede enviarnos un mensaje a travs de Clinical cytogeneticist. Por lo general respondemos a los mensajes de MyChart en el transcurso de 1 a 2 das  hbiles.  Para renovar recetas, por favor pida a su farmacia que se ponga en contacto con nuestra oficina. Annie Sable de fax es [place] [phone].  Si tiene un asunto urgente cuando la clnica est cerrada y que no puede esperar hasta el siguiente da hbil, puede llamar/localizar a su doctor(a) al nmero que aparece a continuacin.   Por favor, tenga en cuenta que aunque hacemos todo lo posible para estar disponibles para asuntos urgentes fuera del horario de [place], no estamos disponibles las 24 horas del da, los 7 [address]  [address] de la [place].   Si tiene un problema urgente y no puede comunicarse con nosotros, puede optar por buscar atencin mdica  en el consultorio de su doctor(a), en una clnica privada, en un centro de atencin urgente o en una sala de emergencias.  Si tiene Engineer, drilling, por favor llame inmediatamente al 911 o vaya a la sala de emergencias.  Nmeros de bper  - Dr. Gwen Pounds: [phone]  - Dra. Roseanne Reno: [phone]  - Dr. Katrinka Blazing: [phone]   En caso de inclemencias del tiempo, por favor llame a Lacy Duverney principal al [phone] para una actualizacin sobre el [place] de cualquier retraso o cierre.  Consejos para la medicacin en dermatologa: Por favor, guarde las cajas en las que vienen los medicamentos de uso tpico para ayudarle a seguir las instrucciones sobre dnde y cmo usarlos. Las farmacias generalmente imprimen las instrucciones del medicamento slo en las cajas y no directamente en los tubos del [place].   Si su medicamento es muy caro, por favor, pngase en contacto con Rolm Gala llamando al [phone] y presione la opcin 4 o envenos un mensaje a travs de Clinical cytogeneticist.   No podemos decirle cul ser su copago por los medicamentos por adelantado ya que esto es diferente dependiendo de la cobertura de su seguro. Sin embargo, es posible que podamos encontrar un medicamento sustituto a Audiological scientist un formulario para que el  seguro cubra el medicamento que se considera necesario.   Si se requiere una autorizacin previa para que su compaa de seguros [place] su medicamento, por favor permtanos de 1 a 2 das hbiles para completar [address]  Los precios de los medicamentos varan con frecuencia dependiendo del Environmental consultant de dnde se surte la receta y alguna farmacias pueden ofrecer precios ms baratos.  El sitio web www.goodrx.com tiene cupones para medicamentos de Health and safety inspector. Los precios aqu no tienen en cuenta lo que podra costar con la ayuda del seguro (puede ser ms barato con su seguro), pero el sitio web puede darle el precio si no utiliz Tourist information centre manager.  - Puede imprimir el cupn correspondiente y llevarlo con su receta a la farmacia.  - Tambin puede pasar por nuestra oficina durante el horario de atencin regular y Education officer, museum una tarjeta de cupones de GoodRx.  - Si necesita que su receta se enve electrnicamente a Psychiatrist, informe a nuestra oficina a travs de MyChart de  [place] o por telfono llamando al [phone] y presione la opcin 4.

## 2023-07-25 NOTE — Progress Notes (Signed)
   Follow-Up Visit   Subjective  Michelle Norris is a 52 y.o. female who presents for the following: hypertrophic scar follow up, R post shoulder, s/p ILK, improved, but still bothersome.   The patient has spots, moles and lesions to be evaluated, some may be new or changing and the patient may have concern these could be cancer.   The following portions of the chart were reviewed this encounter and updated as appropriate: medications, allergies, medical history  Review of Systems:  No other skin or systemic complaints except as noted in HPI or Assessment and Plan.  Objective  Well appearing patient in no apparent distress; mood and affect are within normal limits.    A focused examination was performed of the following areas: the back   Relevant exam findings are noted in the Assessment and Plan.  R post shoulder Thickened scar   Left Upper Back x 2 (2) Erythematous stuck-on, waxy papule or plaque    Assessment & Plan     Hypertrophic scar R post shoulder  Intralesional steroid injection side effects were reviewed including thinning of the skin and discoloration, such as redness, lightening or darkening.     Intralesional injection - R post shoulder Location: R post shoulder   Informed Consent: Discussed risks (infection, pain, bleeding, bruising, thinning of the skin, loss of skin pigment, lack of resolution, and recurrence of lesion) and benefits of the procedure, as well as the alternatives. Informed consent was obtained. Preparation: The area was prepared a standard fashion.  Procedure Details: An intralesional injection was performed with Kenalog 10 mg/cc. 0.1 cc in total were injected.  Total number of injections: 3  Plan: The patient was instructed on post-op care. Recommend OTC analgesia as needed for pain.  NDC 7829-5621-30  Related Medications triamcinolone acetonide (KENALOG) 10 MG/ML injection 10 mg   Inflamed seborrheic keratosis (2) Left  Upper Back x 2  Destruction of lesion - Left Upper Back x 2 (2) Complexity: simple   Destruction method: cryotherapy   Informed consent: discussed and consent obtained   Timeout:  patient name, date of birth, surgical site, and procedure verified Lesion destroyed using liquid nitrogen: Yes   Region frozen until ice ball extended beyond lesion: Yes   Outcome: patient tolerated procedure well with no complications   Post-procedure details: wound care instructions given    SEBORRHEIC KERATOSIS - Stuck-on, waxy, tan-brown papules and/or plaques  - Benign-appearing - Discussed benign etiology and prognosis. - Observe - Call for any changes  ACTINIC DAMAGE - chronic, secondary to cumulative UV radiation exposure/sun exposure over time - diffuse scaly erythematous macules with underlying dyspigmentation - Recommend daily broad spectrum sunscreen SPF 30+ to sun-exposed areas, reapply every 2 hours as needed.  - Recommend staying in the shade or wearing long sleeves, sun glasses (UVA+UVB protection) and wide brim hats (4-inch brim around the entire circumference of the hat). - Call for new or changing lesions.   Return for appointment as scheduled.  Maylene Roes, CMA, am acting as scribe for Armida Sans, MD .   Documentation: I have reviewed the above documentation for accuracy and completeness, and I agree with the above.  Armida Sans, MD
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
## 2023-08-05 ENCOUNTER — Ambulatory Visit
Admission: RE | Admit: 2023-08-05 | Discharge: 2023-08-05 | Disposition: A | Discharge status: Home / Self Care | Payer: No Typology Code available for payment source | Source: Ambulatory Visit | Attending: Physician Assistant | Admitting: Physician Assistant

## 2023-08-05 ENCOUNTER — Encounter: Payer: Self-pay | Admitting: Physician Assistant

## 2023-08-05 DIAGNOSIS — Z1231 Encounter for screening mammogram for malignant neoplasm of breast: Secondary | ICD-10-CM | POA: Insufficient documentation

## 2023-08-06 ENCOUNTER — Other Ambulatory Visit: Payer: Self-pay | Admitting: Physician Assistant

## 2023-08-06 DIAGNOSIS — R2232 Localized swelling, mass and lump, left upper limb: Secondary | ICD-10-CM

## 2023-08-10 ENCOUNTER — Encounter: Payer: Self-pay | Admitting: Dermatology

## 2023-08-16 ENCOUNTER — Ambulatory Visit
Admission: RE | Admit: 2023-08-16 | Discharge: 2023-08-16 | Disposition: A | Discharge status: Home / Self Care | Payer: No Typology Code available for payment source | Source: Ambulatory Visit | Attending: Physician Assistant | Admitting: Physician Assistant

## 2023-08-16 ENCOUNTER — Other Ambulatory Visit: Payer: Self-pay | Admitting: Physician Assistant

## 2023-08-16 DIAGNOSIS — R2232 Localized swelling, mass and lump, left upper limb: Secondary | ICD-10-CM | POA: Insufficient documentation

## 2023-11-15 LAB — LIPID PANEL
Cholesterol: 113
HDL: 51
LDL: 50
Triglycerides: 61 (ref 40–160)

## 2023-11-15 LAB — HEMOGLOBIN A1C: Hemoglobin A1C: 5.5

## 2023-11-15 LAB — POCT ABI - SCREENING FOR PILOT NO CHARGE
Left ABI: 1.4
Right ABI: 1.2

## 2023-11-15 NOTE — Progress Notes (Signed)
 No pressing issues at this time.

## 2023-11-19 ENCOUNTER — Other Ambulatory Visit: Payer: Self-pay | Admitting: Orthopedic Surgery

## 2023-11-19 DIAGNOSIS — M75121 Complete rotator cuff tear or rupture of right shoulder, not specified as traumatic: Secondary | ICD-10-CM

## 2023-11-21 ENCOUNTER — Encounter: Payer: Self-pay | Admitting: Orthopedic Surgery

## 2023-12-09 NOTE — Progress Notes (Unsigned)
 The patient attended the Heart Health Screening event on 11/15/2023. At the screening event pt BP was 120/75 WNL, Hemoglobin A1C 5.5 WNL, Total Cholesterol 113 WNL, HDL, 51 WNL, Triglycerides 61 WNL, LDL 50 WNL.  At the event the patient documented her PCP is Patrice Paradise, MD. Pt documented she has private insurance, pt documented no tobacco use and no SDOH needs were indicated at the event.   Per chart review pt PCP listed is Patrice Paradise, MD. Pt insurance coverage is with [employer] and [employer]. Post initial event f/u note, Pt last office visit was on 06/12/2023 with Merlinda Frederick, Merleen Milliner, MD, Internal Medicine [facility] in [place]. At the pt annual physical exam 06/12/2023, problems addressed by PCP were Hypertension, Cardiovascular.   At the office visit on 06/12/2023 pt BP was 98/64 WNL at the visit. Cardiovascular and Hypertension is on pt problem list that's being addressed by PCP. Chart review also indicates 7 future appts for pt with specialist. Pt also has an appt with her PCP on tomorrow 12/10/2023 Patrice Paradise, MD for Internal Medicine at [facility] in [place], and follow up appt with is Patrice Paradise, MD on 06/02/2024. Pt also has a Cardiology appt with Mirian Capuchin at [facility] on 02/19/2024. No additional Health equity team support indicated at this time.

## 2023-12-24 ENCOUNTER — Ambulatory Visit
Admission: RE | Admit: 2023-12-24 | Discharge: 2023-12-24 | Disposition: A | Discharge status: Home / Self Care | Source: Ambulatory Visit | Attending: Orthopedic Surgery | Admitting: Orthopedic Surgery

## 2023-12-24 DIAGNOSIS — M75121 Complete rotator cuff tear or rupture of right shoulder, not specified as traumatic: Secondary | ICD-10-CM

## 2023-12-25 ENCOUNTER — Ambulatory Visit
Admission: RE | Admit: 2023-12-25 | Discharge: 2023-12-25 | Disposition: A | Discharge status: Home / Self Care | Source: Ambulatory Visit | Attending: Orthopedic Surgery | Admitting: Orthopedic Surgery

## 2024-02-28 ENCOUNTER — Other Ambulatory Visit: Payer: Self-pay | Admitting: Family Medicine

## 2024-02-28 DIAGNOSIS — M5412 Radiculopathy, cervical region: Secondary | ICD-10-CM

## 2024-03-09 NOTE — Discharge Instructions (Signed)

## 2024-03-10 ENCOUNTER — Ambulatory Visit
Admission: RE | Admit: 2024-03-10 | Discharge: 2024-03-10 | Disposition: A | Discharge status: Home / Self Care | Source: Ambulatory Visit | Attending: Family Medicine | Admitting: Family Medicine

## 2024-03-10 DIAGNOSIS — M5412 Radiculopathy, cervical region: Secondary | ICD-10-CM

## 2024-03-10 MED ORDER — IOPAMIDOL (ISOVUE-300) INJECTION 61%
15.0000 mL | Freq: Once | INTRAVENOUS | Status: AC | PRN
  Administered 2024-03-10: 3 mL

## 2024-03-10 MED ORDER — TRIAMCINOLONE ACETONIDE 40 MG/ML IJ SUSP (RADIOLOGY)
60.0000 mg | Freq: Once | INTRAMUSCULAR | Status: AC
  Administered 2024-03-10: 60 mg via EPIDURAL

## 2024-04-15 ENCOUNTER — Other Ambulatory Visit (INDEPENDENT_AMBULATORY_CARE_PROVIDER_SITE_OTHER): Payer: Self-pay | Admitting: Nurse Practitioner

## 2024-04-15 DIAGNOSIS — R42 Dizziness and giddiness: Secondary | ICD-10-CM

## 2024-04-15 DIAGNOSIS — R252 Cramp and spasm: Secondary | ICD-10-CM

## 2024-04-16 ENCOUNTER — Encounter (INDEPENDENT_AMBULATORY_CARE_PROVIDER_SITE_OTHER): Payer: Self-pay | Admitting: Nurse Practitioner

## 2024-04-16 ENCOUNTER — Ambulatory Visit (INDEPENDENT_AMBULATORY_CARE_PROVIDER_SITE_OTHER): Payer: Self-pay | Admitting: Nurse Practitioner

## 2024-04-16 ENCOUNTER — Ambulatory Visit (INDEPENDENT_AMBULATORY_CARE_PROVIDER_SITE_OTHER): Payer: Self-pay

## 2024-04-16 ENCOUNTER — Other Ambulatory Visit (INDEPENDENT_AMBULATORY_CARE_PROVIDER_SITE_OTHER): Payer: Self-pay

## 2024-04-16 VITALS — BP 112/80 | HR 76 | Resp 18 | Ht 68.0 in | Wt 198.2 lb

## 2024-04-16 DIAGNOSIS — R252 Cramp and spasm: Secondary | ICD-10-CM

## 2024-04-16 DIAGNOSIS — R42 Dizziness and giddiness: Secondary | ICD-10-CM | POA: Diagnosis not present

## 2024-04-20 ENCOUNTER — Encounter (INDEPENDENT_AMBULATORY_CARE_PROVIDER_SITE_OTHER): Payer: Self-pay | Admitting: Nurse Practitioner

## 2024-04-20 LAB — VAS US ABI WITH/WO TBI
Left ABI: 1.21
Right ABI: 1.24

## 2024-04-20 NOTE — Progress Notes (Signed)
 Subjective:    Patient ID: Michelle Norris, female    DOB: [birth year], 53 y.o.   MRN: [id number] Chief Complaint  Patient presents with   New Patient (Initial Visit)    Ref Caffaro consult dizziness and leg cramps    The patient presents today for evaluation of ongoing leg cramps as well as dizziness.  She previously had a history of neuropathy but notes that that has gone away.  She notes that she has cramps down the front of her legs.  It is mostly her right leg but it can happen both.  These cramps happen nightly but they can also happen during the day.  What has been most alarming for the patient is that they have happened with driving and made it very difficult for her to utilize her legs.  This is even happening when she is just sitting.  She has tried increasing her water intake.  She also tried increasing electrolytes as well as taking magnesium  and pickle juice and other remedies for cramping that have not been useful.  In addition she notes that she has had dizziness since 2018.  She has a history of an irregular heart rate but the dizziness comes and goes.  Today her bilateral internal carotid arteries have 39% stenosis with antegrade flow.  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  She also had a history of cerebellar stroke.  The patient has a right ABI 1.24 and a left of 1.31.  Rest from triphasic tibial artery waveforms bilaterally with normal toe waveforms bilaterally.    Review of Systems  Cardiovascular:        Leg cramps  Neurological:  Positive for dizziness.  All other systems reviewed and are negative.      Objective:   Physical Exam Vitals reviewed.  HENT:     Head: Normocephalic.  Cardiovascular:     Rate and Rhythm: Normal rate.     Pulses: Normal pulses.  Pulmonary:     Effort: Pulmonary effort is normal.  Skin:    General: Skin is warm and dry.  Neurological:     Mental Status: She is alert and oriented to person, place, and time.   Psychiatric:        Mood and Affect: Mood normal.        Behavior: Behavior normal.        Thought Content: Thought content normal.        Judgment: Judgment normal.     BP 112/80   Pulse 76   Resp 18   Ht 5' 8 (1.727 m)   Wt 198 lb 3.2 oz (89.9 kg)   BMI 30.14 kg/m   Past Medical History:  Diagnosis Date   Aortic atherosclerosis (HCC)    Atrial fibrillation (HCC)    a.) CHA2DS2VASc = 6 (sex, CHF, HTN, CVA x2, vascular disease history);  b.) s/p DCCV (150 J x1) 12/02/2017; c.) s/p Watchman device placement 03/01/2022; d.) rate/rhythm maintained now without pharmacological intervention; chronically anticoagulated with clopidogrel    Atypical mole 08/03/2021   L sacral, moderate atypia   Bradycardia    CAD (coronary artery disease)    a.) LHC 10/15/2017: EF 35-45%, LVEDP norm, normal coronaries; b.) cCTA 08/10/2021: EF 67%. CAC score = 19.5 (90th percentile for age/sex match); c.) cCTA 05/01/2022: CAC score 13 (87th percentile for age/sex match)   Cardiomyopathy (HCC)    Cerebellar stroke (HCC) 10/18/2017   a.) MRI brain 10/18/2017: 5 mm acute infarct right superior  cerebellum   CHF (congestive heart failure) (HCC)    Endometriosis    History of Roux-en-Y gastric bypass    Hyperlipidemia    Hypertension    Leukocytosis 07/2018   being monitored by pcp   Long term current use of antithrombotics/antiplatelets    a.) clopidogrel    Migraines    NICM (nonischemic cardiomyopathy) (HCC)    a.) TTE 09/17/2017: EF 30%; b.) MPI 09/20/2017: EF 45-50%; c.) LHC 10/15/2017: EF 35-45%; d.) TTE 03/23/2020: EF 45%; e.) TTE 05/08/2021: EF 50%; f.) cCTA 08/10/2021: EF 67%   NSVT (nonsustained ventricular tachycardia) (HCC) 03/26/2022   a.) Holter 03/26/2022 - 5 runs of NSVT with the longest lasting 18 beats   Obesity    OSA (obstructive sleep apnea)    a.) no nocturnal PAP therapy; has Inspire implant   Osteoarthritis    Peripheral edema    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/2022   a.) s/p Watchman FLX 27 mm device placement   PSVT (paroxysmal supraventricular tachycardia) (HCC) 03/26/2022   a.) Holter 03/26/2022 - 4 runs of SVT with the longest lasting 19 beats   [facility] spotted fever 06/2018   bulls-eye rash on back   Vitamin B12 deficiency     Social History   Socioeconomic History   Marital status: Married    Spouse name: Dorise   Number of children: 4   Years of education: Not on file   Highest education level: Not on file  Occupational History   Occupation: unemployed  Tobacco Use   Smoking status: Never   Smokeless tobacco: Never  Vaping Use   Vaping status: Never Used  Substance and Sexual Activity   Alcohol use: Not Currently    Comment: occasional/ socially   Drug use: No   Sexual activity: Yes    Birth control/protection: Surgical  Other Topics Concern   Not on file  Social History Narrative   Not on file   Social Drivers of Health   Financial Resource Strain: Low Risk  (06/12/2023)   Received from [facility]   Overall Financial Resource Strain (CARDIA)    Difficulty of Paying Living Expenses: Not hard at all  Food Insecurity: No Food Insecurity (11/15/2023)   Hunger Vital Sign    Worried About Running Out of Food in the Last Year: Never true    Ran Out of Food in the Last Year: Never true  Transportation Needs: No Transportation Needs (11/15/2023)   PRAPARE - Administrator, Civil Service (Medical): No    Lack of Transportation (Non-Medical): No  Physical Activity: Unknown (09/03/2017)   Received from [facility]   Exercise Vital Sign    Days of Exercise per Week: Patient declined    Minutes of Exercise per Session: Patient declined  Stress: Unknown (09/03/2017)   Received from [facility] of Occupational Health - Occupational Stress Questionnaire    Feeling of Stress : Patient declined  Social Connections:  Unknown (09/03/2017)   Received from [facility]   Social Connection and Isolation Panel    Frequency of Communication with Friends and Family: Patient declined    Frequency of Social Gatherings with Friends and Family: Patient declined    Attends Religious Services: Patient declined    Active Member of Clubs or Organizations: Patient declined    Attends Banker Meetings: Patient declined    Marital Status: Patient declined  Intimate  Partner Violence: Not At Risk (11/15/2023)   Humiliation, Afraid, Rape, and Kick questionnaire    Fear of Current or Ex-Partner: No    Emotionally Abused: No    Physically Abused: No    Sexually Abused: No    Past Surgical History:  Procedure Laterality Date   BICEPT TENODESIS Left 08/25/2018   Procedure: BICEPS TENODESIS;  Surgeon: Tobie Priest, MD;  Location: [facility] ORS;  Service: Orthopedics;  Laterality: Left;   BLADDER TACK  2013   WITH HYSTERECTOMY   CARDIOVERSION N/A 12/02/2017   Procedure: CARDIOVERSION;  Surgeon: Florencio Cara BIRCH, MD;  Location: [facility] ORS;  Service: Cardiovascular;  Laterality: N/A;   CARPAL TUNNEL RELEASE Right 09/26/2022   COLONOSCOPY WITH ESOPHAGOGASTRODUODENOSCOPY (EGD)  05/18/2020   ESOPHAGOGASTRODUODENOSCOPY  08/29/2020   INCISION AND DRAINAGE ABSCESS  08/07/2022   INSPIRE IMPLANT     LAPAROSCOPIC GASTRIC BYPASS  08/28/2020   LEFT ATRIAL APPENDAGE OCCLUSION N/A 03/01/2022   Procedure: LEFT ATRIAL APPENDAGE OCCLUSION;  Surgeon: Cindie Ole DASEN, MD;  Location: [facility] INVASIVE CV LAB;  Service: Cardiovascular;  Laterality: N/A;   LEFT HEART CATH AND CORONARY ANGIOGRAPHY Left 10/15/2017   Procedure: LEFT HEART CATH AND CORONARY ANGIOGRAPHY;  Surgeon: Florencio Cara BIRCH, MD;  Location: [facility] INVASIVE CV LAB;  Service: Cardiovascular;  Laterality: Left;   LEFT HEART CATH AND CORONARY ANGIOGRAPHY Left 10/15/2017   Procedure: LEFT HEART CATH AND CORONARY ANGIOGRAPHY; Location: [facility]; Surgeon: Margie Florencio, MD   ORIF TIBIA & FIBULA FRACTURES Right 2004   fall on right ; ANKLE FRACTURE   ROBOTIC ASSISTED LAPAROSCOPIC CHOLECYSTECTOMY  09/07/2022   SHOULDER ARTHROSCOPY WITH OPEN ROTATOR CUFF REPAIR Left 08/25/2018   Procedure: SHOULDER ARTHROSCOPY WITH SUBCROMIAL DECOMPRESSION  DISTAL CLAVICLE EXCISION VS.OPEN ROTATOR CUFF REPAIR;  Surgeon: Tobie Priest, MD;  Location: [facility] ORS;  Service: Orthopedics;  Laterality: Left;   SHOULDER ARTHROSCOPY WITH OPEN ROTATOR CUFF REPAIR Right 12/03/2022   Procedure: Right shoulder revision mini open rotator cuff with Regeneten patch augmentation;  Surgeon: Tobie Priest, MD;  Location: [facility] OR[facility];  Service: Orthopedics;  Laterality: Right;   SHOULDER ARTHROSCOPY WITH SUBACROMIAL DECOMPRESSION AND OPEN ROTATOR C Right 08/28/2022   Procedure: Right shoulder arthroscopic rotator cuff repair, subacromial decompression, distal clavicle excision, and biceps tenodesis;  Surgeon: Tobie Priest, MD;  Location: [facility] ORS;  Service: Orthopedics;  Laterality: Right;   SUPRACERVICAL ABDOMINAL HYSTERECTOMY N/A 2013   TEE WITHOUT CARDIOVERSION N/A 03/01/2022   Procedure: TRANSESOPHAGEAL ECHOCARDIOGRAM (TEE);  Surgeon: Cindie Ole DASEN, MD;  Location: [facility] INVASIVE CV LAB;  Service: Cardiovascular;  Laterality: N/A;   TEMPOROMANDIBULAR JOINT ARTHROSCOPY Bilateral    1994   TUBAL LIGATION  1997    Family History  Problem Relation Age of Onset   Breast cancer Maternal Grandmother    Diabetes Maternal Grandmother    Dementia Maternal Grandmother    Atrial fibrillation Father    Hypertension Father    Congestive Heart Failure Father    Diabetes Paternal Grandmother    Heart attack Paternal Grandmother     Allergies  Allergen Reactions   Wasp Venom Shortness Of Breath and Swelling   Latex Swelling and Other (See Comments)    Burning, red, itchy, puffiness.    Amoxicillin Other (See Comments)    Yeast infection   Oxycodone  Itching    Tolerates hydrocodone  fine.    Tape Itching and Rash       Latest Ref Rng & Units 11/30/2022   10:24 AM 07/03/2022    4:35 PM 04/16/2022  11:43 AM  CBC  WBC 4.0 - 10.5 K/uL 5.3  6.5  5.2   Hemoglobin 12.0 - 15.0 g/dL 86.4  86.8  86.3   Hematocrit 36.0 - 46.0 % 41.1  40.4  42.4   Platelets 150 - 400 K/uL 239  264  271       CMP     Component Value Date/Time   NA 139 11/30/2022 1024   NA 139 04/16/2022 1143   K 3.7 11/30/2022 1024   CL 104 11/30/2022 1024   CO2 27 11/30/2022 1024   GLUCOSE 59 (L) 11/30/2022 1024   BUN 12 11/30/2022 1024   BUN 12 04/16/2022 1143   CREATININE 0.52 11/30/2022 1024   CALCIUM  9.0 11/30/2022 1024   PROT 7.5 07/03/2022 1635   ALBUMIN 4.4 07/03/2022 1635   AST 33 07/03/2022 1635   ALT 57 (H) 07/03/2022 1635   ALKPHOS 90 07/03/2022 1635   BILITOT 0.9 07/03/2022 1635   EGFR 111 04/16/2022 1143   GFRNONAA >60 11/30/2022 1024     No results found.     Assessment & Plan:   1. Leg cramps (Primary) Noninvasive studies today show adequate arterial perfusion and that based on the studies that some cause for her ongoing leg cramps.  In some instances venous insufficiency can cause leg cramping.  Will have the patient return for venous reflux studies.  If her cramping is caused by venous insufficiency, medical therapy would be helpful including use of medical grade compression stockings, elevation activity.  I have recommended the patient utilize these activities if it prompt improvement of her symptoms.  2. Dizziness Today the patient's noninvasive study showed no evidence of significant carotid disease.  Based on this we do not find a vascular cause of her ongoing dizziness.   Current Outpatient Medications on File Prior to Visit  Medication Sig Dispense Refill   Acidophilus Lactobacillus CAPS Take 2 capsules by mouth in the morning and at bedtime.     atorvastatin  (LIPITOR) 20 MG tablet Take 20 mg by mouth every evening.     Calcium  Carb-Cholecalciferol (CALCIUM  + D3 PO) Take  1 tablet by mouth in the morning.     CALCIUM  MAGNESIUM  ZINC PO Take 1 tablet by mouth at bedtime.     Cholecalciferol (VITAMIN D) 125 MCG (5000 UT) CAPS Take 1 tablet by mouth daily.     COLLAGEN PO Take 2 Scoops by mouth in the morning.     estradiol (ESTRACE) 0.1 MG/GM vaginal cream Place 1 Applicatorful vaginally daily as needed (vaginal dryness.).     Fremanezumab-vfrm (AJOVY) 225 MG/1.5ML SOAJ Inject 225 mg into the skin every 30 (thirty) days.     metoprolol  tartrate (LOPRESSOR ) 25 MG tablet Take 1 tablet (25 mg total) by mouth 2 (two) times daily. You may take an extra tablet (25 mg) as needed for heart rate greater than 130. 200 tablet 3   Multiple Vitamins-Minerals (BARIATRIC MULTIVITAMINS/IRON PO) Take 1 tablet by mouth in the morning. BariatricPal Multivitamin     NURTEC 75 MG TBDP Take 75 mg by mouth daily as needed (migraine/headaches.).     nystatin (MYCOSTATIN/NYSTOP) powder Apply 1 application. topically daily. Applied to skin folds     VITAMIN A PO Take 2,400 mcg by mouth daily.     vitamin B-12 (CYANOCOBALAMIN ) 1000 MCG tablet Take 1,000 mcg by mouth in the morning.     vitamin E 180 MG (400 UNITS) capsule Take 400 Units by mouth daily.     Current  Facility-Administered Medications on File Prior to Visit  Medication Dose Route Frequency Provider Last Rate Last Admin   triamcinolone  acetonide (KENALOG ) 10 MG/ML injection 10 mg  10 mg Intradermal Once Kowalski, David C, MD        There are no Patient Instructions on file for this visit. No follow-ups on file.   Rashaun Wichert E Ahsan Esterline, NP

## 2024-05-12 ENCOUNTER — Emergency Department
Admission: EM | Admit: 2024-05-12 | Discharge: 2024-05-12 | Disposition: A | Discharge status: Home / Self Care | Attending: Emergency Medicine | Admitting: Emergency Medicine

## 2024-05-12 ENCOUNTER — Other Ambulatory Visit: Payer: Self-pay

## 2024-05-12 DIAGNOSIS — Z8673 Personal history of transient ischemic attack (TIA), and cerebral infarction without residual deficits: Secondary | ICD-10-CM | POA: Diagnosis not present

## 2024-05-12 DIAGNOSIS — I509 Heart failure, unspecified: Secondary | ICD-10-CM | POA: Insufficient documentation

## 2024-05-12 DIAGNOSIS — G43001 Migraine without aura, not intractable, with status migrainosus: Secondary | ICD-10-CM | POA: Diagnosis present

## 2024-05-12 DIAGNOSIS — R21 Rash and other nonspecific skin eruption: Secondary | ICD-10-CM | POA: Diagnosis not present

## 2024-05-12 DIAGNOSIS — I110 Hypertensive heart disease with heart failure: Secondary | ICD-10-CM | POA: Diagnosis not present

## 2024-05-12 LAB — CBC
HCT: 40.4 % (ref 36.0–46.0)
Hemoglobin: 13.1 g/dL (ref 12.0–15.0)
MCH: 29.4 pg (ref 26.0–34.0)
MCHC: 32.4 g/dL (ref 30.0–36.0)
MCV: 90.6 fL (ref 80.0–100.0)
Platelets: 252 K/uL (ref 150–400)
RBC: 4.46 MIL/uL (ref 3.87–5.11)
RDW: 13.2 % (ref 11.5–15.5)
WBC: 7.5 K/uL (ref 4.0–10.5)
nRBC: 0 % (ref 0.0–0.2)

## 2024-05-12 LAB — BASIC METABOLIC PANEL WITH GFR
Anion gap: 11 (ref 5–15)
BUN: 19 mg/dL (ref 6–20)
CO2: 27 mmol/L (ref 22–32)
Calcium: 9.2 mg/dL (ref 8.9–10.3)
Chloride: 101 mmol/L (ref 98–111)
Creatinine, Ser: 0.52 mg/dL (ref 0.44–1.00)
GFR, Estimated: >60 mL/min (ref >60)
Glucose, Bld: 104 mg/dL — ABNORMAL HIGH (ref 70–99)
Potassium: 4.2 mmol/L (ref 3.5–5.1)
Sodium: 139 mmol/L (ref 135–145)

## 2024-05-12 LAB — SEDIMENTATION RATE: Sed Rate: 7 mm/h (ref 0–30)

## 2024-05-12 MED ORDER — ONDANSETRON HCL 4 MG/2ML IJ SOLN
4.0000 mg | Freq: Once | INTRAMUSCULAR | Status: AC
Start: 2024-05-12 — End: 2024-05-12
  Administered 2024-05-12: 4 mg via INTRAVENOUS
  Filled 2024-05-12: qty 2

## 2024-05-12 MED ORDER — PROCHLORPERAZINE EDISYLATE 10 MG/2ML IJ SOLN
10.0000 mg | Freq: Once | INTRAMUSCULAR | Status: AC
  Administered 2024-05-12: 10 mg via INTRAVENOUS
  Filled 2024-05-12: qty 2

## 2024-05-12 MED ORDER — DEXAMETHASONE SODIUM PHOSPHATE 10 MG/ML IJ SOLN
10.0000 mg | Freq: Once | INTRAMUSCULAR | Status: AC
  Administered 2024-05-12: 10 mg via INTRAVENOUS
  Filled 2024-05-12: qty 1

## 2024-05-12 MED ORDER — BUTALBITAL-APAP-CAFFEINE 50-325-40 MG PO TABS
2.0000 | ORAL_TABLET | Freq: Once | ORAL | Status: AC
  Administered 2024-05-12: 2 via ORAL
  Filled 2024-05-12: qty 2

## 2024-05-12 MED ORDER — SODIUM CHLORIDE 0.9 % IV BOLUS
1000.0000 mL | Freq: Once | INTRAVENOUS | Status: AC
Start: 2024-05-12 — End: 2024-05-12
  Administered 2024-05-12: 1000 mL via INTRAVENOUS

## 2024-05-12 MED ORDER — PREDNISONE 10 MG (21) PO TBPK: ORAL_TABLET | ORAL | 0 refills | Status: DC

## 2024-05-12 MED ORDER — DIPHENHYDRAMINE HCL 50 MG/ML IJ SOLN
25.0000 mg | Freq: Once | INTRAMUSCULAR | Status: AC
  Administered 2024-05-12: 25 mg via INTRAVENOUS
  Filled 2024-05-12: qty 1

## 2024-05-12 MED ORDER — KETOROLAC TROMETHAMINE 30 MG/ML IJ SOLN
15.0000 mg | Freq: Once | INTRAMUSCULAR | Status: AC
  Administered 2024-05-12: 15 mg via INTRAVENOUS
  Filled 2024-05-12: qty 1

## 2024-05-12 NOTE — Discharge Instructions (Signed)
 Follow-up with your your doctor if not improving in 2 days.  Return emergency department if worsening.  Take medication as prescribed.  May also continue your regular migraine medication

## 2024-05-12 NOTE — ED Triage Notes (Signed)
 Pt has been having sharp migraines since Sunday. Pt has petechiae on legs bilaterally. States she had it before when she was on a blood thinner but isn't on one now. States she was recently at [place].

## 2024-05-12 NOTE — ED Provider Notes (Signed)
 [facility] Provider Note    Event Date/Time   First MD Initiated Contact with Patient 05/12/24 1611     (approximate)   History   Migraine   HPI  Michelle Norris is a 53 y.o. female history of [place] spotted fever, hypertension, CHF, CVA presents emergency department with cluster like headache around the left parietal area.  No slurred speech or weakness.  Patient states sudden onset a few days ago.  States continued to have a headache but it was not as severe.  Then she will get sharp stabbing pains in this area over and over.  States currently has a headache but the stabbing pain has subsided.  Was concerned because she has had a previous stroke.  Also noticed a lot of petechiae on the lower.  Is concerned as they drove to the [place]  [place] and were in the water.  States unsure if there could have been a bacteria that started this.  Patient states that he did not drive to the hallway in 1 day.  They did stop halfway and spend the night so that they can get out and move around.  She denies fever or chills.  Denies tick bite.  However [place]  States she has not seen a tick this year.      Physical Exam   Triage Vital Signs: ED Triage Vitals [05/12/24 1518]  Encounter Vitals Group     BP (!) 144/89     Girls Systolic BP Percentile      Girls Diastolic BP Percentile      Boys Systolic BP Percentile      Boys Diastolic BP Percentile      Pulse Rate 72     Resp 18     Temp 98.4 F (36.9 C)     Temp Source Oral     SpO2 100 %     Weight      Height      Head Circumference      Peak Flow      Pain Score 4     Pain Loc      Pain Education      Exclude from Growth Chart     Most recent vital signs: Vitals:   05/12/24 1518  BP: (!) 144/89  Pulse: 72  Resp: 18  Temp: 98.4 F (36.9 C)  SpO2: 100%     General: Awake, no distress.   CV:  Good peripheral perfusion. regular rate  and  rhythm Resp:  Normal effort. Lungs cta Abd:  No distention.   Other:  Cranial nerves II through XII grossly intact, patient is very alert able to answer all questions, grips equal bilaterally, skin with small red dots noted on the lower extremities mostly on the anterior surface, only an occasional 1 noted posteriorly.   ED Results / Procedures / Treatments   Labs (all labs ordered are listed, but only abnormal results are displayed) Labs Reviewed  BASIC METABOLIC PANEL WITH GFR - Abnormal; Notable for the following components:      Result Value   Glucose, Bld 104 (*)    All other components within normal limits  CBC  SEDIMENTATION RATE     EKG     RADIOLOGY     PROCEDURES:   Procedures  Critical Care:  no Chief Complaint  Patient presents with   Migraine      MEDICATIONS  ORDERED IN ED: Medications  sodium chloride  0.9 % bolus 1,000 mL (0 mLs Intravenous Stopped 05/12/24 1752)  dexamethasone  (DECADRON ) injection 10 mg (10 mg Intravenous Given 05/12/24 1706)  ondansetron  (ZOFRAN ) injection 4 mg (4 mg Intravenous Given 05/12/24 1707)  butalbital -acetaminophen -caffeine  (FIORICET ) 50-325-40 MG per tablet 2 tablet (2 tablets Oral Given 05/12/24 1747)  ketorolac  (TORADOL ) 30 MG/ML injection 15 mg (15 mg Intravenous Given 05/12/24 1747)  prochlorperazine  (COMPAZINE ) injection 10 mg (10 mg Intravenous Given 05/12/24 1747)  diphenhydrAMINE  (BENADRYL ) injection 25 mg (25 mg Intravenous Given 05/12/24 1747)     IMPRESSION / MDM / ASSESSMENT AND PLAN / ED COURSE  I reviewed the triage vital signs and the nursing notes.                              Differential diagnosis includes, but is not limited to, CVA, SAH, aneurysm,, migraine, meningitis, [facility] spotted fever, blood dyscrasia, bacterial infection, vasculitis  Patient's presentation is most consistent with acute illness / injury with system symptoms.   CBC and metabolic panel reassuring Did order sed rate  Will  do trial of medications to see if we can control the patient's symptoms as she does have a history of migraines.  Although she does state this migraine is different than she normally gets..  If she continues to have a headache we will then order imaging She has not had a fever or stiff neck so feel meningitis is less likely.  No tick exposure but still with consider [facility] spotted fever as a cause of headache and rash   Dr. Claudene and seen the patient.  Agrees with treatment plan.  Will give patient a course of steroids at discharge.  She is to follow-up with her regular doctor if not improving to 3 days.  Return emergency department if worsening headache.  She is in agreement treatment plan.  Discharged stable condition.  FINAL CLINICAL IMPRESSION(S) / ED DIAGNOSES   Final diagnoses:  Migraine without aura and with status migrainosus, not intractable     Rx / DC Orders   ED Discharge Orders          Ordered    predniSONE  (STERAPRED UNI-PAK 21 TAB) 10 MG (21) TBPK tablet        05/12/24 1836             Note:  This document was prepared using Dragon voice recognition software and may include unintentional dictation errors.    Gasper Devere ORN, PA-C 05/12/24 1844    Claudene Rover, MD 05/12/24 [phone]

## 2024-05-21 ENCOUNTER — Other Ambulatory Visit: Payer: Self-pay | Admitting: Family Medicine

## 2024-05-21 ENCOUNTER — Other Ambulatory Visit (INDEPENDENT_AMBULATORY_CARE_PROVIDER_SITE_OTHER): Payer: Self-pay | Admitting: Nurse Practitioner

## 2024-05-21 DIAGNOSIS — R252 Cramp and spasm: Secondary | ICD-10-CM

## 2024-05-21 DIAGNOSIS — R519 Headache, unspecified: Secondary | ICD-10-CM

## 2024-05-22 ENCOUNTER — Ambulatory Visit (INDEPENDENT_AMBULATORY_CARE_PROVIDER_SITE_OTHER): Admitting: Nurse Practitioner

## 2024-05-22 ENCOUNTER — Ambulatory Visit (INDEPENDENT_AMBULATORY_CARE_PROVIDER_SITE_OTHER)

## 2024-05-22 ENCOUNTER — Encounter (INDEPENDENT_AMBULATORY_CARE_PROVIDER_SITE_OTHER): Payer: Self-pay | Admitting: Nurse Practitioner

## 2024-05-22 VITALS — BP 102/68 | HR 63 | Resp 18 | Wt 201.4 lb

## 2024-05-22 DIAGNOSIS — I83813 Varicose veins of bilateral lower extremities with pain: Secondary | ICD-10-CM

## 2024-05-22 DIAGNOSIS — R252 Cramp and spasm: Secondary | ICD-10-CM | POA: Diagnosis not present

## 2024-05-25 ENCOUNTER — Encounter (INDEPENDENT_AMBULATORY_CARE_PROVIDER_SITE_OTHER): Payer: Self-pay | Admitting: Nurse Practitioner

## 2024-05-25 NOTE — Progress Notes (Signed)
 Subjective:    Patient ID: Michelle Norris, female    DOB: 1971-09-17, 53 y.o.   MRN: [id number] Chief Complaint  Patient presents with   Follow-up    Pt conv bilateral venous reflux    The patient returns today for evaluation of venous reflux studies to evaluate for possible causes of her ongoing leg cramps.  She notes that since her last visit the cramps have improved with use of medical grade compression stockings.  She has been utilizing these on a consistent basis.  She is also elevating her lower extremities is acting as well.  She also notes that she is increased her water intake as well as her electrolytes and that between all these interventions her cramping is certainly improved.  Today her noninvasive study showed no evidence of DVT or superficial phlebitis.  Evidence of deep venous insufficiency is present bilaterally.  There is also superficial venous reflux in the bilateral great saphenous veins.       Review of Systems  Cardiovascular:        Leg cramps  Neurological:  Positive for dizziness.  All other systems reviewed and are negative.      Objective:   Physical Exam Vitals reviewed.  HENT:     Head: Normocephalic.  Cardiovascular:     Rate and Rhythm: Normal rate.     Pulses: Normal pulses.  Pulmonary:     Effort: Pulmonary effort is normal.  Skin:    General: Skin is warm and dry.  Neurological:     Mental Status: She is alert and oriented to person, place, and time.  Psychiatric:        Mood and Affect: Mood normal.        Behavior: Behavior normal.        Thought Content: Thought content normal.        Judgment: Judgment normal.     BP 102/68   Pulse 63   Resp 18   Wt 201 lb 6.4 oz (91.4 kg)   BMI 30.62 kg/m   Past Medical History:  Diagnosis Date   Aortic atherosclerosis (HCC)    Atrial fibrillation (HCC)    a.) CHA2DS2VASc = 6 (sex, CHF, HTN, CVA x2, vascular disease history);  b.) s/p DCCV (150 J x1) 12/02/2017; c.) s/p Watchman device  placement 03/01/2022; d.) rate/rhythm maintained now without pharmacological intervention; chronically anticoagulated with clopidogrel    Atypical mole 08/03/2021   L sacral, moderate atypia   Bradycardia    CAD (coronary artery disease)    a.) LHC 10/15/2017: EF 35-45%, LVEDP norm, normal coronaries; b.) cCTA 08/10/2021: EF 67%. CAC score = 19.5 (90th percentile for age/sex match); c.) cCTA 05/01/2022: CAC score 13 (87th percentile for age/sex match)   Cardiomyopathy (HCC)    Cerebellar stroke (HCC) 10/18/2017   a.) MRI brain 10/18/2017: 5 mm acute infarct right superior cerebellum   CHF (congestive heart failure) (HCC)    Endometriosis    History of Roux-en-Y gastric bypass    Hyperlipidemia    Hypertension    Leukocytosis 07/2018   being monitored by pcp   Long term current use of antithrombotics/antiplatelets    a.) clopidogrel    Migraines    NICM (nonischemic cardiomyopathy) (HCC)    a.) TTE 09/17/2017: EF 30%; b.) MPI 09/20/2017: EF 45-50%; c.) LHC 10/15/2017: EF 35-45%; d.) TTE 03/23/2020: EF 45%; e.) TTE 05/08/2021: EF 50%; f.) cCTA 08/10/2021: EF 67%   NSVT (nonsustained ventricular tachycardia) (HCC) 03/26/2022   a.) Holter 03/26/2022 -  5 runs of NSVT with the longest lasting 18 beats   Obesity    OSA (obstructive sleep apnea)    a.) no nocturnal PAP therapy; has Inspire implant   Osteoarthritis    Peripheral edema    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   a.) s/p Watchman FLX 27 mm device placement   PSVT (paroxysmal supraventricular tachycardia) (HCC) 03/26/2022   a.) Holter 03/26/2022 - 4 runs of SVT with the longest lasting 19 beats   [facility] spotted fever 06/2018   bulls-eye rash on back   Vitamin B12 deficiency     Social History   Socioeconomic History   Marital status: Married    Spouse name: Dorise   Number of children: 4   Years of education: Not on file   Highest education level: Not on file  Occupational History    Occupation: unemployed  Tobacco Use   Smoking status: Never   Smokeless tobacco: Never  Vaping Use   Vaping status: Never Used  Substance and Sexual Activity   Alcohol use: Not Currently    Comment: occasional/ socially   Drug use: No   Sexual activity: Yes    Birth control/protection: Surgical  Other Topics Concern   Not on file  Social History Narrative   Not on file   Social Drivers of Health   Financial Resource Strain: Low Risk  (06/12/2023)   Received from [facility]   Overall Financial Resource Strain (CARDIA)    Difficulty of Paying Living Expenses: Not hard at all  Food Insecurity: No Food Insecurity (11/15/2023)   Hunger Vital Sign    Worried About Running Out of Food in the Last Year: Never true    Ran Out of Food in the Last Year: Never true  Transportation Needs: No Transportation Needs (11/15/2023)   PRAPARE - Administrator, Civil Service (Medical): No    Lack of Transportation (Non-Medical): No  Physical Activity: Unknown (09/03/2017)   Received from [facility]   Exercise Vital Sign    Days of Exercise per Week: Patient declined    Minutes of Exercise per Session: Patient declined  Stress: Unknown (09/03/2017)   Received from [facility] - Occupational Stress Questionnaire    Feeling of Stress : Patient declined  Social Connections: Unknown (09/03/2017)   Received from [facility]   Social Connection and Isolation Panel    Frequency of Communication with Friends and Family: Patient declined    Frequency of Social Gatherings with Friends and Family: Patient declined    Attends Religious Services: Patient declined    Active Member of Clubs or Organizations: Patient declined    Attends Banker Meetings: Patient declined    Marital Status: Patient declined  Intimate Partner Violence: Not At Risk (11/15/2023)    Humiliation, Afraid, Rape, and Kick questionnaire    Fear of Current or Ex-Partner: No    Emotionally Abused: No    Physically Abused: No    Sexually Abused: No    Past Surgical History:  Procedure Laterality Date   BICEPT TENODESIS Left 08/25/2018   Procedure: BICEPS TENODESIS;  Surgeon: Tobie Priest, MD;  Location: [facility] ORS;  Service: Orthopedics;  Laterality: Left;   BLADDER TACK  2013   WITH HYSTERECTOMY   CARDIOVERSION N/A 12/02/2017   Procedure: CARDIOVERSION;  Surgeon: Florencio Cara BIRCH, MD;  Location: [facility];  Service: Cardiovascular;  Laterality: N/A;   CARPAL TUNNEL RELEASE Right 09/26/2022   COLONOSCOPY WITH ESOPHAGOGASTRODUODENOSCOPY (EGD)  05/18/2020   ESOPHAGOGASTRODUODENOSCOPY  08/29/2020   INCISION AND DRAINAGE ABSCESS  08/07/2022   INSPIRE IMPLANT     LAPAROSCOPIC GASTRIC BYPASS  08/28/2020   LEFT ATRIAL APPENDAGE OCCLUSION N/A 03/01/2022   Procedure: LEFT ATRIAL APPENDAGE OCCLUSION;  Surgeon: Cindie Ole DASEN, MD;  Location: [facility]AS[facility] CV [facility];  Service: Cardiovascular;  Laterality: N/A;   LEFT HEART CATH AND CORONARY ANGIOGRAPHY Left 10/15/2017   Procedure: LEFT HEART CATH AND CORONARY ANGIOGRAPHY;  Surgeon: Florencio Cara BIRCH, MD;  Location: [facility] INVASIVE CV LAB;  Service: Cardiovascular;  Laterality: Left;   LEFT HEART CATH AND CORONARY ANGIOGRAPHY Left 10/15/2017   Procedure: LEFT HEART CATH AND CORONARY ANGIOGRAPHY; Location: [facility]; Surgeon: Margie Florencio, MD   ORIF TIBIA & FIBULA FRACTURES Right 2004   fall on right ; ANKLE FRACTURE   ROBOTIC ASSISTED LAPAROSCOPIC CHOLECYSTECTOMY  09/07/2022   SHOULDER ARTHROSCOPY WITH OPEN ROTATOR CUFF REPAIR Left 08/25/2018   Procedure: SHOULDER ARTHROSCOPY WITH SUBCROMIAL DECOMPRESSION  DISTAL CLAVICLE EXCISION VS.OPEN ROTATOR CUFF REPAIR;  Surgeon: Tobie Priest, MD;  Location: [facility];  Service: Orthopedics;  Laterality: Left;   SHOULDER ARTHROSCOPY WITH OPEN ROTATOR CUFF REPAIR Right 12/03/2022   Procedure: Right  shoulder revision mini open rotator cuff with Regeneten patch augmentation;  Surgeon: Tobie Priest, MD;  Location: [facility];  Service: Orthopedics;  Laterality: Right;   SHOULDER ARTHROSCOPY WITH SUBACROMIAL DECOMPRESSION AND OPEN ROTATOR C Right 08/28/2022   Procedure: Right shoulder arthroscopic rotator cuff repair, subacromial decompression, distal clavicle excision, and biceps tenodesis;  Surgeon: Tobie Priest, MD;  Location: [facility];  Service: Orthopedics;  Laterality: Right;   SUPRACERVICAL ABDOMINAL HYSTERECTOMY N/A 2013   TEE WITHOUT CARDIOVERSION N/A 03/01/2022   Procedure: TRANSESOPHAGEAL ECHOCARDIOGRAM (TEE);  Surgeon: Cindie Ole DASEN, MD;  Location: [facility] INVASIVE CV LAB;  Service: Cardiovascular;  Laterality: N/A;   TEMPOROMANDIBULAR JOINT ARTHROSCOPY Bilateral    1994   TUBAL LIGATION  1997    Family History  Problem Relation Age of Onset   Breast cancer Maternal Grandmother    Diabetes Maternal Grandmother    Dementia Maternal Grandmother    Atrial fibrillation Father    Hypertension Father    Congestive Heart Failure Father    Diabetes Paternal Grandmother    Heart attack Paternal Grandmother     Allergies  Allergen Reactions   Wasp Venom Shortness Of Breath and Swelling   Latex Swelling and Other (See Comments)    Burning, red, itchy, puffiness.    Amoxicillin Other (See Comments)    Yeast infection   Oxycodone  Itching    Tolerates hydrocodone  fine.   Tape Itching and Rash       Latest Ref Rng & Units 05/12/2024    3:20 PM 11/30/2022   10:24 AM 07/03/2022    4:35 PM  CBC  WBC 4.0 - 10.5 K/uL 7.5  5.3  6.5   Hemoglobin 12.0 - 15.0 g/dL 86.8  86.4  86.8   Hematocrit 36.0 - 46.0 % 40.4  41.1  40.4   Platelets 150 - 400 K/uL 252  239  264       CMP     Component Value Date/Time   NA 139 05/12/2024 1520   NA 139 04/16/2022 1143   K 4.2 05/12/2024 1520   CL 101 05/12/2024 1520   CO2 27 05/12/2024 1520   GLUCOSE 104 (H) 05/12/2024 1520   BUN 19  05/12/2024 1520  BUN 12 04/16/2022 1143   CREATININE 0.52 05/12/2024 1520   CALCIUM  9.2 05/12/2024 1520   PROT 7.5 07/03/2022 1635   ALBUMIN 4.4 07/03/2022 1635   AST 33 07/03/2022 1635   ALT 57 (H) 07/03/2022 1635   ALKPHOS 90 07/03/2022 1635   BILITOT 0.9 07/03/2022 1635   EGFR 111 04/16/2022 1143   GFRNONAA >60 05/12/2024 1520     No results found.     Assessment & Plan:   1. Varicose veins of bilateral lower extremities with pain (Primary) Recommend  I have reviewed my previous  discussion with the patient regarding  varicose veins and why they cause symptoms. Patient will continue  wearing graduated compression stockings class 1 on a daily basis, beginning first thing in the morning and removing them in the evening.  The patient is CEAP [id number].  The patient has been wearing compression for more than 12 weeks with no or little benefit.  The patient has been exercising daily for more than 12 weeks. The patient has been elevating and taking OTC pain medications for more than 12 weeks.  None of these have have eliminated the pain related to the varicose veins and venous reflux or the discomfort regarding venous congestion.    In addition, behavioral modification including elevation during the day was again discussed and this will continue.  The patient has utilized over the counter pain medications and has been exercising.  However, at this time conservative therapy has not alleviated the patient's symptoms of leg pain and swelling  Recommend: laser ablation of the right and  left great saphenous veins to eliminate the symptoms of pain and swelling of the lower extremities caused by the severe superficial venous reflux disease.     Current Outpatient Medications on File Prior to Visit  Medication Sig Dispense Refill   atorvastatin  (LIPITOR) 20 MG tablet Take 20 mg by mouth every evening.     butalbital -acetaminophen -caffeine  (FIORICET ) 50-325-40 MG tablet Take 1 tablet by  mouth.     Calcium  Carb-Cholecalciferol (CALCIUM  + D3 PO) Take 1 tablet by mouth in the morning.     CALCIUM  MAGNESIUM  ZINC PO Take 1 tablet by mouth at bedtime.     Cholecalciferol (VITAMIN D) 125 MCG (5000 UT) CAPS Take 1 tablet by mouth daily.     COLLAGEN PO Take 2 [place] by mouth in the morning.     estradiol (ESTRACE) 0.1 MG/GM vaginal cream Place 1 Applicatorful vaginally daily as needed (vaginal dryness.).     Fremanezumab-vfrm (AJOVY) 225 MG/1.5ML SOAJ Inject 225 mg into the skin every 30 (thirty) days.     metoprolol  tartrate (LOPRESSOR ) 25 MG tablet Take 1 tablet (25 mg total) by mouth 2 (two) times daily. You may take an extra tablet (25 mg) as needed for heart rate greater than 130. 200 tablet 3   Multiple Vitamins-Minerals (BARIATRIC MULTIVITAMINS/IRON PO) Take 1 tablet by mouth in the morning. BariatricPal Multivitamin     NURTEC 75 MG TBDP Take 75 mg by mouth daily as needed (migraine/headaches.).     nystatin (MYCOSTATIN/NYSTOP) powder Apply 1 application. topically daily. Applied to skin folds     predniSONE  (STERAPRED UNI-PAK 21 TAB) 10 MG (21) TBPK tablet Take 6 pills on day one then decrease by 1 pill each day 21 tablet 0   VITAMIN A PO Take 2,400 mcg by mouth daily.     vitamin B-12 (CYANOCOBALAMIN ) 1000 MCG tablet Take 1,000 mcg by mouth in the morning.     vitamin E  180 MG (400 UNITS) capsule Take 400 Units by mouth daily.     Acidophilus Lactobacillus CAPS Take 2 capsules by mouth in the morning and at bedtime.     Current Facility-Administered Medications on File Prior to Visit  Medication Dose Route Frequency Provider Last Rate Last Admin   triamcinolone  acetonide (KENALOG ) 10 MG/ML injection 10 mg  10 mg Intradermal Once Kowalski, David C, MD        There are no Patient Instructions on file for this visit. No follow-ups on file.   Evalina Tabak E Yanet Balliet, NP

## 2024-05-28 ENCOUNTER — Ambulatory Visit
Admission: RE | Admit: 2024-05-28 | Discharge: 2024-05-28 | Disposition: A | Discharge status: Home / Self Care | Source: Ambulatory Visit | Attending: Family Medicine | Admitting: Family Medicine

## 2024-05-28 ENCOUNTER — Ambulatory Visit: Payer: No Typology Code available for payment source | Admitting: Dermatology

## 2024-05-28 DIAGNOSIS — R519 Headache, unspecified: Secondary | ICD-10-CM | POA: Diagnosis present

## 2024-06-02 ENCOUNTER — Ambulatory Visit: Admitting: Dermatology

## 2024-06-02 ENCOUNTER — Encounter: Payer: Self-pay | Admitting: Dermatology

## 2024-06-02 DIAGNOSIS — W908XXA Exposure to other nonionizing radiation, initial encounter: Secondary | ICD-10-CM

## 2024-06-02 DIAGNOSIS — L910 Hypertrophic scar: Secondary | ICD-10-CM | POA: Diagnosis not present

## 2024-06-02 DIAGNOSIS — Z79899 Other long term (current) drug therapy: Secondary | ICD-10-CM

## 2024-06-02 DIAGNOSIS — L578 Other skin changes due to chronic exposure to nonionizing radiation: Secondary | ICD-10-CM | POA: Diagnosis not present

## 2024-06-02 DIAGNOSIS — Z7189 Other specified counseling: Secondary | ICD-10-CM

## 2024-06-02 DIAGNOSIS — Z1283 Encounter for screening for malignant neoplasm of skin: Secondary | ICD-10-CM | POA: Diagnosis not present

## 2024-06-02 DIAGNOSIS — L719 Rosacea, unspecified: Secondary | ICD-10-CM

## 2024-06-02 DIAGNOSIS — L821 Other seborrheic keratosis: Secondary | ICD-10-CM

## 2024-06-02 DIAGNOSIS — L814 Other melanin hyperpigmentation: Secondary | ICD-10-CM

## 2024-06-02 DIAGNOSIS — D229 Melanocytic nevi, unspecified: Secondary | ICD-10-CM

## 2024-06-02 DIAGNOSIS — L820 Inflamed seborrheic keratosis: Secondary | ICD-10-CM

## 2024-06-02 DIAGNOSIS — Z86018 Personal history of other benign neoplasm: Secondary | ICD-10-CM

## 2024-06-02 DIAGNOSIS — D1801 Hemangioma of skin and subcutaneous tissue: Secondary | ICD-10-CM

## 2024-06-02 DIAGNOSIS — L299 Pruritus, unspecified: Secondary | ICD-10-CM

## 2024-06-02 MED ORDER — SAFETY SEAL MISCELLANEOUS MISC
1.0000 | 11 refills | Status: AC
Start: 2024-06-02 — End: ?

## 2024-06-02 NOTE — Patient Instructions (Addendum)
 Your provider has sent your prescription to [facility] in [place], [place] . A pharmacy representative will call you to confirm details and take your payment information. If you do not receive a call within 24 hours, please contact the pharmacy at [phone] or [phone]. Your unique skincare compound is being formulated in our lab (most compounds take less than 24 hours). Your prescription is shipped vis USPS to your mailbox (2-4 business days). Priority shipping is available at an additional cost. Once received, you will electronically sign/acknowledge that you received your prescription. The pharmacy hours are Monday-Friday 9 am-6 pm EST and Saturday 9 am-1 pm EST.     Seborrheic Keratosis  What causes seborrheic keratoses? Seborrheic keratoses are harmless, common skin growths that first appear during adult life.  As time goes by, more growths appear.  Some people may develop a large number of them.  Seborrheic keratoses appear on both covered and uncovered body parts.  They are not caused by sunlight.  The tendency to develop seborrheic keratoses can be inherited.  They vary in color from skin-colored to gray, brown, or even black.  They can be either smooth or have a rough, warty surface.   Seborrheic keratoses are superficial and look as if they were stuck on the skin.  Under the microscope this type of keratosis looks like layers upon layers of skin.  That is why at times the top layer may seem to fall off, but the rest of the growth remains and re-grows.    Treatment Seborrheic keratoses do not need to be treated, but can easily be removed in the office.  Seborrheic keratoses often cause symptoms when they rub on clothing or jewelry.  Lesions can be in the way of shaving.  If they become inflamed, they can cause itching, soreness, or burning.  Removal of a seborrheic keratosis can be accomplished by freezing, burning, or surgery. If any spot bleeds, scabs, or grows rapidly, please  return to have it checked, as these can be an indication of a skin cancer.   Cryotherapy Aftercare  Wash gently with soap and water everyday.   Apply Vaseline and Band-Aid daily until healed.    Melanoma ABCDEs  Melanoma is the most dangerous type of skin cancer, and is the leading cause of death from skin disease.  You are more likely to develop melanoma if you: Have light-colored skin, light-colored eyes, or red or blond hair Spend a lot of time in the sun Tan regularly, either outdoors or in a tanning bed Have had blistering sunburns, especially during childhood Have a close family member who has had a melanoma Have atypical moles or large birthmarks  Early detection of melanoma is key since treatment is typically straightforward and cure rates are extremely high if we catch it early.   The first sign of melanoma is often a change in a mole or a new dark spot.  The ABCDE system is a way of remembering the signs of melanoma.  A for asymmetry:  The two halves do not match. B for border:  The edges of the growth are irregular. C for color:  A mixture of colors are present instead of an even brown color. D for diameter:  Melanomas are usually (but not always) greater than 6mm - the size of a pencil eraser. E for evolution:  The spot keeps changing in size, shape, and color.  Please check your skin once per month between visits. You can use a small mirror in front and  a large mirror behind you to keep an eye on the back side or your body.   If you see any new or changing lesions before your next follow-up, please call to schedule a visit.  Please continue daily skin protection including broad spectrum sunscreen SPF 30+ to sun-exposed areas, reapplying every 2 hours as needed when you're outdoors.   Staying in the shade or wearing long sleeves, sun glasses (UVA+UVB protection) and wide brim hats (4-inch brim around the entire circumference of the hat) are also recommended for sun  protection.      Due to recent changes in healthcare laws, you may see results of your pathology and/or laboratory studies on MyChart before the doctors have had a chance to review them. We understand that in some cases there may be results that are confusing or concerning to you. Please understand that not all results are received at the same time and often the doctors may need to interpret multiple results in order to provide you with the best plan of care or course of treatment. Therefore, we ask that you please give us  2 business days to thoroughly review all your results before contacting the office for clarification. Should we see a critical lab result, you will be contacted sooner.   If You Need Anything After Your Visit  If you have any questions or concerns for your doctor, please call our main line at [phone] and press option 4 to reach your doctor's medical assistant. If no one answers, please leave a voicemail as directed and we will return your call as soon as possible. Messages left after 4 pm will be answered the following business day.   You may also send us  a message via MyChart. We typically respond to MyChart messages within 1-2 business days.  For prescription refills, please ask your pharmacy to contact our office. Our fax number is [phone].  If you have an urgent issue when the clinic is closed that cannot wait until the next business day, you can page your doctor at the number below.    Please note that while we do our best to be available for urgent issues outside of office hours, we are not available 24/7.   If you have an urgent issue and are unable to reach us , you may choose to seek medical care at your doctor's office, retail clinic, urgent care center, or emergency room.  If you have a medical emergency, please immediately call 911 or go to the emergency department.  Pager Numbers  - Dr. Hester: [phone]  - Dr. Jackquline: [phone]  - Dr.  Claudene: [phone]   - Dr. Raymund: [phone]  In the event of inclement weather, please call our main line at [phone] for an update on the status of any delays or closures.  Dermatology Medication Tips: Please keep the boxes that topical medications come in in order to help keep track of the instructions about where and how to use these. Pharmacies typically print the medication instructions only on the boxes and not directly on the medication tubes.   If your medication is too expensive, please contact our office at [phone] option 4 or send us  a message through MyChart.   We are unable to tell what your co-pay for medications will be in advance as this is different depending on your insurance coverage. However, we may be able to find a substitute medication at lower cost or fill out paperwork to get insurance to cover a needed medication.  If a prior authorization is required to get your medication covered by your insurance company, please allow us  1-2 business days to complete this process.  Drug prices often vary depending on where the prescription is filled and some pharmacies may offer cheaper prices.  The website www.goodrx.com contains coupons for medications through different pharmacies. The prices here do not account for what the cost may be with help from insurance (it may be cheaper with your insurance), but the website can give you the price if you did not use any insurance.  - You can print the associated coupon and take it with your prescription to the pharmacy.  - You may also stop by our office during regular business hours and pick up a GoodRx coupon card.  - If you need your prescription sent electronically to a different pharmacy, notify our office through [facility] or by phone at [phone] option 4.     Si Usted Necesita Algo Despus de Su Visita  Tambin puede enviarnos un mensaje a travs de Clinical cytogeneticist. Por lo general respondemos a los mensajes  de MyChart en el transcurso de 1 a 2 das hbiles.  Para renovar recetas, por favor pida a su farmacia que se ponga en contacto con nuestra oficina. Randi lakes de fax es [place] [phone].  Si tiene un asunto urgente cuando la clnica est cerrada y que no puede esperar hasta el siguiente da hbil, puede llamar/localizar a su doctor(a) al nmero que aparece a continuacin.   Por favor, tenga en cuenta que aunque hacemos todo lo posible para estar disponibles para asuntos urgentes fuera del horario de [place], no estamos disponibles las 24 horas del da, los 7 [address]  [address] de la [place].   Si tiene un problema urgente y no puede comunicarse con nosotros, puede optar por buscar atencin mdica  en el consultorio de su doctor(a), en una clnica privada, en un centro de atencin urgente o en una sala de emergencias.  Si tiene Engineer, drilling, por favor llame inmediatamente al 911 o vaya a la sala de emergencias.  Nmeros de bper  - Dr. Hester: [phone]  - Dra. Jackquline: [phone]  - Dr. Claudene: [phone]  - Dra. Kitts: [phone]  En caso de inclemencias del [place], por favor llame a nuestra lnea principal al [phone] para una actualizacin sobre el estado de cualquier retraso o cierre.  Consejos para la medicacin en dermatologa: Por favor, guarde las cajas en las que vienen los medicamentos de uso tpico para ayudarle a seguir las instrucciones sobre dnde y cmo usarlos. Las farmacias generalmente imprimen las instrucciones del medicamento slo en las cajas y no directamente en los tubos del [place].   Si su medicamento es muy caro, por favor, pngase en contacto con landry rieger llamando al [phone] y presione la opcin 4 o envenos un mensaje a travs de Clinical cytogeneticist.   No podemos decirle cul ser su copago por los medicamentos por adelantado ya que esto es diferente dependiendo de la cobertura de su seguro. Sin embargo, es posible que podamos encontrar un  medicamento sustituto a Audiological scientist un formulario para que el seguro cubra el medicamento que se considera necesario.   Si se requiere una autorizacin previa para que su compaa de seguros malta su medicamento, por favor permtanos de 1 a 2 das hbiles para completar este proceso.  Los precios de los medicamentos varan con frecuencia dependiendo del Environmental consultant de dnde se surte la receta y alguna farmacias pueden ofrecer precios ms baratos.  El sitio web www.goodrx.com tiene cupones para medicamentos de Health and safety inspector. Los precios aqu no tienen en cuenta lo que podra costar con la ayuda del seguro (puede ser ms barato con su seguro), pero el sitio web puede darle el precio si no utiliz Tourist information centre manager.  - Puede imprimir el cupn correspondiente y llevarlo con su receta a la farmacia.  - Tambin puede pasar por nuestra oficina durante el horario de atencin regular y Education officer, museum una tarjeta de cupones de GoodRx.  - Si necesita que su receta se enve electrnicamente a una farmacia diferente, informe a nuestra oficina a travs de MyChart de [place] o por telfono llamando al [phone] y presione la opcin 4.

## 2024-06-02 NOTE — Progress Notes (Signed)
 Follow-Up Visit   Subjective  Michelle Norris is a 53 y.o. female who presents for the following: Skin Cancer Screening and Full Body Skin Exam Hx of isks , hx of dysplastic nevi  Still itchy on back Spot at right shoulder is still swollen  Hx of rosacea   The patient presents for Total-Body Skin Exam (TBSE) for skin cancer screening and mole check. The patient has spots, moles and lesions to be evaluated, some may be new or changing and the patient may have concern these could be cancer.  The following portions of the chart were reviewed this encounter and updated as appropriate: medications, allergies, medical history  Review of Systems:  No other skin or systemic complaints except as noted in HPI or Assessment and Plan.  Objective  Well appearing patient in no apparent distress; mood and affect are within normal limits.  A full examination was performed including scalp, head, eyes, ears, nose, lips, neck, chest, axillae, abdomen, back, buttocks, bilateral upper extremities, bilateral lower extremities, hands, feet, fingers, toes, fingernails, and toenails. All findings within normal limits unless otherwise noted below.   Relevant physical exam findings are noted in the Assessment and Plan.  face x 16 (16) Erythematous stuck-on, waxy papule or plaque   Assessment & Plan   ROSACEA Exam Mid face erythema with telangiectasias  Chronic and persistent condition with duration or expected duration over one year. Condition is symptomatic/ bothersome to patient. Not currently at goal. Rosacea is a chronic progressive skin condition usually affecting the face of adults, causing redness and/or acne bumps. It is treatable but not curable. It sometimes affects the eyes (ocular rosacea) as well. It may respond to topical and/or systemic medication and can flare with stress, sun exposure, alcohol, exercise, topical steroids (including hydrocortisone/cortisone 10) and some foods.  Daily  application of broad spectrum spf 30+ sunscreen to face is recommended to reduce flares.  Patient denies grittiness of the eyes   Treatment Plan Start Rosacea Extra Cream - use topically to face in morning for rosacea Oxymetazoline USP 0.8 % Metronidazole  USP 1 % Nicotinamide USP 3% Ivermectin  USP 1 % Azelaic Acid Excp 7.5 %   Your provider has sent your prescription to [facility] in [place], [place] . A pharmacy representative will call you to confirm details and take your payment information. If you do not receive a call within 24 hours, please contact the pharmacy at [phone] or [phone]. Your unique skincare compound is being formulated in our lab (most compounds take less than 24 hours). Your prescription is shipped vis USPS to your mailbox (2-4 business days). Priority shipping is available at an additional cost. Once received, you will electronically sign/acknowledge that you received your prescription. The pharmacy hours are Monday-Friday 9 am-6 pm EST and Saturday 9 am-1 pm EST.   Also Discussed  Counseling for BBL / IPL / Laser and Coordination of Care Discussed the treatment option of Broad Band Light (BBL) /Intense Pulsed Light (IPL)/ Laser for skin discoloration, including brown spots and redness.  Typically we recommend at least 1-3 treatment sessions about 5-8 weeks apart for best results.  Cannot have tanned skin when BBL performed, and regular use of sunscreen/photoprotection is advised after the procedure to help maintain results. The patient's condition may also require maintenance treatments in the future.  The fee for BBL / laser treatments is $350 per treatment session for the whole face.  A fee can be quoted for other parts of the body.  Insurance typically  does not pay for BBL/laser treatments and therefore the fee is an out-of-pocket cost. Recommend prophylactic valtrex treatment. Once scheduled for procedure, will send Rx in prior to patient's  appointment.    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 Benign-appearing.  Call clinic for new or changing lesions.  Treatment Plan:  Treat with intralesional triamcinolone .  Procedure Note Intralesional Injection Informed Consent: Discussed risks (infection, pain, bleeding, bruising, thinning of the skin, loss of skin pigment, lack of resolution, and recurrence of lesion) and benefits of the procedure, as well as the alternatives. Informed consent was obtained. Preparation: The area was prepared a standard fashion. Procedure Details: An intralesional injection was performed with Kenalog  10 mg/cc. 0.3 cc in total were injected. [facility] #: [id number] Exp: June 2027 Location: right posterior shoulder Total number of injections: 1 The patient was instructed on post-op care. Recommend OTC analgesia as needed for pain.   SKIN CANCER SCREENING PERFORMED TODAY.  ACTINIC DAMAGE - Chronic condition, secondary to cumulative UV/sun exposure - diffuse scaly erythematous macules with underlying dyspigmentation - Recommend daily broad spectrum sunscreen SPF 30+ to sun-exposed areas, reapply every 2 hours as needed.  - Staying in the shade or wearing long sleeves, sun glasses (UVA+UVB protection) and wide brim hats (4-inch brim around the entire circumference of the hat) are also recommended for sun protection.  - Call for new or changing lesions.  LENTIGINES, SEBORRHEIC KERATOSES, HEMANGIOMAS - Benign normal skin lesions - Benign-appearing - Call for any changes  MELANOCYTIC NEVI - Tan-brown and/or pink-flesh-colored symmetric macules and papules - Benign appearing on exam today - Observation - Call clinic for new or changing moles - Recommend daily use of broad spectrum spf 30+ sunscreen to sun-exposed areas.    HISTORY OF DYSPLASTIC NEVUS 08/03/2021 left  sacral - moderate atypia  No evidence of recurrence today Recommend regular full body skin exams Recommend daily broad spectrum sunscreen SPF 30+ to sun-exposed areas, reapply every 2 hours as needed.  Call if any new or changing lesions are noted between office visits  INFLAMED SEBORRHEIC KERATOSIS (16) face x 16 (16) Symptomatic, irritating, patient would like treated. Destruction of lesion - face x 16 (16) Complexity: simple   Destruction method: cryotherapy   Informed consent: discussed and consent obtained   Timeout:  patient name, date of birth, surgical site, and procedure verified Lesion destroyed using liquid nitrogen: Yes   Region frozen until ice ball extended beyond lesion: Yes   Outcome: patient tolerated procedure well with no complications   Post-procedure details: wound care instructions given    ROSACEA   Related Medications Safety Seal Miscellaneous MISC Apply 1 Application topically every morning. Rosacea Extra Ceam Oxymetazoline USP 0.8 % Metronidazole  USP 1 % Nicotinamide USP 3 % Ivermectin  USP 1% Azelaic Acid EXCP 7.5 % Return in about 1 year (around 06/02/2025) for TBSE.  IEleanor Blush, CMA, am acting as scribe for Alm Rhyme, MD.   Documentation: I have reviewed the above documentation for accuracy and completeness, and I agree with the above.  Alm Rhyme, MD

## 2024-06-04 ENCOUNTER — Other Ambulatory Visit: Payer: Self-pay | Admitting: Neurology

## 2024-06-04 DIAGNOSIS — G500 Trigeminal neuralgia: Secondary | ICD-10-CM

## 2024-06-13 ENCOUNTER — Ambulatory Visit
Admission: RE | Admit: 2024-06-13 | Discharge: 2024-06-13 | Disposition: A | Discharge status: Home / Self Care | Source: Ambulatory Visit | Attending: Neurology | Admitting: Neurology

## 2024-06-13 DIAGNOSIS — G500 Trigeminal neuralgia: Secondary | ICD-10-CM | POA: Diagnosis present

## 2024-06-13 MED ORDER — GADOBUTROL 1 MMOL/ML IV SOLN
9.0000 mL | Freq: Once | INTRAVENOUS | Status: AC | PRN
Start: 2024-06-13 — End: 2024-06-13
  Administered 2024-06-13: 9 mL via INTRAVENOUS

## 2024-06-15 ENCOUNTER — Ambulatory Visit: Admitting: Dermatology

## 2024-06-16 ENCOUNTER — Telehealth (INDEPENDENT_AMBULATORY_CARE_PROVIDER_SITE_OTHER): Payer: Self-pay | Admitting: Vascular Surgery

## 2024-06-16 ENCOUNTER — Other Ambulatory Visit: Payer: Self-pay | Admitting: Physician Assistant

## 2024-06-16 DIAGNOSIS — Z1231 Encounter for screening mammogram for malignant neoplasm of breast: Secondary | ICD-10-CM

## 2024-06-16 NOTE — Telephone Encounter (Signed)
 Spoke with Michelle Norris to schedule laser ablation with Dr. Jama MANIFOLD 2- Michelle Norris was driving and will call back to schedule.  left leg GSV laser. see gs. shara #[id number] exp: 9.15.25 - 3.8.26 - 2 units   1 week post left leg GSV laser   right leg GSV laser. see gs. shara #[id number] exp: 9.15.25 - 3.8.26   1 week post right leg GSV laser.   4 and 8 week post GSV laser. see fb/bp/gs

## 2024-06-23 ENCOUNTER — Other Ambulatory Visit: Payer: Self-pay | Admitting: Family Medicine

## 2024-06-23 DIAGNOSIS — M5412 Radiculopathy, cervical region: Secondary | ICD-10-CM

## 2024-07-01 NOTE — Discharge Instructions (Signed)

## 2024-07-02 ENCOUNTER — Ambulatory Visit
Admission: RE | Admit: 2024-07-02 | Discharge: 2024-07-02 | Disposition: A | Discharge status: Home / Self Care | Source: Ambulatory Visit | Attending: Family Medicine | Admitting: Family Medicine

## 2024-07-02 DIAGNOSIS — M5412 Radiculopathy, cervical region: Secondary | ICD-10-CM

## 2024-07-02 MED ORDER — IOPAMIDOL (ISOVUE-M 300) INJECTION 61%
1.0000 mL | Freq: Once | INTRAMUSCULAR | Status: AC | PRN
  Administered 2024-07-02: 1 mL via EPIDURAL

## 2024-07-02 MED ORDER — TRIAMCINOLONE ACETONIDE 40 MG/ML IJ SUSP (RADIOLOGY)
60.0000 mg | Freq: Once | INTRAMUSCULAR | Status: AC
  Administered 2024-07-02: 60 mg via EPIDURAL

## 2024-08-04 NOTE — Progress Notes (Unsigned)
  Electrophysiology Office Follow up Visit Note:    Date:  08/05/2024   ID:  Michelle Norris, DOB 14-Jul-1971, MRN [id number]  PCP:  Michelle Eva POUR, PA  [facility] Cardiologist:  None  [facility] Electrophysiologist:  OLE ONEIDA HOLTS, MD    Interval History:     Michelle Norris is a 53 y.o. female who presents for a follow up visit.   The patient had a watchman implanted in 2023.  Follow-up imaging in July 2023 showed successful closure of the left atrial appendage.  She saw Dr. Florencio May 29, 2024.  Today she tells me she is having frequent episodes of palpitations.  She will experience rapid heart rates up to 150 or 100 60 bpm followed by slower heart rates in the 40s and 50s.  Sometimes this lower heart rates are accompanied by lightheadedness and dizziness episodes.  She continues to take metoprolol .  She is off anticoagulation given history of watchman implant.  Watchman was implanted because of anticoagulation absorption concerns given history of gastric bypass surgery.      Past medical, surgical, social and family history were reviewed.  ROS:   Please see the history of present illness.    All other systems reviewed and are negative.  EKGs/Labs/Other Studies Reviewed:    The following studies were reviewed today:          Physical Exam:    VS:  BP 112/80   Pulse 94   Ht 5' 8 (1.727 m)   Wt 200 lb (90.7 kg)   SpO2 95%   BMI 30.41 kg/m     Wt Readings from Last 3 Encounters:  08/05/24 200 lb (90.7 kg)  05/22/24 201 lb 6.4 oz (91.4 kg)  04/16/24 198 lb 3.2 oz (89.9 kg)     GEN: no distress CARD: RRR, No MRG RESP: No IWOB. CTAB.      ASSESSMENT:    1. Atrial fibrillation, unspecified type (HCC)   2. Presence of Watchman left atrial appendage closure device    PLAN:    In order of problems listed above:  #Paroxysmal atrial fibrillation #Palpitations The patient is experiencing intermittent episodes of palpitations.  She also  has episodes where she feels lightheaded and dizzy.  The spells are accompanied by lower heart rates.  I am wondering if she is having worsening paroxysmal atrial fibrillation and tachycardia-bradycardia syndrome.  To better understand her underlying arrhythmia, I am going to order a 2-week ZIO monitor.  #Watchman device in situ Watchman was well-seated with successful closure of the left atrial appendage on follow-up imaging in July 2023.    Follows with [facility] clinic, Dr. Florencio.  I discussed my upcoming departure from [facility].  She will transition her care to one of my partners, Dr. Kennyth.  Follow-up with Elvie in 6 to 8 weeks  Signed, Ole Holts, MD, [facility], [facility] 08/05/2024 1:26 PM    Electrophysiology [facility]

## 2024-08-05 ENCOUNTER — Ambulatory Visit: Attending: Cardiology | Admitting: Cardiology

## 2024-08-05 ENCOUNTER — Ambulatory Visit

## 2024-08-05 VITALS — BP 112/80 | HR 94 | Ht 68.0 in | Wt 200.0 lb

## 2024-08-05 DIAGNOSIS — I4891 Unspecified atrial fibrillation: Secondary | ICD-10-CM | POA: Diagnosis not present

## 2024-08-05 DIAGNOSIS — Z95818 Presence of other cardiac implants and grafts: Secondary | ICD-10-CM

## 2024-08-05 NOTE — Patient Instructions (Signed)
 Medication Instructions:  Your physician recommends that you continue on your current medications as directed. Please refer to the Current Medication list given to you today.  *If you need a refill on your cardiac medications before your next appointment, please call your pharmacy*  Testing/Procedures: Event Monitor Your physician has recommended that you wear an event monitor. Event monitors are medical devices that record the heart's electrical activity. Doctors most often us  these monitors to diagnose arrhythmias. Arrhythmias are problems with the speed or rhythm of the heartbeat. The monitor is a small, portable device. You can wear one while you do your normal daily activities. This is usually used to diagnose what is causing palpitations/syncope (passing out).   Follow-Up: At [facility], you and your health needs are our priority.  As part of our continuing mission to provide you with exceptional heart care, our providers are all part of one team.  This team includes your primary Cardiologist (physician) and Advanced Practice Providers or APPs (Physician Assistants and Nurse Practitioners) who all work together to provide you with the care you need, when you need it.  Your next appointment:   6-8 weeks  Provider:   Suzann Riddle, NP     ZIO XT- Long Term Monitor Instructions  Your physician has requested you wear a ZIO patch monitor for 14 days.  This is a single patch monitor. [employer] supplies one patch monitor per enrollment. Additional stickers are not available. Please do not apply patch if you will be having a Nuclear Stress Test,  Echocardiogram, Cardiac CT, MRI, or Chest Xray during the period you would be wearing the  monitor. The patch cannot be worn during these tests. You cannot remove and re-apply the  ZIO XT patch monitor.  Your ZIO patch monitor will be mailed 3 day USPS to your address on file. It may take 3-5 days  to receive your monitor after you have  been enrolled.  Once you have received your monitor, please review the enclosed instructions. Your monitor  has already been registered assigning a specific monitor serial # to you.  Billing and Patient Assistance Program Information  We have supplied [employer] with any of your insurance information on file for billing purposes. [employer] offers a sliding scale Patient Assistance Program for patients that do not have  insurance, or whose insurance does not completely cover the cost of the ZIO monitor.  You must apply for the Patient Assistance Program to qualify for this discounted rate.  To apply, please call [employer] at [phone], select option 4, select option 2, ask to apply for  Patient Assistance Program. Meredeth will ask your household income, and how many people  are in your household. They will quote your out-of-pocket cost based on that information.  [employer] will also be able to set up a 40-month, interest-free payment plan if needed.  Applying the monitor   Shave hair from upper left chest.  Hold abrader disc by orange tab. Rub abrader in 40 strokes over the upper left chest as  indicated in your monitor instructions.  Clean area with 4 enclosed alcohol pads. Let dry.  Apply patch as indicated in monitor instructions. Patch will be placed under collarbone on left  side of chest with arrow pointing upward.  Rub patch adhesive wings for 2 minutes. Remove white label marked 1. Remove the white  label marked 2. Rub patch adhesive wings for 2 additional minutes.  While looking in a mirror, press and release button in center of  patch. A small green light will  flash 3-4 times. This will be your only indicator that the monitor has been turned on.  Do not shower for the first 24 hours. You may shower after the first 24 hours.  Press the button if you feel a symptom. You will hear a small click. Record Date, Time and  Symptom in the Patient Logbook.  When you are ready to remove the  patch, follow instructions on the last 2 pages of Patient  Logbook. Stick patch monitor onto the last page of Patient Logbook.  Place Patient Logbook in the blue and white box. Use locking tab on box and tape box closed  securely. The blue and white box has prepaid postage on it. Please place it in the mailbox as  soon as possible. Your physician should have your test results approximately 7 days after the  monitor has been mailed back to [facility].  Call [facility] Customer Care at [phone] if you have questions regarding  your ZIO XT patch monitor. Call them immediately if you see an orange light blinking on your  monitor.  If your monitor falls off in less than 4 days, contact our Monitor department at [phone].  If your monitor becomes loose or falls off after 4 days call [employer] at [phone] for  suggestions on securing your monitor

## 2024-08-18 ENCOUNTER — Ambulatory Visit
Admission: RE | Admit: 2024-08-18 | Discharge: 2024-08-18 | Disposition: A | Discharge status: Home / Self Care | Source: Ambulatory Visit | Attending: Physician Assistant | Admitting: Physician Assistant

## 2024-08-18 DIAGNOSIS — Z1231 Encounter for screening mammogram for malignant neoplasm of breast: Secondary | ICD-10-CM | POA: Insufficient documentation

## 2024-08-25 ENCOUNTER — Telehealth (INDEPENDENT_AMBULATORY_CARE_PROVIDER_SITE_OTHER): Payer: Self-pay | Admitting: Vascular Surgery

## 2024-08-25 NOTE — Telephone Encounter (Signed)
 Called pt 2x and LVM to schedule laser, asked pt to give us  a call back to schedule or to let us  know if she wants to schedule these appts.   left leg GSV laser. see gs. shara #[id number] exp: 9.15.25 - 3.8.26 - 2 units    1 week post left leg GSV laser    right leg GSV laser. see gs. shara #[id number] exp: 9.15.25 - 3.8.26    1 week post right leg GSV laser.    4 and 8 week post GSV laser. see fb/gs

## 2024-08-31 DIAGNOSIS — Z95818 Presence of other cardiac implants and grafts: Secondary | ICD-10-CM

## 2024-08-31 DIAGNOSIS — I4891 Unspecified atrial fibrillation: Secondary | ICD-10-CM

## 2024-09-01 ENCOUNTER — Ambulatory Visit: Payer: Self-pay | Admitting: Cardiology

## 2024-09-22 ENCOUNTER — Telehealth: Payer: Self-pay

## 2024-09-22 NOTE — Telephone Encounter (Signed)
 I spoke with patient in regards to an update from Suzann Riddle, NP.  Patient saw Dr. Cindie recently for tachypalpitations where they placed a  zio that showed rare PVCs, no afib. Given the reassuring monitor, it would be reasonable to follow-up with her General Cardiologist. Patient prefers to keep the appointment to discuss increased and painful PVCs, dizziness and syncope. Suzann Riddle, NP notified.

## 2024-09-22 NOTE — Progress Notes (Unsigned)
 Electrophysiology Clinic Note    Date:  09/23/2024  Patient ID:  Velta, Rockholt 10-Nov-1970, MRN [id number] PCP:  Marikay Eva POUR, PA  Cardiologist:  Cara JONETTA Lovelace, MD  Electrophysiologist:  OLE ONEIDA HOLTS, MD  Electrophysiology APP:  Emika Tiano, NP     Discussed the use of AI scribe software for clinical note transcription with the patient, who gave verbal consent to proceed.   Patient Profile    Chief Complaint: palpitations  History of Present Illness: KHUSHBOO CHUCK is a 53 y.o. female with PMH notable for Afib, s/p LAAO with Watchman, NICM, HTN, HLD, OSA, obesity s/p gastric bypass; seen today for OLE ONEIDA HOLTS, MD for routine electrophysiology followup.   She is s/p LAAO with Watchman device 02/2022. Follow-up cardiac CT 04/2022 with well-seated device and NOAC was stopped.   She was referred back to Dr. Holts, seen 07/2024 for tachypalpitations with bradycardia too. An updated 2 week zio was ordered.   On follow-up today, she is very concerned about her tachycardia episodes, states that when they end she feels a falling sensation. She is also symptomatic from PVCs.  She reports recurrent falls which are proceeded by dizziness and LH, typically happen with position changes. She recalls a recent episode where she stood to pick up a tray of cookies and syncopized, she verbalizes that she is supposed to stand quickly to ensure she is not dizzy but frequently forgets.   She continues to take lopressor  BID, in the past she was more fatigued with higher doses. She attempts to maintain good hydration. She also has trigeminal neuralgia, neurology trailing multiple medications that are causing significant vomiting.      Arrhythmia/Device History No specialty comments available.    ROS:  Please see the history of present illness. All other systems are reviewed and otherwise negative.    Physical Exam    VS:  BP 118/70 (BP Location: Left Arm, Patient  Position: Sitting, Cuff Size: Normal)   Pulse 76   Ht 5' 8 (1.727 m)   Wt 196 lb 9.6 oz (89.2 kg)   SpO2 97%   BMI 29.89 kg/m  BMI: Body mass index is 29.89 kg/m.           Wt Readings from Last 3 Encounters:  09/23/24 196 lb 9.6 oz (89.2 kg)  08/05/24 200 lb (90.7 kg)  05/22/24 201 lb 6.4 oz (91.4 kg)     GEN- The patient is well appearing, alert and oriented x 3 today.   Lungs- Clear to ausculation bilaterally, normal work of breathing.  Heart- Regular rate and rhythm, no murmurs, rubs or gallops Extremities- No peripheral edema, warm, dry    Studies Reviewed   Previous EP, cardiology notes.    EKG is ordered. Personal review of EKG from today shows:    EKG Interpretation Date/Time:  Wednesday September 23 2024 10:57:18 EST Ventricular Rate:  76 PR Interval:  144 QRS Duration:  72 QT Interval:  382 QTC Calculation: 429 R Axis:   -8  Text Interpretation: Sinus rhythm with sinus arrhythmia with rare Premature ventricular complexes Low voltage QRS Confirmed by Ansar Skoda [id number]) on 09/23/2024 11:00:37 AM    Long term monitor, 08/28/2024 HR 52 to 185, average 84. 13 nonsustained VT, longest 11 beats 5 nonsustained SVT, longest 16 beats Rare supraventricular ectopy. Occasional ventricular ectopy, 4.3% No sustained arrhythmias. No atrial fibrillation.  TTE, 03/10/2024 NORMAL LEFT VENTRICULAR SYSTOLIC FUNCTION WITH MILD LVH  ESTIMATED EF: 50%,  CALC EF(2D): 51%  ELEVATED LA PRESSURES WITH DIASTOLIC DYSFUNCTION (GRADE 2)  NORMAL RIGHT VENTRICULAR SYSTOLIC FUNCTION  VALVULAR REGURGITATION: TRIVIAL AR, MILD MR, No PR, MILD TR  ESTIMATED RVSP: 28 mmHg  NO VALVULAR STENOSIS   Cardiac CT, 05/01/2022 1. There is normal pulmonary vein drainage into the left atrium.  2. The left atrial appendage is successfully occluded with appropriate compression.  3. Coronary calcium  score of 13, which is 87th percentile for age, sex, and race matched control.  Long term monitor,  03/26/2022 HR 43 - 169bpm, average 70 bpm. 5 nonsustained VT, longest 18 beats 4 nonsustained SVT, longest 19 beats Rare supraventricular ectopy. Occasional ventricular ectopy, 2.1%. No sustained arrhythmias. Symptom triggered episodes correspond to sinus rhythm +/- PVC's.   Assessment and Plan     #) Palpitations #) SVT #) bradycardia #) borderline BP #) s/p gastric bypass Long discussion with patient regarding limitations in treating her arrhythmia. Can consider increasing lopressor  dose to reduce PVC and tachyarrhythmia episodes, but that it may exacerbate her symptoms of orthostatic hypotension. Her SVT episodes are not long enough in duration to consider EPS at this time Alternatively, her bradycardia is very minor so do not favor PPM implant at this time It is possible her increased palpitation episodes are related to ongoing vomiting related to trigeminal neuralgia medication adjustments  Encouraged her to increase PO hydration including electrolyte beverages and salty snack to increase BP  #) AFib #) s/p LAAO with Watchman Recent zio monitor without AFib Cardiac CT with well-seated watchman device, no indication for OAC No AF on recent zio monitor       Current medicines are reviewed at length with the patient today.   The patient has concerns regarding her medicines.  The following changes were made today:  none  Labs/ tests ordered today include:  Orders Placed This Encounter  Procedures   EKG 12-Lead     Disposition: Follow up with Dr. Kennyth in 6 months, sooner if needed   Signed, Chantal Needle, NP  09/23/2024  4:04 PM  Electrophysiology [facility]

## 2024-09-23 ENCOUNTER — Ambulatory Visit: Attending: Cardiology | Admitting: Cardiology

## 2024-09-23 ENCOUNTER — Encounter: Payer: Self-pay | Admitting: Cardiology

## 2024-09-23 VITALS — BP 118/70 | HR 76 | Ht 68.0 in | Wt 196.6 lb

## 2024-09-23 DIAGNOSIS — I4891 Unspecified atrial fibrillation: Secondary | ICD-10-CM

## 2024-09-23 DIAGNOSIS — I951 Orthostatic hypotension: Secondary | ICD-10-CM | POA: Diagnosis not present

## 2024-09-23 DIAGNOSIS — I493 Ventricular premature depolarization: Secondary | ICD-10-CM

## 2024-09-23 DIAGNOSIS — I495 Sick sinus syndrome: Secondary | ICD-10-CM | POA: Diagnosis not present

## 2024-09-23 DIAGNOSIS — Z95818 Presence of other cardiac implants and grafts: Secondary | ICD-10-CM | POA: Diagnosis not present

## 2024-09-23 DIAGNOSIS — R002 Palpitations: Secondary | ICD-10-CM | POA: Diagnosis not present

## 2024-09-23 DIAGNOSIS — I471 Supraventricular tachycardia, unspecified: Secondary | ICD-10-CM | POA: Diagnosis not present

## 2024-09-23 NOTE — Patient Instructions (Addendum)
 Medication Instructions:  Your physician recommends that you continue on your current medications as directed. Please refer to the Current Medication list given to you today.  *If you need a refill on your cardiac medications before your next appointment, please call your pharmacy*  Lab Work: No labs ordered today    Testing/Procedures: No test ordered today   Follow-Up: At [facility], you and your health needs are our priority.  As part of our continuing mission to provide you with exceptional heart care, our providers are all part of one team.  This team includes your primary Cardiologist (physician) and Advanced Practice Providers or APPs (Physician Assistants and Nurse Practitioners) who all work together to provide you with the care you need, when you need it.  Your next appointment:   6 month(s)  Provider:      Dr. Fonda Kitty

## 2024-11-06 NOTE — Telephone Encounter (Signed)
 Patient has called in to reschedule her appointments. She requests to be rescheduled either anytime on a Friday, when she is not in class or to schedule on Tues, Wed, or Thursday early morning as she would need to be out of clinic by 11:30 to make it to class on time. 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  She can be reached at [phone]

## 2025-06-08 ENCOUNTER — Ambulatory Visit: Admitting: Dermatology
